# Patient Record
Sex: Female | Born: 1935 | Race: White | Hispanic: No | Smoking: Former smoker
Health system: Southern US, Community
[De-identification: ages and names within clinical notes are randomized; demographics above are authoritative.]

## PROBLEM LIST (undated history)

## (undated) DIAGNOSIS — I10 Essential (primary) hypertension: Secondary | ICD-10-CM

## (undated) DIAGNOSIS — E039 Hypothyroidism, unspecified: Secondary | ICD-10-CM

## (undated) DIAGNOSIS — Z87448 Personal history of other diseases of urinary system: Secondary | ICD-10-CM

## (undated) DIAGNOSIS — E785 Hyperlipidemia, unspecified: Secondary | ICD-10-CM

## (undated) DIAGNOSIS — K219 Gastro-esophageal reflux disease without esophagitis: Secondary | ICD-10-CM

## (undated) DIAGNOSIS — Z9889 Other specified postprocedural states: Secondary | ICD-10-CM

## (undated) DIAGNOSIS — R112 Nausea with vomiting, unspecified: Secondary | ICD-10-CM

## (undated) HISTORY — DX: Essential (primary) hypertension: I10

## (undated) HISTORY — PX: TONSILLECTOMY: SUR1361

## (undated) HISTORY — DX: Gastro-esophageal reflux disease without esophagitis: K21.9

## (undated) HISTORY — DX: Hypothyroidism, unspecified: E03.9

## (undated) HISTORY — DX: Personal history of other diseases of urinary system: Z87.448

## (undated) HISTORY — PX: APPENDECTOMY: SHX54

## (undated) HISTORY — DX: Hyperlipidemia, unspecified: E78.5

## (undated) HISTORY — PX: CHOLECYSTECTOMY: SHX55

## (undated) HISTORY — PX: DILATION AND CURETTAGE OF UTERUS: SHX78

---
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
## 2003-01-09 ENCOUNTER — Emergency Department (HOSPITAL_COMMUNITY): Admission: EM | Admit: 2003-01-09 | Discharge: 2003-01-09 | Payer: Self-pay | Admitting: Emergency Medicine

## 2008-04-21 ENCOUNTER — Ambulatory Visit: Payer: Self-pay | Admitting: Internal Medicine

## 2008-04-21 DIAGNOSIS — E785 Hyperlipidemia, unspecified: Secondary | ICD-10-CM

## 2008-04-21 DIAGNOSIS — E039 Hypothyroidism, unspecified: Secondary | ICD-10-CM

## 2008-04-21 DIAGNOSIS — K219 Gastro-esophageal reflux disease without esophagitis: Secondary | ICD-10-CM | POA: Insufficient documentation

## 2008-08-26 HISTORY — PX: FRACTURE SURGERY: SHX138

## 2008-09-14 ENCOUNTER — Observation Stay (HOSPITAL_COMMUNITY): Admission: EM | Admit: 2008-09-14 | Discharge: 2008-09-15 | Payer: Self-pay | Admitting: Emergency Medicine

## 2009-02-06 ENCOUNTER — Telehealth: Payer: Self-pay | Admitting: Internal Medicine

## 2009-02-17 ENCOUNTER — Ambulatory Visit: Payer: Self-pay | Admitting: Internal Medicine

## 2009-02-17 LAB — CONVERTED CEMR LAB
ALT: 18 units/L (ref 0–35)
Albumin: 3.7 g/dL (ref 3.5–5.2)
BUN: 26 mg/dL — ABNORMAL HIGH (ref 6–23)
Basophils Relative: 0 % (ref 0.0–3.0)
Chloride: 111 meq/L (ref 96–112)
Cholesterol: 165 mg/dL (ref 0–200)
Eosinophils Relative: 1.8 % (ref 0.0–5.0)
HCT: 46.1 % — ABNORMAL HIGH (ref 36.0–46.0)
Hemoglobin: 16.1 g/dL — ABNORMAL HIGH (ref 12.0–15.0)
Lymphs Abs: 1.5 10*3/uL (ref 0.7–4.0)
MCV: 90.1 fL (ref 78.0–100.0)
Monocytes Absolute: 0.5 10*3/uL (ref 0.1–1.0)
Neutro Abs: 4.1 10*3/uL (ref 1.4–7.7)
Platelets: 188 10*3/uL (ref 150.0–400.0)
Potassium: 4.2 meq/L (ref 3.5–5.1)
RBC: 5.11 M/uL (ref 3.87–5.11)
TSH: 1.16 microintl units/mL (ref 0.35–5.50)
Total Protein: 6.6 g/dL (ref 6.0–8.3)
WBC: 6.2 10*3/uL (ref 4.5–10.5)

## 2009-05-13 ENCOUNTER — Telehealth: Payer: Self-pay | Admitting: Internal Medicine

## 2009-10-09 ENCOUNTER — Encounter: Payer: Self-pay | Admitting: Internal Medicine

## 2009-10-12 ENCOUNTER — Ambulatory Visit: Payer: Self-pay | Admitting: Internal Medicine

## 2010-03-11 ENCOUNTER — Ambulatory Visit: Payer: Self-pay | Admitting: Internal Medicine

## 2010-05-19 ENCOUNTER — Ambulatory Visit: Payer: Self-pay | Admitting: Internal Medicine

## 2010-05-19 DIAGNOSIS — N390 Urinary tract infection, site not specified: Secondary | ICD-10-CM | POA: Insufficient documentation

## 2010-05-19 LAB — CONVERTED CEMR LAB
Nitrite: POSITIVE
Specific Gravity, Urine: >=1.03
Urobilinogen, UA: 0.2
WBC Urine, dipstick: NEGATIVE

## 2010-06-14 ENCOUNTER — Telehealth: Payer: Self-pay | Admitting: Internal Medicine

## 2010-06-15 ENCOUNTER — Ambulatory Visit: Payer: Self-pay | Admitting: Internal Medicine

## 2010-06-15 LAB — CONVERTED CEMR LAB
Ketones, urine, test strip: NEGATIVE
Nitrite: NEGATIVE
Protein, U semiquant: 30
Urobilinogen, UA: 0.2

## 2010-06-28 ENCOUNTER — Ambulatory Visit: Payer: Self-pay | Admitting: Internal Medicine

## 2010-06-28 LAB — CONVERTED CEMR LAB
Bilirubin Urine: NEGATIVE
Urobilinogen, UA: 1
pH: 5.5

## 2010-07-27 DIAGNOSIS — Z87448 Personal history of other diseases of urinary system: Secondary | ICD-10-CM

## 2010-07-27 HISTORY — DX: Personal history of other diseases of urinary system: Z87.448

## 2010-10-26 NOTE — Assessment & Plan Note (Signed)
Summary: burning urination//ccm   Vital Signs:  Patient profile:   77 year old female Weight:      132 pounds Temp:     97.5 degrees F oral BP sitting:   160 / 90  (left arm) Cuff size:   regular  Vitals Entered By: Kathrynn Speed CMA (May 19, 2010 10:22 AM) CC: burning urination, for one day, got up three times during the night,src Is Patient Diabetic? No   CC:  burning urination, for one day, got up three times during the night, and src.  History of Present Illness: a 77 year old patient who presents with a two day history of burning, dysuria and frequency.  Symptoms actually are improved today.  She has had some lower mid abdominal discomfort, however, she will be leaving for a trip to [place] soon  and was concerned about a urinary tract infection. She has treated hypertension, dyslipidemia, and hypothyroidism, which have been stable  Current Medications (verified): 1)  Synthroid 112 Mcg  Tabs (Levothyroxine Sodium) .Marland Kitchen.. 1 Once Daily 2)  Simvastatin 10 Mg Tabs (Simvastatin) .... One Daily 3)  Diovan 320 Mg Tabs (Valsartan) .... One Half Tablet Daily 4)  Omeprazole 20 Mg Cpdr (Omeprazole) .... One Daily  Allergies (verified): No Known Drug Allergies  Past History:  Past Medical History: Reviewed history from 04/21/2008 and no changes required. GERD Hyperlipidemia Hypertension Hypothyroidism  Past Surgical History: Appendectomy age 21 Oophorectomy 1954 Tonsillectomy at 10 Cholecystectomy age 32 multiple D&Cs  surgery for fracture, left wrist December 2009 colonoscopy  2006  Social History: Reviewed history from 04/21/2008 and no changes required. widowed August 2011 3 adult children  Review of Systems       The patient complains of depression.  The patient denies anorexia, fever, weight loss, weight gain, vision loss, decreased hearing, hoarseness, chest pain, syncope, dyspnea on exertion, peripheral edema, prolonged cough, headaches, hemoptysis,  abdominal pain, melena, hematochezia, severe indigestion/heartburn, hematuria, incontinence, genital sores, muscle weakness, suspicious skin lesions, transient blindness, difficulty walking, unusual weight change, abnormal bleeding, enlarged lymph nodes, angioedema, and breast masses.    Physical Exam  General:  Well-developed,well-nourished,in no acute distress; alert,appropriate and cooperative throughout examination; 140/80 Head:  Normocephalic and atraumatic without obvious abnormalities. No apparent alopecia or balding. Mouth:  Oral mucosa and oropharynx without lesions or exudates.  Teeth in good repair. Neck:  No deformities, masses, or tenderness noted. Lungs:  Normal respiratory effort, chest expands symmetrically. Lungs are clear to auscultation, no crackles or wheezes. Heart:  Normal rate and regular rhythm. S1 and S2 normal without gallop, murmur, click, rub or other extra sounds. Abdomen:  mild suprapubic tenderness Msk:  No deformity or scoliosis noted of thoracic or lumbar spine.   Pulses:  R and L carotid,radial,femoral,dorsalis pedis and posterior tibial pulses are full and equal bilaterally Extremities:  No clubbing, cyanosis, edema, or deformity noted with normal full range of motion of all joints.     Impression & Recommendations:  Problem # 1:  UTI (ICD-599.0)  Her updated medication list for this problem includes:    Ciprofloxacin Hcl 500 Mg Tabs (Ciprofloxacin hcl) ..... One twice daily  Problem # 2:  HYPERTENSION (ICD-401.9)  Her updated medication list for this problem includes:    Diovan 320 Mg Tabs (Valsartan) ..... One half tablet daily  Complete Medication List: 1)  Synthroid 112 Mcg Tabs (Levothyroxine sodium) .Marland Kitchen.. 1 once daily 2)  Simvastatin 10 Mg Tabs (Simvastatin) .... One daily 3)  Diovan 320 Mg Tabs (Valsartan) .Marland KitchenMarland KitchenMarland Kitchen  One half tablet daily 4)  Omeprazole 20 Mg Cpdr (Omeprazole) .... One daily 5)  Ciprofloxacin Hcl 500 Mg Tabs (Ciprofloxacin hcl) ....  One twice daily  Patient Instructions: 1)  Please schedule a follow-up appointment in 3 months for CPX 2)  Advised not to eat any food or drink any liquids after 10 PM the night before your procedure. 3)  Limit your Sodium (Salt) to less than 2 grams a day(slightly less than 1/2 a teaspoon) to prevent fluid retention, swelling, or worsening of symptoms. 4)  It is important that you exercise regularly at least 20 minutes 5 times a week. If you develop chest pain, have severe difficulty breathing, or feel very tired , stop exercising immediately and seek medical attention. Prescriptions: CIPROFLOXACIN HCL 500 MG TABS (CIPROFLOXACIN HCL) one twice daily  #10 x 0   Entered and Authorized by:   Gordy Savers  MD   Signed by:   Gordy Savers  MD on 05/19/2010   Method used:   Print then Give to Patient   RxID:   [id number]   Laboratory Results   Urine Tests  Date/Time Received: May 19, 2010  Date/Time Reported: May 19, 2010   Routine Urinalysis   Color: yellow Appearance: Clear Glucose: negative   (Normal Range: Negative) Bilirubin: negative   (Normal Range: Negative) Ketone: negative   (Normal Range: Negative) Spec. Gravity: >=1.030   (Normal Range: 1.003-1.035) Blood: small   (Normal Range: Negative) pH: 5.0   (Normal Range: 5.0-8.0) Protein: negative   (Normal Range: Negative) Urobilinogen: 0.2   (Normal Range: 0-1) Nitrite: positive   (Normal Range: Negative) Leukocyte Esterace: negative   (Normal Range: Negative)    Comments: Kathrynn Speed CMA  May 19, 2010 10:58 AM

## 2010-10-26 NOTE — Assessment & Plan Note (Signed)
Summary: fu on meds/njr   Vital Signs:  Woods profile:   77 year old female Weight:      136 pounds BMI:     22.03 Temp:     97.6 degrees F oral BP sitting:   112 / 66  (left arm) Cuff size:   regular  Vitals Entered By: Raechel Ache, RN (October 12, 2009 1:09 PM) CC: ROV   CC:  ROV.  History of Present Illness: Dawn Woods who is seen today for follow-up of her hypertension.  She has dyslipidemia and hypothyroidism.  She is doing quite well.  No concerns or complaints.  She denies any cardiopulmonary complaints  Allergies: No Known Drug Allergies  Past History:  Past Medical History: Reviewed history from 04/21/2008 and no changes required. GERD Hyperlipidemia Hypertension Hypothyroidism  Past Surgical History: Reviewed history from 02/17/2009 and no changes required. Appendectomy age 60 Oophorectomy 1954 Tonsillectomy at 55 Cholecystectomy age 66 multiple D&Cs  surgery for fracture, left wrist December 2009 coloscopy 2006  Family History: Reviewed history from 04/21/2008 and no changes required. father died age 23 M. I. mother died age 46  pneumonia  One sister, age 18.  thyroid disease  Review of Systems  The Woods denies anorexia, fever, weight loss, weight gain, vision loss, decreased hearing, hoarseness, chest pain, syncope, dyspnea on exertion, peripheral edema, prolonged cough, headaches, hemoptysis, abdominal pain, melena, hematochezia, severe indigestion/heartburn, hematuria, incontinence, genital sores, muscle weakness, suspicious skin lesions, transient blindness, difficulty walking, depression, unusual weight change, abnormal bleeding, enlarged lymph nodes, angioedema, and breast masses.    Physical Exam  General:  Well-developed,well-nourished,in no acute distress; alert,appropriate and cooperative throughout examination Head:  Normocephalic and atraumatic without obvious abnormalities. No apparent alopecia or balding. Eyes:  No  corneal or conjunctival inflammation noted. EOMI. Perrla. Funduscopic exam benign, without hemorrhages, exudates or papilledema. Vision grossly normal. Mouth:  Oral mucosa and oropharynx without lesions or exudates.  Teeth in good repair. Neck:  No deformities, masses, or tenderness noted. Lungs:  Normal respiratory effort, chest expands symmetrically. Lungs are clear to auscultation, no crackles or wheezes. Heart:  Normal rate and regular rhythm. S1 and S2 normal without gallop, murmur, click, rub or other extra sounds. Abdomen:  Bowel sounds positive,abdomen soft and non-tender without masses, organomegaly or hernias noted.   Impression & Recommendations:  Problem # 1:  HYPOTHYROIDISM (ICD-244.9)  Her updated medication list for this problem includes:    Synthroid 112 Mcg Tabs (Levothyroxine sodium) .Marland Kitchen... 1 once daily  Her updated medication list for this problem includes:    Synthroid 112 Mcg Tabs (Levothyroxine sodium) .Marland Kitchen... 1 once daily  Problem # 2:  HYPERTENSION (ICD-401.9)  The following medications were removed from the medication list:    Diovan 160 Mg Tabs (Valsartan) ..... One daily. Her updated medication list for this problem includes:    Diovan 320 Mg Tabs (Valsartan) ..... One half tablet daily  Her updated medication list for this problem includes:    Diovan 160 Mg Tabs (Valsartan) ..... One daily.  Orders: Prescription Created Electronically [id number])  Problem # 3:  HYPERLIPIDEMIA (ICD-272.4)  Her updated medication list for this problem includes:    Simvastatin 10 Mg Tabs (Simvastatin) ..... One daily  Her updated medication list for this problem includes:    Simvastatin 10 Mg Tabs (Simvastatin) ..... One daily  Complete Medication List: 1)  Synthroid 112 Mcg Tabs (Levothyroxine sodium) .Marland Kitchen.. 1 once daily 2)  Simvastatin 10 Mg Tabs (Simvastatin) .... One daily 3)  Diovan 320 Mg Tabs (Valsartan) .... One half tablet daily  [facility] Instructions: 1)  Please  schedule a follow-up appointment in 6 months. 2)  Limit your Sodium (Salt). 3)  It is important that you exercise regularly at least 20 minutes 5 times a week. If you develop chest pain, have severe difficulty breathing, or feel very tired , stop exercising immediately and seek medical attention. Prescriptions: SIMVASTATIN 10 MG TABS (SIMVASTATIN) one daily  #90 x 6   Entered and Authorized by:   Gordy Savers  MD   Signed by:   Gordy Savers  MD on 10/12/2009   Method used:   Print then Give to Woods   RxID:   [id number] DIOVAN 320 MG TABS (VALSARTAN) one half tablet daily  #90 x 6   Entered and Authorized by:   Gordy Savers  MD   Signed by:   Gordy Savers  MD on 10/12/2009   Method used:   Print then Give to Woods   RxID:   [id number] SYNTHROID 112 MCG  TABS (LEVOTHYROXINE SODIUM) 1 once daily  #90 x 6   Entered and Authorized by:   Gordy Savers  MD   Signed by:   Gordy Savers  MD on 10/12/2009   Method used:   Print then Give to Woods   RxID:   [id number] DIOVAN 320 MG TABS (VALSARTAN) one half tablet daily  #90 x 6   Entered and Authorized by:   Gordy Savers  MD   Signed by:   Gordy Savers  MD on 10/12/2009   Method used:   Electronically to        [facility]* (mail-order)             ,          Ph: [phone]       Fax: [phone]   RxID:   [id number] SIMVASTATIN 10 MG TABS (SIMVASTATIN) one daily  #90 x 6   Entered and Authorized by:   Gordy Savers  MD   Signed by:   Gordy Savers  MD on 10/12/2009   Method used:   Electronically to        [facility]* (mail-order)             ,          Ph: [phone]       Fax: [phone]   RxID:   [id number] SYNTHROID 112 MCG  TABS (LEVOTHYROXINE SODIUM) 1 once daily  #90 x 6   Entered and Authorized by:   Gordy Savers  MD   Signed by:   Gordy Savers  MD on 10/12/2009   Method used:   Electronically to         [facility]* (mail-order)             ,          Ph: [phone]       Fax: [phone]   RxID:   [id number]

## 2010-10-26 NOTE — Assessment & Plan Note (Signed)
Summary: ?bladder inf/headache/cjr   Vital Signs:  Patient profile:   77 year old female Weight:      130 pounds BP sitting:   118 / 80  (left arm) Cuff size:   regular  Vitals Entered By: Duard Brady LPN (June 15, 2010 9:06 AM) CC: c/o buring with urination Is Patient Diabetic? No   CC:  c/o buring with urination.  History of Present Illness: 77 year old patient who presents with a 4-day history of burning, dysuria, and some frequency.  Yesterday, she noted blood-tinged urine.  She has had urinary tract infections in the past.  At the present time.  She denies any dysuria.  A UA was reviewed today that revealed moderate blood, but no leukocytes.  She was treated last month with Cipro, which caused hives.  She has treated hypertension, which has been stable  Allergies: 1)  ! Cipro  Past History:  Past Medical History: Reviewed history from 04/21/2008 and no changes required. GERD Hyperlipidemia Hypertension Hypothyroidism  Physical Exam  General:  Well-developed,well-nourished,in no acute distress; alert,appropriate and cooperative throughout examination Abdomen:  Bowel sounds positive,abdomen soft and non-tender without masses, organomegaly or hernias noted.   Impression & Recommendations:  Problem # 1:  UTI (ICD-599.0)  The following medications were removed from the medication list:    Ciprofloxacin Hcl 500 Mg Tabs (Ciprofloxacin hcl) ..... One twice daily Her updated medication list for this problem includes:    Nitrofurantoin Monohyd Macro 100 Mg Caps (Nitrofurantoin monohyd macro) ..... One twice daily suspect the hematuria is related to a hemorrhagic cystitis ; will treat with antibiotic therapy and asked the patient to return in 4 weeks to document the resolution of hematuria  Problem # 2:  HYPERTENSION (ICD-401.9)  Her updated medication list for this problem includes:    Diovan 320 Mg Tabs (Valsartan) ..... One half tablet daily  Her updated  medication list for this problem includes:    Diovan 320 Mg Tabs (Valsartan) ..... One half tablet daily  Complete Medication List: 1)  Synthroid 112 Mcg Tabs (Levothyroxine sodium) .Marland Kitchen.. 1 once daily 2)  Simvastatin 10 Mg Tabs (Simvastatin) .... One daily 3)  Diovan 320 Mg Tabs (Valsartan) .... One half tablet daily 4)  Omeprazole 20 Mg Cpdr (Omeprazole) .... One daily 5)  Nitrofurantoin Monohyd Macro 100 Mg Caps (Nitrofurantoin monohyd macro) .... One twice daily  Other Orders: UA Dipstick w/o Micro (manual) ([id number])  Patient Instructions: 1)  Take your antibiotic as prescribed until ALL of it is gone, but stop if you develop a rash or swelling and contact our office as soon as possible. 2)  recheck a urinalysis in 4 weeks to document clearance of the hematuria Prescriptions: NITROFURANTOIN MONOHYD MACRO 100 MG CAPS (NITROFURANTOIN MONOHYD MACRO) one twice daily  #20 x 0   Entered and Authorized by:   Gordy Savers  MD   Signed by:   Gordy Savers  MD on 06/15/2010   Method used:   Print then Give to Patient   RxID:   [id number]   Laboratory Results   Urine Tests  Date/Time Received: June 15, 2010 9:21 AM  Date/Time Reported: June 15, 2010 9:21 AM   Routine Urinalysis   Color: yellow Appearance: Hazy Glucose: negative   (Normal Range: Negative) Bilirubin: negative   (Normal Range: Negative) Ketone: negative   (Normal Range: Negative) Spec. Gravity: >=1.030   (Normal Range: 1.003-1.035) Blood: moderate   (Normal Range: Negative) pH: 5.0   (Normal Range: 5.0-8.0) Protein: 30   (  Normal Range: Negative) Urobilinogen: 0.2   (Normal Range: 0-1) Nitrite: negative   (Normal Range: Negative) Leukocyte Esterace: negative   (Normal Range: Negative)

## 2010-10-26 NOTE — Letter (Signed)
Summary: Request for Medical Alert Certificate/[employer]  Request for Medical Alert Certificate/[employer]   Imported By: Maryln Gottron 10/12/2009 15:45:41  _____________________________________________________________________  External Attachment:    Type:   Image     Comment:   External Document

## 2010-10-26 NOTE — Assessment & Plan Note (Signed)
Summary: body aches//ccm   Vital Signs:  Patient profile:   77 year old female Weight:      132 pounds Temp:     98.1 degrees F oral BP sitting:   110 / 70  (right arm) Cuff size:   regular  Vitals Entered By: Duard Brady LPN (March 11, 2010 12:49 PM) CC: c/o upper body pain Is Patient Diabetic? No   CC:  c/o upper body pain.  History of Present Illness: 77 year old patient who states she has a history of gastroesophageal reflux disease.  Approximately 3 weeks ago, she states that following  digestion.  The A. large quantity of pain that she began having reflux symptoms.  She describes upper chest and back pain.  She states that after ingestion of especially vegetables.  She has dyspepsia associated with considerable belching.  There's been no nausea or vomiting.  She has cardiac risk factors, but no history of exertional chest pain  Allergies (verified): No Known Drug Allergies  Past History:  Past Medical History: Reviewed history from 04/21/2008 and no changes required. GERD Hyperlipidemia Hypertension Hypothyroidism  Review of Systems       The patient complains of severe indigestion/heartburn.  The patient denies anorexia, fever, weight loss, weight gain, vision loss, decreased hearing, hoarseness, chest pain, syncope, dyspnea on exertion, peripheral edema, prolonged cough, headaches, hemoptysis, abdominal pain, melena, hematochezia, hematuria, incontinence, genital sores, muscle weakness, suspicious skin lesions, transient blindness, difficulty walking, depression, unusual weight change, abnormal bleeding, enlarged lymph nodes, angioedema, and breast masses.    Physical Exam  General:  Well-developed,well-nourished,in no acute distress; alert,appropriate and cooperative throughout examination Head:  Normocephalic and atraumatic without obvious abnormalities. No apparent alopecia or balding. Mouth:  Oral mucosa and oropharynx without lesions or exudates.  Teeth in  good repair. Neck:  No deformities, masses, or tenderness noted. Lungs:  Normal respiratory effort, chest expands symmetrically. Lungs are clear to auscultation, no crackles or wheezes. Heart:  Normal rate and regular rhythm. S1 and S2 normal without gallop, murmur, click, rub or other extra sounds. Abdomen:  Bowel sounds positive,abdomen soft and non-tender without masses, organomegaly or hernias noted.   Impression & Recommendations:  Problem # 1:  GERD (ICD-530.81)  Her updated medication list for this problem includes:    Omeprazole 20 Mg Cpdr (Omeprazole) ..... One daily  Problem # 2:  HYPERLIPIDEMIA (ICD-272.4)  Her updated medication list for this problem includes:    Simvastatin 10 Mg Tabs (Simvastatin) ..... One daily  Complete Medication List: 1)  Synthroid 112 Mcg Tabs (Levothyroxine sodium) .Marland Kitchen.. 1 once daily 2)  Simvastatin 10 Mg Tabs (Simvastatin) .... One daily 3)  Diovan 320 Mg Tabs (Valsartan) .... One half tablet daily 4)  Omeprazole 20 Mg Cpdr (Omeprazole) .... One daily  Patient Instructions: 1)  Please schedule a follow-up appointment in 3 months. 2)  Avoid foods high in acid (tomatoes, citrus juices, spicy foods). Avoid eating within two hours of lying down or before exercising. Do not over eat; try smaller more frequent meals. Elevate head of bed twelve inches when sleeping. Prescriptions: OMEPRAZOLE 20 MG CPDR (OMEPRAZOLE) one daily  #90 x 3   Entered and Authorized by:   Gordy Savers  MD   Signed by:   Gordy Savers  MD on 03/11/2010   Method used:   Electronically to        [facility]* (retail)             ,  Ph: [phone]       Fax: [phone]   RxID:   [id number] DIOVAN 320 MG TABS (VALSARTAN) one half tablet daily  #90 x 6   Entered and Authorized by:   Gordy Savers  MD   Signed by:   Gordy Savers  MD on 03/11/2010   Method used:   Electronically to        [facility]* (retail)             ,           Ph: [phone]       Fax: [phone]   RxID:   [id number] SIMVASTATIN 10 MG TABS (SIMVASTATIN) one daily  #90 x 6   Entered and Authorized by:   Gordy Savers  MD   Signed by:   Gordy Savers  MD on 03/11/2010   Method used:   Electronically to        [facility]* (retail)             ,          Ph: [phone]       Fax: [phone]   RxID:   [id number] SYNTHROID 112 MCG  TABS (LEVOTHYROXINE SODIUM) 1 once daily  #90 x 6   Entered and Authorized by:   Gordy Savers  MD   Signed by:   Gordy Savers  MD on 03/11/2010   Method used:   Electronically to        [facility]* (retail)             ,          Ph: [phone]       Fax: [phone]   RxID:   [id number]

## 2010-10-26 NOTE — Assessment & Plan Note (Signed)
Summary: UA/RCD//per kim resched to dr. K/cb   Vital Signs:  Patient profile:   77 year old female Weight:      129 pounds BP sitting:   120 / 74  (right arm) Cuff size:   large  Vitals Entered By: Duard Brady LPN (June 28, 2010 9:24 AM)  History of Present Illness: 77 year old patient who is seen today for follow-up of an episode of gross hematuria  in the setting of a recent urinary tract infection.  She has had no further dysuria or hematuria.  She feels well today.  She was seen today for a follow-up UA reveals perhaps trace hematuria.  She has treated hypertension.  she wishes to have her Diovan prescription changed to 160 mg, so she doesn't have to break the 320 mg and half.  Allergies: 1)  ! Cipro  Past History:  Past Medical History: GERD Hyperlipidemia Hypertension Hypothyroidism history of hematuria September 2011  Review of Systems  The patient denies anorexia, fever, weight loss, weight gain, vision loss, decreased hearing, hoarseness, chest pain, syncope, dyspnea on exertion, peripheral edema, prolonged cough, headaches, hemoptysis, abdominal pain, melena, hematochezia, severe indigestion/heartburn, hematuria, incontinence, genital sores, muscle weakness, suspicious skin lesions, transient blindness, difficulty walking, depression, unusual weight change, abnormal bleeding, enlarged lymph nodes, angioedema, and breast masses.    Physical Exam  General:  Well-developed,well-nourished,in no acute distress; alert,appropriate and cooperative throughout examination; blood   pressure 120/74   Impression & Recommendations:  Problem # 1:  HYPERTENSION (ICD-401.9)  The following medications were removed from the medication list:    Diovan 320 Mg Tabs (Valsartan) ..... One half tablet daily Her updated medication list for this problem includes:    Diovan 160 Mg Tabs (Valsartan) ..... One daily  The following medications were removed from the medication list:    Diovan 320 Mg Tabs (Valsartan) ..... One half tablet daily Her updated medication list for this problem includes:    Diovan 160 Mg Tabs (Valsartan) ..... One daily  Problem # 2:  HEMATURIA, HX OF (ICD-V13.09)  Complete Medication List: 1)  Synthroid 112 Mcg Tabs (Levothyroxine sodium) .Marland Kitchen.. 1 once daily 2)  Simvastatin 10 Mg Tabs (Simvastatin) .... One daily 3)  Omeprazole 20 Mg Cpdr (Omeprazole) .... One daily 4)  Nitrofurantoin Monohyd Macro 100 Mg Caps (Nitrofurantoin monohyd macro) .... One twice daily 5)  Diovan 160 Mg Tabs (Valsartan) .... One daily  Patient Instructions: 1)  Please schedule a follow-up appointment in 3 months. 2)  Limit your Sodium (Salt) to less than 2 grams a day(slightly less than 1/2 a teaspoon) to prevent fluid retention, swelling, or worsening of symptoms. 3)  It is important that you exercise regularly at least 20 minutes 5 times a week. If you develop chest pain, have severe difficulty breathing, or feel very tired , stop exercising immediately and seek medical attention. Prescriptions: DIOVAN 160 MG TABS (VALSARTAN) one daily  #90 x 6   Entered and Authorized by:   Gordy Savers  MD   Signed by:   Gordy Savers  MD on 06/28/2010   Method used:   Electronically to        [facility]* (retail)             ,          Ph: [phone]       Fax: [phone]   RxID:   [id number]   Laboratory Results   Urine Tests    Routine Urinalysis  Color: yellow Appearance: Clear Glucose: negative   (Normal Range: Negative) Bilirubin: negative   (Normal Range: Negative) Ketone: trace (5)   (Normal Range: Negative) Spec. Gravity: 1.025   (Normal Range: 1.003-1.035) Blood: trace-intact   (Normal Range: Negative) pH: 5.5   (Normal Range: 5.0-8.0) Protein: trace   (Normal Range: Negative) Urobilinogen: 1.0   (Normal Range: 0-1) Nitrite: negative   (Normal Range: Negative) Leukocyte Esterace: negative   (Normal Range: Negative)      Comments: Rita Ohara  June 28, 2010 9:19 AM

## 2010-10-26 NOTE — Progress Notes (Signed)
Summary: Call-A-Nurse Report    Call-A-Nurse Triage Call Report Triage Record Num: [id number] Operator: Revonda Humphrey Patient Name: Dawn Woods Call Date & Time: 06/12/2010 9:34:29AM Patient Phone: [phone] PCP: Patient Gender: Female PCP Fax : Patient DOB: 1930/02/11 [facility] Name: [facility] Reason for Call: Patient calling about urine Sx, started pain with urination last night 06/11/10, burning with urination. Afebrile. "Thinks UTI". Declines appt at [facility], referred to [facility] Gave care information. Protocol(s) Used: Urinary Symptoms - Female Recommended Outcome per Protocol: See Anderson Coppock within 24 hours Reason for Outcome: Has one or more urinary tract symptoms Care Advice:  ~ Call Toddrick Sanna if you develop flank or low back pain, fever, generally feel sick. Increase intake of fluids. Try to drink 8 oz. (.2 liter) every hour when awake, including unsweetened cranberry juice, unless on restricted fluids for other medical reasons. Take sips of fluid or eat ice chips if nauseated or vomiting.  ~ Limit carbonated, alcoholic, and caffeinated beverages such as coffee, tea and soda. Avoid nonprescription cold and allergy medications that contain caffeine. Limit intake of tomatoes, fruit juices (except for unsweetened cranberry juice), dairy products, spicy foods, sugar, and artificial sweeteners (aspartame or saccharine). Stop or decrease smoking. Reducing exposure to bladder irritants may help lessen urgency.  ~  ~ Call Artin Mceuen if urine is pink, red, smoky or cola colored. Systemic Inflammatory Response Syndrome (SIRS): Watch for signs of a generalized, whole body infection. Occurs within days of a localized infection, especially of the urinary, GI, respiratory or nervous systems; or after a traumatic injury or invasive procedure. - Call EMS 911 if symptoms have worsened, such as increasing confusion or unusual drowsiness; cold and clammy skin; no urine output;  rapid respiration (>30/min.) or slow respiration (<10/min.); struggling to breathe. - Go to the ED immediately for early symptoms of rapid pulse >90/min. or rapid breathing >20/min. at rest; chills; oral temperature >100.4 F (38 C) or <96.8 F (36 C) when associated with conditions noted.  ~ 06/12/2010 9:45:58AM Page 1 of 1 [id number]

## 2010-11-15 ENCOUNTER — Encounter: Payer: Self-pay | Admitting: Internal Medicine

## 2010-11-16 ENCOUNTER — Encounter: Payer: Self-pay | Admitting: Internal Medicine

## 2010-11-16 ENCOUNTER — Ambulatory Visit (INDEPENDENT_AMBULATORY_CARE_PROVIDER_SITE_OTHER): Payer: 59 | Admitting: Internal Medicine

## 2010-11-16 DIAGNOSIS — R197 Diarrhea, unspecified: Secondary | ICD-10-CM

## 2010-11-16 DIAGNOSIS — I10 Essential (primary) hypertension: Secondary | ICD-10-CM

## 2010-11-16 DIAGNOSIS — E785 Hyperlipidemia, unspecified: Secondary | ICD-10-CM

## 2010-11-16 MED ORDER — DIPHENOXYLATE-ATROPINE 2.5-0.025 MG PO TABS: 1.0000 | ORAL_TABLET | Freq: Four times a day (QID) | ORAL | Status: AC | PRN

## 2010-11-16 NOTE — Patient Instructions (Signed)
Call or return to clinic prn if these symptoms worsen or fail to improve as anticipated.   consider high fiber diet

## 2010-11-16 NOTE — Progress Notes (Signed)
  Subjective:    Patient ID: Dawn Woods, female    DOB: [birth year], 77 y.o.   MRN: [id number]  HPI  77 year old patient, who presents with a two week history of loose diarrheal stool.  These occur 3 to 4 times daily, but are not associated with incontinence.  The patient has tried Imodium A-D without much benefit.  There is no anorexia, nausea or vomiting, or abdominal pain.  There's been no distention.  Denies any fever    Review of Systems  Constitutional: Negative.   HENT: Negative for hearing loss, congestion, sore throat, rhinorrhea, dental problem, sinus pressure and tinnitus.   Eyes: Negative for pain, discharge and visual disturbance.  Respiratory: Negative for cough and shortness of breath.   Cardiovascular: Negative for chest pain, palpitations and leg swelling.  Gastrointestinal: Positive for diarrhea. Negative for nausea, vomiting, abdominal pain, constipation, blood in stool and abdominal distention.  Genitourinary: Negative for dysuria, urgency, frequency, hematuria, flank pain, vaginal bleeding, vaginal discharge, difficulty urinating, vaginal pain and pelvic pain.  Musculoskeletal: Negative for joint swelling, arthralgias and gait problem.  Skin: Negative for rash.  Neurological: Negative for dizziness, syncope, speech difficulty, weakness, numbness and headaches.  Hematological: Negative for adenopathy.  Psychiatric/Behavioral: Negative for behavioral problems, dysphoric mood and agitation. The patient is not nervous/anxious.        Objective:   Physical Exam  Constitutional: She is oriented to person, place, and time. She appears well-developed and well-nourished.  HENT:  Head: Normocephalic.  Right Ear: External ear normal.  Left Ear: External ear normal.  Mouth/Throat: Oropharynx is clear and moist.  Eyes: Conjunctivae and EOM are normal. Pupils are equal, round, and reactive to light.  Neck: Normal range of motion. Neck supple. No thyromegaly present.    Cardiovascular: Normal rate, regular rhythm, normal heart sounds and intact distal pulses.   Pulmonary/Chest: Effort normal and breath sounds normal.  Abdominal: Soft. Bowel sounds are normal. She exhibits no mass. There is no tenderness. There is no rebound and no guarding.  Musculoskeletal: Normal range of motion.  Lymphadenopathy:    She has no cervical adenopathy.  Neurological: She is alert and oriented to person, place, and time.  Skin: Skin is warm and dry. No rash noted.  Psychiatric: She has a normal mood and affect. Her behavior is normal.          Assessment & Plan:  Diarrhea-  Will treat with Align and clinically observed.  She was also given a small dose of an antidiarrheal.  I suggested a high fiber diet

## 2011-02-08 NOTE — Op Note (Signed)
Dawn Woods, Dawn Woods           ACCOUNT NO.:  [id number]   MEDICAL RECORD NO.:  [id number]          PATIENT TYPE:  INP   LOCATION:  0107                         FACILITY:  [facility]   PHYSICIAN:  Madelynn Done, MD  DATE OF BIRTH:  November 30, 1929   DATE OF PROCEDURE:  09/14/2008  DATE OF DISCHARGE:                               OPERATIVE REPORT   PREOPERATIVE DIAGNOSIS:  Left wrist intra-articular distal radius  fracture, 3 or more fragments.   POSTOPERATIVE DIAGNOSES:  1. Tobacco use.  2. Osteoporosis.  3. Fall.  4. Left wrist intra-articular distal radius fracture, 3 or more      fragments.   ATTENDING SURGEON:  Madelynn Done, MD, who was scrubbed and present  the entire procedure.   ASSISTANT SURGEON:  None.   SURGICAL PROCEDURE:  1. Open treatment of left wrist intra-articular distal radius fracture      of 3 or more fragments.  2. Stress radiography, 3 views x-ray of left wrist.   SURGICAL IMPLANTS:  1. [employer] volar distal radius plate, with 7 locking pegs      distally and 3 bicortical screws proximally.  2. Orthoblast 5 mL DBX putty.   INTRAOPERATIVE FINDINGS:  The patient did have a comminuted intra-  articular fracture of the distal radius.  She did have a large  metaphyseal void; therefore the bone graft substitute was used.  The  patient did not have any instability to her distal radial ulnar joint or  any intercarpal widening after fixation of the distal radius.   INDICATIONS:  Ms. Scarfone is a 77 year old female who fell while  taking out the trash; she sustained a closed injury to her left distal  radius.  The patient was seen and evaluated in the emergency department.  She underwent a closed manipulation and continued to have displacement  following closed methods.  After options were discussed with her, we  elected to proceed with the above procedure.  The risks, benefits and  alternatives were discussed in detail with the patient.  A  signed  informed consent was obtained.  The risks include, but not limited to:  bleeding, infection, damage to nearby nerves, tendons and arteries;  nonunion, malunion, reaction to the implant, loss of motion of the wrist  and forearm, and possible need for further surgical intervention.  All  questions were addressed prior to surgery.   DESCRIPTION OF PROCEDURE:  The patient was brought up and identified in  the preoperative holding area; made a mark on the left upper extremity  to indicate the correct operative site.  The patient was then brought  back to the operating room, placed supine on the anesthesia table --  there general anesthesia was administered via endotracheal tube.  The  patient tolerated this well.  The patient had undergone also axillary  block anesthesia performed by Dr. Quentin Cornwall. Denenny prior to the  induction of anesthesia.  The patient tolerated this well.  A well-  padded tourniquet was then placed on the left brachium and sealed with  1000 drape.  The left upper extremity was then prepped with  Betadine and  sterilely draped.  A timeout was called; correct side was then  identified and the procedure was then begun.   The limb was then elevated using Esmarch exsanguination with tourniquet  inflated to 250 mmHg.  The patient received preoperative antibiotics  prior to any skin incision.   A longitudinal incision was then made directly over the FCR tendon  sheath.  Dissection was carried down through the skin and subcutaneous  tissues.  Hemostasis was obtained with bipolar cautery.  The FCR sheath  was then opened proximally and distally.  The FPL was then identified.  The pronator quadratus was then elevated in an L-shaped fashion; this  exposed the fracture site.  There was an intra-articular extension both  volarly and dorsally.  There were 3 or more fragments.   An open reduction was then performed.  The patient did have a large  cortical window of bone which  was able to be elevated; and therefore the  bone graft substitute was then packed from a volar to dorsal direction,  with good fill in the metaphyseal region.  After placement of the  Orthoblast bone graft substitute, an open reduction was then performed.  The volar plate was then applied and then affixed with the oblong screw  hole proximally.  The plate was then adjusted to appropriate height and  width.  Following application of the plate, a mini C-arm was then used  to confirm appropriate alignment of the fracture site as well as the  plate.   Attention was then turned distally.  The distal fixation was then begun,  beginning in an ulnar-to-radial direction using a locking peg awl.  Smooth locking pegs were then used at the appropriate drill guide and  depth gauge measurement.  After the distal fixation, attention was then  turned proximally; where 2 more bicortical screws were then placed with  good purchase.  After the implant was then passed into the radius,  stress radiography was then carried out in AP, lateral and oblique  images; as well as stressing the wrist joint, to make sure there was not  any intercarpal widening or instability to the distal radioulnar joint.   The wounds were then thoroughly irrigated.  The pronator quadratus flap  was then repaired with 2-0 Vicryl suture.  The tourniquet was deflated  and hemostasis then obtained with saline irrigation and direct pressure.  Subcutaneous tissues were closed with 4-0 Vicryl and the skin closed  with a horizontal running 4-0 nylon suture.  Adaptic dressing was then  applied and a sterile compressive dressing was then applied.  The  patient was then placed in a well-padded sugar-tong splint.  She was  extubated, taken to recovery room in good condition.   Intraoperative radiograph reviews of the wrist did show the internal  fixation and plate to have good position of both the distal radius and  distal radial ulnar joint.   Good position of the plate and reduction of  the fracture site.   POSTOPERATIVE PLAN:  The patient will be admitted overnight for IV  antibiotic and pain control.  She will be discharged to home.  She is to  see me back in the office in 10 days for wound check and suture removal;  then x-rays out of the splint and then transitioned to a short-arm cast  for a total of 4 weeks immobilization.  X-rays at the 4-week mark, 6-  week mark, and then begin likely therapy at anywhere at the  6-week mark  -- depending on fracture healing.  We may consider, depending on the  initial radiographs, long-arm immobilization for 3 weeks.      Madelynn Done, MD  Electronically Signed     FWO/MEDQ  D:  09/14/2008  T:  09/15/2008  Job:  [id number]

## 2011-04-06 ENCOUNTER — Ambulatory Visit (INDEPENDENT_AMBULATORY_CARE_PROVIDER_SITE_OTHER): Payer: 59 | Admitting: Internal Medicine

## 2011-04-06 ENCOUNTER — Encounter: Payer: Self-pay | Admitting: Internal Medicine

## 2011-04-06 DIAGNOSIS — I10 Essential (primary) hypertension: Secondary | ICD-10-CM

## 2011-04-06 DIAGNOSIS — E785 Hyperlipidemia, unspecified: Secondary | ICD-10-CM

## 2011-04-06 DIAGNOSIS — E039 Hypothyroidism, unspecified: Secondary | ICD-10-CM

## 2011-04-06 DIAGNOSIS — K219 Gastro-esophageal reflux disease without esophagitis: Secondary | ICD-10-CM

## 2011-04-06 LAB — CBC WITH DIFFERENTIAL/PLATELET
Basophils Relative: 0.5 % (ref 0.0–3.0)
Eosinophils Relative: 0.9 % (ref 0.0–5.0)
HCT: 46.9 % — ABNORMAL HIGH (ref 36.0–46.0)
Hemoglobin: 15.9 g/dL — ABNORMAL HIGH (ref 12.0–15.0)
Lymphs Abs: 1.5 10*3/uL (ref 0.7–4.0)
MCV: 90.7 fl (ref 78.0–100.0)
Monocytes Absolute: 0.5 10*3/uL (ref 0.1–1.0)
Monocytes Relative: 6 % (ref 3.0–12.0)
Neutro Abs: 5.5 10*3/uL (ref 1.4–7.7)
RBC: 5.18 Mil/uL — ABNORMAL HIGH (ref 3.87–5.11)
WBC: 7.6 10*3/uL (ref 4.5–10.5)

## 2011-04-06 LAB — BASIC METABOLIC PANEL
BUN: 31 mg/dL — ABNORMAL HIGH (ref 6–23)
Chloride: 108 mEq/L (ref 96–112)
GFR: 80.43 mL/min (ref >60.00)
Potassium: 4.1 mEq/L (ref 3.5–5.1)
Sodium: 141 mEq/L (ref 135–145)

## 2011-04-06 LAB — TSH: TSH: 0.3 u[IU]/mL — ABNORMAL LOW (ref 0.35–5.50)

## 2011-04-06 LAB — LIPID PANEL
Cholesterol: 169 mg/dL (ref 0–200)
LDL Cholesterol: 79 mg/dL (ref 0–99)
Total CHOL/HDL Ratio: 2
VLDL: 19.2 mg/dL (ref 0.0–40.0)

## 2011-04-06 LAB — HEPATIC FUNCTION PANEL
ALT: 14 U/L (ref 0–35)
AST: 22 U/L (ref 0–37)
Alkaline Phosphatase: 75 U/L (ref 39–117)
Total Bilirubin: 1 mg/dL (ref 0.3–1.2)

## 2011-04-06 MED ORDER — VALSARTAN 160 MG PO TABS: 160.0000 mg | ORAL_TABLET | Freq: Every day | ORAL | Status: DC

## 2011-04-06 NOTE — Patient Instructions (Addendum)
Limit your sodium (Salt) intake  Return in 3 months for follow-up  Call for earlier visit and  chest x-ray if weight loss continues

## 2011-04-06 NOTE — Progress Notes (Signed)
  Subjective:    Patient ID: Dawn Woods, female    DOB: [birth year], 77 y.o.   MRN: [id number]  HPI  Wt Readings from Last 3 Encounters:  04/06/11 123 lb (55.792 kg)  11/16/10 128 lb (58.06 kg)  06/28/10 129 lb (58.67 kg)   77 year old patient who is seen today for followup. She has a history of treated hypertension which has been well on Diovan. She has hypothyroidism. Since her last visit here a number of months ago her weight is down 5 pounds. She does smoke cigarettes about 5 per day. She has gastroesophageal reflux disease which has been stable  In general she feels quite well. She is seen here at the insistence of her daughter said she has not been evaluated in some time. She describes a normal appetite. She has had no recent lab  Review of Systems  Constitutional: Positive for unexpected weight change.  HENT: Negative for hearing loss, congestion, sore throat, rhinorrhea, dental problem, sinus pressure and tinnitus.   Eyes: Negative for pain, discharge and visual disturbance.  Respiratory: Negative for cough and shortness of breath.   Cardiovascular: Negative for chest pain, palpitations and leg swelling.  Gastrointestinal: Negative for nausea, vomiting, abdominal pain, diarrhea, constipation, blood in stool and abdominal distention.  Genitourinary: Negative for dysuria, urgency, frequency, hematuria, flank pain, vaginal bleeding, vaginal discharge, difficulty urinating, vaginal pain and pelvic pain.  Musculoskeletal: Negative for joint swelling, arthralgias and gait problem.  Skin: Negative for rash.  Neurological: Negative for dizziness, syncope, speech difficulty, weakness, numbness and headaches.  Hematological: Negative for adenopathy.  Psychiatric/Behavioral: Negative for behavioral problems, dysphoric mood and agitation. The patient is not nervous/anxious.        Objective:   Physical Exam  Constitutional: She is oriented to person, place, and time. She appears  well-developed and well-nourished.  HENT:  Head: Normocephalic.  Right Ear: External ear normal.  Left Ear: External ear normal.  Mouth/Throat: Oropharynx is clear and moist.  Eyes: Conjunctivae and EOM are normal. Pupils are equal, round, and reactive to light.  Neck: Normal range of motion. Neck supple. No thyromegaly present.  Cardiovascular: Normal rate, regular rhythm, normal heart sounds and intact distal pulses.   Pulmonary/Chest: Effort normal and breath sounds normal.  Abdominal: Soft. Bowel sounds are normal. She exhibits no mass. There is no tenderness.  Musculoskeletal: Normal range of motion.  Lymphadenopathy:    She has no cervical adenopathy.  Neurological: She is alert and oriented to person, place, and time.  Skin: Skin is warm and dry. No rash noted.  Psychiatric: She has a normal mood and affect. Her behavior is normal.          Assessment & Plan:   Hypertension. Well controlled. Will refill Diovan Mod[place] weight loss. We'll check some updated lab and schedule a complete physical. If weight loss continues will obtain a chest x-ray Gastroesophageal reflux disease Hypothyroidism. We'll check a TSH

## 2011-04-08 ENCOUNTER — Other Ambulatory Visit: Payer: Self-pay

## 2011-04-08 MED ORDER — LEVOTHYROXINE SODIUM 75 MCG PO TABS: 75.0000 ug | ORAL_TABLET | Freq: Every day | ORAL | Status: DC

## 2011-04-08 NOTE — Telephone Encounter (Signed)
Pt aware of new rx for synthroid - short term to [facility] and 90 day to [facility]

## 2011-04-08 NOTE — Progress Notes (Signed)
Quick Note:  Pt aware new med to start. Short tern called to [facility] and 90 day to [facility] ______

## 2011-06-13 ENCOUNTER — Other Ambulatory Visit: Payer: Self-pay | Admitting: Internal Medicine

## 2011-06-14 ENCOUNTER — Encounter: Payer: Self-pay | Admitting: Internal Medicine

## 2011-06-14 ENCOUNTER — Ambulatory Visit (INDEPENDENT_AMBULATORY_CARE_PROVIDER_SITE_OTHER): Payer: 59 | Admitting: Internal Medicine

## 2011-06-14 VITALS — BP 132/80

## 2011-06-14 DIAGNOSIS — J069 Acute upper respiratory infection, unspecified: Secondary | ICD-10-CM

## 2011-06-14 DIAGNOSIS — E785 Hyperlipidemia, unspecified: Secondary | ICD-10-CM

## 2011-06-14 DIAGNOSIS — I10 Essential (primary) hypertension: Secondary | ICD-10-CM

## 2011-06-14 NOTE — Progress Notes (Signed)
  Subjective:    Patient ID: Dawn Woods, female    DOB: Sep 26, 1930, 77 y.o.   MRN: [id number]  HPI  77 year old patient who is seen today with a three-day history of head and chest congestion. There's been no purulent sputum production wheezing or shortness of breath. She does have a history of ongoing low volume tobacco use. She is making an effort at smoking cessation. She is concerned about the possible need for antibiotic therapy. She has hypertension and dyslipidemia which has been stable denies any exertional chest pain.    Review of Systems  Constitutional: Negative.   HENT: Positive for congestion and rhinorrhea. Negative for hearing loss, sore throat, dental problem, sinus pressure and tinnitus.   Eyes: Negative for pain, discharge and visual disturbance.  Respiratory: Positive for cough. Negative for shortness of breath.   Cardiovascular: Negative for chest pain, palpitations and leg swelling.  Gastrointestinal: Negative for nausea, vomiting, abdominal pain, diarrhea, constipation, blood in stool and abdominal distention.  Genitourinary: Negative for dysuria, urgency, frequency, hematuria, flank pain, vaginal bleeding, vaginal discharge, difficulty urinating, vaginal pain and pelvic pain.  Musculoskeletal: Negative for joint swelling, arthralgias and gait problem.  Skin: Negative for rash.  Neurological: Negative for dizziness, syncope, speech difficulty, weakness, numbness and headaches.  Hematological: Negative for adenopathy.  Psychiatric/Behavioral: Negative for behavioral problems, dysphoric mood and agitation. The patient is not nervous/anxious.        Objective:   Physical Exam  Constitutional: She is oriented to person, place, and time. She appears well-developed and well-nourished.  HENT:  Head: Normocephalic.  Right Ear: External ear normal.  Left Ear: External ear normal.  Mouth/Throat: Oropharynx is clear and moist.  Eyes: Conjunctivae and EOM are normal.  Pupils are equal, round, and reactive to light.  Neck: Normal range of motion. Neck supple. No thyromegaly present.  Cardiovascular: Normal rate, regular rhythm, normal heart sounds and intact distal pulses.   Pulmonary/Chest: Effort normal and breath sounds normal.  Abdominal: Soft. Bowel sounds are normal. She exhibits no mass. There is no tenderness.  Musculoskeletal: Normal range of motion.  Lymphadenopathy:    She has no cervical adenopathy.  Neurological: She is alert and oriented to person, place, and time.  Skin: Skin is warm and dry. No rash noted.  Psychiatric: She has a normal mood and affect. Her behavior is normal.          Assessment & Plan:    Viral URI. Will treat symptomatically with Mucinex DM. Total cessation of smoking encouraged Hypertension well controlled. We'll continue present regimen Dyslipidemia. Will continue present regimen  Low-salt diet recommended total cessation tobacco products recommended will see in 3 months we'll check a TSH at that time

## 2011-06-14 NOTE — Patient Instructions (Signed)
Get plenty of rest, Drink lots of  clear liquids, and use Tylenol or ibuprofen for fever and discomfort.    Smoking tobacco is very bad for your health. You should stop smoking immediately.  Return in 3 months for follow-up

## 2011-07-01 LAB — CBC
HCT: 46.1 % — ABNORMAL HIGH (ref 36.0–46.0)
MCHC: 33.2 g/dL (ref 30.0–36.0)
MCV: 91.9 fL (ref 78.0–100.0)
Platelets: 214 10*3/uL (ref 150–400)
WBC: 9.9 10*3/uL (ref 4.0–10.5)

## 2011-07-01 LAB — DIFFERENTIAL
Basophils Relative: 0 % (ref 0–1)
Eosinophils Absolute: 0 10*3/uL (ref 0.0–0.7)
Eosinophils Relative: 0 % (ref 0–5)
Lymphs Abs: 0.9 10*3/uL (ref 0.7–4.0)
Monocytes Relative: 4 % (ref 3–12)
Neutrophils Relative %: 87 % — ABNORMAL HIGH (ref 43–77)

## 2011-07-01 LAB — BASIC METABOLIC PANEL
BUN: 25 mg/dL — ABNORMAL HIGH (ref 6–23)
CO2: 28 mEq/L (ref 19–32)
Chloride: 105 mEq/L (ref 96–112)
Creatinine, Ser: 0.67 mg/dL (ref 0.4–1.2)
Potassium: 4.7 mEq/L (ref 3.5–5.1)

## 2011-08-24 ENCOUNTER — Other Ambulatory Visit: Payer: Self-pay | Admitting: Internal Medicine

## 2011-08-24 MED ORDER — LEVOTHYROXINE SODIUM 75 MCG PO TABS: 75.0000 ug | ORAL_TABLET | Freq: Every day | ORAL | Status: DC

## 2011-08-24 NOTE — Telephone Encounter (Signed)
Short term rx sent to [facility] - pt aware

## 2011-08-24 NOTE — Telephone Encounter (Signed)
Refill Synthroid to [facility]. [facility] told her the her next refill in on 09-02-2011, but she only has 7 pills left. Please call [facility]. Please advise of meds. Thanks.

## 2011-09-12 ENCOUNTER — Encounter: Payer: Self-pay | Admitting: Internal Medicine

## 2011-09-12 ENCOUNTER — Ambulatory Visit (INDEPENDENT_AMBULATORY_CARE_PROVIDER_SITE_OTHER): Payer: Medicare Other | Admitting: Internal Medicine

## 2011-09-12 DIAGNOSIS — E039 Hypothyroidism, unspecified: Secondary | ICD-10-CM

## 2011-09-12 DIAGNOSIS — E785 Hyperlipidemia, unspecified: Secondary | ICD-10-CM

## 2011-09-12 DIAGNOSIS — I10 Essential (primary) hypertension: Secondary | ICD-10-CM

## 2011-09-12 NOTE — Patient Instructions (Signed)
Smoking tobacco is very bad for your health. You should stop smoking immediately.  Please check your blood pressure on a regular basis.  If it is consistently greater than 150/90, please make an office appointment.  Limit your sodium (Salt) intake

## 2011-09-12 NOTE — Progress Notes (Signed)
  Subjective:    Patient ID: Dawn Woods, female    DOB: Dec 29, 1929, 77 y.o.   MRN: [id number]  HPI  77 year old patient who is seen today for her six-month followup. Her Synthroid dose was adjusted in July. She was said down titrated slightly. She has dyslipidemia hypertension ongoing tobacco use. She feels well today. Her weight is unchanged    Review of Systems  Constitutional: Negative.   HENT: Negative for hearing loss, congestion, sore throat, rhinorrhea, dental problem, sinus pressure and tinnitus.   Eyes: Negative for pain, discharge and visual disturbance.  Respiratory: Negative for cough and shortness of breath.   Cardiovascular: Negative for chest pain, palpitations and leg swelling.  Gastrointestinal: Negative for nausea, vomiting, abdominal pain, diarrhea, constipation, blood in stool and abdominal distention.  Genitourinary: Negative for dysuria, urgency, frequency, hematuria, flank pain, vaginal bleeding, vaginal discharge, difficulty urinating, vaginal pain and pelvic pain.  Musculoskeletal: Negative for joint swelling, arthralgias and gait problem.  Skin: Negative for rash.  Neurological: Negative for dizziness, syncope, speech difficulty, weakness, numbness and headaches.  Hematological: Negative for adenopathy.  Psychiatric/Behavioral: Negative for behavioral problems, dysphoric mood and agitation. The patient is not nervous/anxious.        Objective:   Physical Exam  Constitutional: She is oriented to person, place, and time. She appears well-developed and well-nourished.  HENT:  Head: Normocephalic.  Right Ear: External ear normal.  Left Ear: External ear normal.  Mouth/Throat: Oropharynx is clear and moist.  Eyes: Conjunctivae and EOM are normal. Pupils are equal, round, and reactive to light.  Neck: Normal range of motion. Neck supple. No thyromegaly present.  Cardiovascular: Normal rate, regular rhythm, normal heart sounds and intact distal pulses.     Pulmonary/Chest: Effort normal and breath sounds normal.  Abdominal: Soft. Bowel sounds are normal. She exhibits no mass. There is no tenderness.  Musculoskeletal: Normal range of motion.  Lymphadenopathy:    She has no cervical adenopathy.  Neurological: She is alert and oriented to person, place, and time.  Skin: Skin is warm and dry. No rash noted.  Psychiatric: She has a normal mood and affect. Her behavior is normal.          Assessment & Plan:   Hypothyroidism. Hypertension stable Gastroesophageal reflux disease Dyslipidemia  We'll recheck in July for her annual exam

## 2011-12-12 ENCOUNTER — Ambulatory Visit (INDEPENDENT_AMBULATORY_CARE_PROVIDER_SITE_OTHER): Payer: Medicare Other | Admitting: Internal Medicine

## 2011-12-12 ENCOUNTER — Encounter: Payer: Self-pay | Admitting: Internal Medicine

## 2011-12-12 VITALS — BP 140/70 | Temp 97.4°F | Wt 130.0 lb

## 2011-12-12 DIAGNOSIS — I10 Essential (primary) hypertension: Secondary | ICD-10-CM | POA: Diagnosis not present

## 2011-12-12 DIAGNOSIS — R300 Dysuria: Secondary | ICD-10-CM

## 2011-12-12 DIAGNOSIS — N390 Urinary tract infection, site not specified: Secondary | ICD-10-CM | POA: Diagnosis not present

## 2011-12-12 DIAGNOSIS — C44211 Basal cell carcinoma of skin of unspecified ear and external auricular canal: Secondary | ICD-10-CM | POA: Diagnosis not present

## 2011-12-12 LAB — POCT URINALYSIS DIPSTICK
Ketones, UA: NEGATIVE
Protein, UA: 100
Spec Grav, UA: 1.025
Urobilinogen, UA: 0.2
pH, UA: 6.5

## 2011-12-12 MED ORDER — TRIMETHOPRIM 100 MG PO TABS: 100.0000 mg | ORAL_TABLET | Freq: Two times a day (BID) | ORAL | Status: AC

## 2011-12-12 NOTE — Progress Notes (Signed)
  Subjective:    Patient ID: Dawn Woods, female    DOB: 02/07/1930, 78 y.o.   MRN: [id number]  HPI  78 year old patient who presents with a chief complaint of burning dysuria and frequency for 2 days duration. She has a intolerance to ciprofloxacin which has caused pruritus in the past She has a history of hypertension and ongoing tobacco use.    Review of Systems  Constitutional: Negative.   HENT: Negative for hearing loss, congestion, sore throat, rhinorrhea, dental problem, sinus pressure and tinnitus.   Eyes: Negative for pain, discharge and visual disturbance.  Respiratory: Negative for cough and shortness of breath.   Cardiovascular: Negative for chest pain, palpitations and leg swelling.  Gastrointestinal: Negative for nausea, vomiting, abdominal pain, diarrhea, constipation, blood in stool and abdominal distention.  Genitourinary: Positive for dysuria and frequency. Negative for urgency, hematuria, flank pain, vaginal bleeding, vaginal discharge, difficulty urinating, vaginal pain and pelvic pain.  Musculoskeletal: Negative for joint swelling, arthralgias and gait problem.  Skin: Negative for rash.  Neurological: Negative for dizziness, syncope, speech difficulty, weakness, numbness and headaches.  Hematological: Negative for adenopathy.  Psychiatric/Behavioral: Negative for behavioral problems, dysphoric mood and agitation. The patient is not nervous/anxious.        Objective:   Physical Exam  Constitutional:       Blood pressure 130/70  Pulmonary/Chest:       Few coarse rhonchi  Skin:       A 3- 4 mm helix left ear worrisome for basal cell cancer          Assessment & Plan:   UTI. Will treat with  Trimpex Hypertension stable Rule out BCE  left helix

## 2011-12-12 NOTE — Patient Instructions (Signed)
Take your antibiotic as prescribed until ALL of it is gone, but stop if you develop a rash, swelling, or any side effects of the medication.  Contact our office as soon as possible if  there are side effects of the medication.  Dermatology followup as discussed  Annual exam as scheduled

## 2011-12-22 DIAGNOSIS — B351 Tinea unguium: Secondary | ICD-10-CM | POA: Diagnosis not present

## 2011-12-22 DIAGNOSIS — M79609 Pain in unspecified limb: Secondary | ICD-10-CM | POA: Diagnosis not present

## 2011-12-22 DIAGNOSIS — I70209 Unspecified atherosclerosis of native arteries of extremities, unspecified extremity: Secondary | ICD-10-CM | POA: Diagnosis not present

## 2011-12-22 DIAGNOSIS — L988 Other specified disorders of the skin and subcutaneous tissue: Secondary | ICD-10-CM | POA: Diagnosis not present

## 2011-12-22 DIAGNOSIS — L851 Acquired keratosis [keratoderma] palmaris et plantaris: Secondary | ICD-10-CM | POA: Diagnosis not present

## 2011-12-26 ENCOUNTER — Telehealth: Payer: Self-pay | Admitting: Internal Medicine

## 2011-12-26 NOTE — Telephone Encounter (Signed)
Please schedule office visit this week and suggest daughter accompany patient on the visit

## 2011-12-26 NOTE — Telephone Encounter (Signed)
Please advise 

## 2011-12-26 NOTE — Telephone Encounter (Signed)
Spoke with debra - mom out of town - will be back next week - appt made for wed. 4/10

## 2011-12-26 NOTE — Telephone Encounter (Signed)
Pts daughter called and said that pt was just seen for uti. Pt now has swollen feet and circulatory problems. Req to get a referral to Vascular Surgeon.   Also need to know when pt had last HGA1C done.

## 2012-01-04 ENCOUNTER — Ambulatory Visit (INDEPENDENT_AMBULATORY_CARE_PROVIDER_SITE_OTHER): Payer: Medicare Other | Admitting: Internal Medicine

## 2012-01-04 ENCOUNTER — Encounter: Payer: Self-pay | Admitting: Internal Medicine

## 2012-01-04 VITALS — BP 126/84 | Temp 97.7°F | Wt 130.0 lb

## 2012-01-04 DIAGNOSIS — E785 Hyperlipidemia, unspecified: Secondary | ICD-10-CM

## 2012-01-04 DIAGNOSIS — Z720 Tobacco use: Secondary | ICD-10-CM | POA: Insufficient documentation

## 2012-01-04 DIAGNOSIS — I771 Stricture of artery: Secondary | ICD-10-CM

## 2012-01-04 DIAGNOSIS — I10 Essential (primary) hypertension: Secondary | ICD-10-CM

## 2012-01-04 DIAGNOSIS — H61009 Unspecified perichondritis of external ear, unspecified ear: Secondary | ICD-10-CM | POA: Diagnosis not present

## 2012-01-04 DIAGNOSIS — E039 Hypothyroidism, unspecified: Secondary | ICD-10-CM

## 2012-01-04 DIAGNOSIS — F17200 Nicotine dependence, unspecified, uncomplicated: Secondary | ICD-10-CM | POA: Diagnosis not present

## 2012-01-04 DIAGNOSIS — D485 Neoplasm of uncertain behavior of skin: Secondary | ICD-10-CM | POA: Diagnosis not present

## 2012-01-04 DIAGNOSIS — L919 Hypertrophic disorder of the skin, unspecified: Secondary | ICD-10-CM | POA: Diagnosis not present

## 2012-01-04 HISTORY — DX: Tobacco use: Z72.0

## 2012-01-04 NOTE — Progress Notes (Signed)
  Subjective:    Patient ID: Dawn Woods, female    DOB: Feb 17, 1930, 78 y.o.   MRN: [id number]  HPI  Wt Readings from Last 3 Encounters:  01/04/12 130 lb (58.968 kg)  12/12/11 130 lb (58.968 kg)  09/12/11 127 lb (57.607 kg)    Review of Systems     Objective:   Physical Exam        Assessment & Plan:

## 2012-01-04 NOTE — Patient Instructions (Signed)
Avoid trauma to the feet  Smoking tobacco is very bad for your health. You should stop smoking immediately.  Arterial Doppler evaluation as discussed  Moisturizing cream to both feet twice daily  Call if any clinical worsening  Return in 2 weeks for followup

## 2012-01-04 NOTE — Progress Notes (Signed)
Subjective:    Patient ID: Dawn Woods, female    DOB: March 16, 1930, 78 y.o.   MRN: [id number]  HPI  78 year old patient who is seen today because of family's concern about poor circulation to the feet.  Cardiovascular risk factors include hypertension dyslipidemia and ongoing tobacco use. She smokes approximately 5 cigarettes daily. The patient is unclear how long she has had difficulty with her feet. She is accompanied by a daughter who states that she had no problems with her feet last summer.  Family has noted cyanotic toes with a dry flaky rash involving all digits    Review of Systems  Constitutional: Negative.   HENT: Negative for hearing loss, congestion, sore throat, rhinorrhea, dental problem, sinus pressure and tinnitus.   Eyes: Negative for pain, discharge and visual disturbance.  Respiratory: Negative for cough and shortness of breath.   Cardiovascular: Negative for chest pain, palpitations and leg swelling.  Gastrointestinal: Negative for nausea, vomiting, abdominal pain, diarrhea, constipation, blood in stool and abdominal distention.  Genitourinary: Negative for dysuria, urgency, frequency, hematuria, flank pain, vaginal bleeding, vaginal discharge, difficulty urinating, vaginal pain and pelvic pain.  Musculoskeletal: Negative for joint swelling, arthralgias and gait problem (denies claudication).  Skin: Positive for rash.  Neurological: Negative for dizziness, syncope, speech difficulty, weakness, numbness and headaches.  Hematological: Negative for adenopathy.  Psychiatric/Behavioral: Negative for behavioral problems, dysphoric mood and agitation. The patient is not nervous/anxious.        Objective:   Physical Exam  Constitutional: She is oriented to person, place, and time. She appears well-developed and well-nourished.       Blood pressure normal  HENT:  Head: Normocephalic.  Right Ear: External ear normal.  Left Ear: External ear normal.  Mouth/Throat:  Oropharynx is clear and moist.  Eyes: Conjunctivae and EOM are normal. Pupils are equal, round, and reactive to light.  Neck: Normal range of motion. Neck supple. No thyromegaly present.  Cardiovascular: Normal rate, regular rhythm and normal heart sounds.        Femoral pulses appear to be normal without bruits Right pedal pulses were intact Left pedal pulses were diminished and  the left dorsalis pedis pulse not definitely palpable  Pulmonary/Chest: Effort normal and breath sounds normal.       O2 saturation 97%  Abdominal: Soft. Bowel sounds are normal. She exhibits no mass. There is no tenderness.  Musculoskeletal: Normal range of motion.  Lymphadenopathy:    She has no cervical adenopathy.  Neurological: She is alert and oriented to person, place, and time.  Skin: Skin is warm and dry. No rash noted.       All 10 toes were slightly cyanotic but did blanch with pressure; toes are quite dry and flaky. Multiple areas appeared to be consistent with tiny ischemic ulcerations  Psychiatric: She has a normal mood and affect. Her behavior is normal.          Assessment & Plan:   Patient presents with a dermatitis of both feet of unclear duration; she denies any claudication. Her only complaint is some pain involving the right heel related to some recent trauma. Pedal pulses were clearly palpable on the right and the foot dermatitis was perhaps slightly worse on the right. Still concerned about arterial insufficiency involving more distal digital arteries. Local wound care discussed;  she'll avoid trauma and use a moisturizing cream. She'll be placed on aspirin 81 mg daily and continue aggressive risk factor modification with lipid and blood pressure control. Total cessation of  smoking encouraged;  we'll set up for arterial Doppler evaluation Hypertension stable Dyslipidemia Ongoing tobacco use

## 2012-01-05 ENCOUNTER — Encounter (INDEPENDENT_AMBULATORY_CARE_PROVIDER_SITE_OTHER): Payer: Medicare Other

## 2012-01-05 DIAGNOSIS — L97909 Non-pressure chronic ulcer of unspecified part of unspecified lower leg with unspecified severity: Secondary | ICD-10-CM | POA: Diagnosis not present

## 2012-01-05 DIAGNOSIS — I771 Stricture of artery: Secondary | ICD-10-CM

## 2012-01-12 NOTE — Progress Notes (Signed)
Quick Note:  Spoke with pt - informed normal ______ 

## 2012-01-18 ENCOUNTER — Encounter: Payer: Self-pay | Admitting: Internal Medicine

## 2012-01-18 ENCOUNTER — Ambulatory Visit (INDEPENDENT_AMBULATORY_CARE_PROVIDER_SITE_OTHER): Payer: Medicare Other | Admitting: Internal Medicine

## 2012-01-18 VITALS — BP 140/80 | Wt 132.0 lb

## 2012-01-18 DIAGNOSIS — F17200 Nicotine dependence, unspecified, uncomplicated: Secondary | ICD-10-CM | POA: Diagnosis not present

## 2012-01-18 DIAGNOSIS — I10 Essential (primary) hypertension: Secondary | ICD-10-CM

## 2012-01-18 DIAGNOSIS — Z720 Tobacco use: Secondary | ICD-10-CM

## 2012-01-18 DIAGNOSIS — E785 Hyperlipidemia, unspecified: Secondary | ICD-10-CM

## 2012-01-18 NOTE — Progress Notes (Signed)
  Subjective:    Patient ID: Dawn Woods, female    DOB: [birth year], 78 y.o.   MRN: [id number]  HPI  78 year old patient seen today in followup. She presented 2 weeks ago with a history of cyanosis involving the toes. She states that her toes are normal in the morning but later in the day become blue and cyanotic. There's been no pain clinical exam was unremarkable but the patient did have lower extremities arterial Dopplers performed with normal ABIs. Only complaint today is right heel pain that seems to be improving. She has dyslipidemia hypertension and ongoing tobacco use   Review of Systems  Constitutional: Negative.   HENT: Negative for hearing loss, congestion, sore throat, rhinorrhea, dental problem, sinus pressure and tinnitus.   Eyes: Negative for pain, discharge and visual disturbance.  Respiratory: Negative for cough and shortness of breath.   Cardiovascular: Negative for chest pain, palpitations and leg swelling.  Gastrointestinal: Negative for nausea, vomiting, abdominal pain, diarrhea, constipation, blood in stool and abdominal distention.  Genitourinary: Negative for dysuria, urgency, frequency, hematuria, flank pain, vaginal bleeding, vaginal discharge, difficulty urinating, vaginal pain and pelvic pain.  Musculoskeletal: Negative for joint swelling, arthralgias (right heel pain) and gait problem.  Skin: Negative for rash.  Neurological: Negative for dizziness, syncope, speech difficulty, weakness, numbness and headaches.  Hematological: Negative for adenopathy.  Psychiatric/Behavioral: Negative for behavioral problems, dysphoric mood and agitation. The patient is not nervous/anxious.        Objective:   Physical Exam  Constitutional: She is oriented to person, place, and time. She appears well-developed and well-nourished. No distress.  HENT:  Head: Normocephalic.  Right Ear: External ear normal.  Left Ear: External ear normal.  Eyes: Conjunctivae and EOM are  normal. Pupils are equal, round, and reactive to light.  Neck: Normal range of motion. Neck supple. No thyromegaly present.  Cardiovascular: Normal rate, regular rhythm, normal heart sounds and intact distal pulses.        The left dorsalis pedis pulse diminished Toes were cyanotic but blanched  Pulmonary/Chest: Effort normal and breath sounds normal.       O2 saturation 96  Abdominal: She exhibits no mass. There is no tenderness.  Lymphadenopathy:    She has no cervical adenopathy.  Neurological: She is alert and oriented to person, place, and time.  Skin: Skin is warm and dry. No rash noted.       Cemented right heel revealed some dry thickened skin with some fissuring. No signs of active inflammation  Psychiatric: She has a normal mood and affect. Her behavior is normal.          Assessment & Plan:   Hypertension stable Dyslipidemia Ongoing tobacco use  No evidence of significant large vessel peripheral vascular disease. Total cessation of smoking encouraged Recheck 6 months

## 2012-01-18 NOTE — Patient Instructions (Signed)
Smoking tobacco is very bad for your health. You should stop smoking immediately.  Limit your sodium (Salt) intake  Return in 6 months for follow-up   

## 2012-05-07 ENCOUNTER — Other Ambulatory Visit: Payer: Self-pay | Admitting: Internal Medicine

## 2012-05-22 ENCOUNTER — Other Ambulatory Visit: Payer: Self-pay | Admitting: Internal Medicine

## 2012-07-24 ENCOUNTER — Ambulatory Visit (INDEPENDENT_AMBULATORY_CARE_PROVIDER_SITE_OTHER): Payer: Medicare Other | Admitting: Internal Medicine

## 2012-07-24 ENCOUNTER — Encounter: Payer: Self-pay | Admitting: Internal Medicine

## 2012-07-24 VITALS — BP 146/90 | Temp 98.0°F | Wt 127.0 lb

## 2012-07-24 DIAGNOSIS — E785 Hyperlipidemia, unspecified: Secondary | ICD-10-CM | POA: Diagnosis not present

## 2012-07-24 DIAGNOSIS — F17200 Nicotine dependence, unspecified, uncomplicated: Secondary | ICD-10-CM

## 2012-07-24 DIAGNOSIS — Z720 Tobacco use: Secondary | ICD-10-CM

## 2012-07-24 DIAGNOSIS — K219 Gastro-esophageal reflux disease without esophagitis: Secondary | ICD-10-CM | POA: Diagnosis not present

## 2012-07-24 DIAGNOSIS — E039 Hypothyroidism, unspecified: Secondary | ICD-10-CM | POA: Diagnosis not present

## 2012-07-24 DIAGNOSIS — I10 Essential (primary) hypertension: Secondary | ICD-10-CM | POA: Diagnosis not present

## 2012-07-24 MED ORDER — SIMVASTATIN 10 MG PO TABS: 10.0000 mg | ORAL_TABLET | Freq: Every day | ORAL | Status: DC

## 2012-07-24 MED ORDER — LEVOTHYROXINE SODIUM 75 MCG PO TABS: 75.0000 ug | ORAL_TABLET | Freq: Every day | ORAL | Status: DC

## 2012-07-24 NOTE — Progress Notes (Signed)
Subjective:    Patient ID: Dawn Woods, female    DOB: August 24, 1930, 78 y.o.   MRN: [id number]  HPI  78 year old patient who is seen today for followup. She has a history of treated hypothyroidism as well as dyslipidemia that has been treated with simvastatin. She is doing quite well today and denies any cardiopulmonary complaints. She has treated hypertension and a history of ongoing tobacco use. She is using omeprazole for gastroesophageal reflux disease  Past Medical History  Diagnosis Date  . GERD (gastroesophageal reflux disease)   . Hyperlipidemia   . Hypertension   . Hypothyroidism   . H/O: hematuria 07/2010    History   Social History  . Marital Status: Widowed    Spouse Name: N/A    Number of Children: 3  . Years of Education: N/A   Occupational History  . Not on file.   Social History Main Topics  . Smoking status: Current Every Day Smoker -- 0.3 packs/day    Types: Cigarettes  . Smokeless tobacco: Never Used  . Alcohol Use: No  . Drug Use: No  . Sexually Active: Not on file   Other Topics Concern  . Not on file   Social History Narrative  . No narrative on file    Past Surgical History  Procedure Date  . Appendectomy     age 78  . Oophorectomy 1954  . Tonsillectomy     age 78  . Cholecystectomy     age 78  . Dilation and curettage of uterus     multiple's  . Fracture surgery Dec 2009    left wrist    Family History  Problem Relation Age of Onset  . Thyroid disease Sister 11    Allergies  Allergen Reactions  . Ciprofloxacin     REACTION: hives    Current Outpatient Prescriptions on File Prior to Visit  Medication Sig Dispense Refill  . DIOVAN 160 MG tablet TAKE 1 TABLET DAILY  90 tablet  3  . omeprazole (PRILOSEC) 20 MG capsule TAKE 1 CAPSULE DAILY  90 capsule  3  . DISCONTD: levothyroxine (SYNTHROID) 75 MCG tablet Take 1 tablet (75 mcg total) by mouth daily.  30 tablet  0  . DISCONTD: levothyroxine (SYNTHROID, LEVOTHROID) 75 MCG  tablet TAKE 1 TABLET DAILY  90 tablet  2  . DISCONTD: simvastatin (ZOCOR) 10 MG tablet TAKE 1 TABLET DAILY  90 tablet  3    BP 146/90  Temp 98 F (36.7 C) (Oral)  Wt 127 lb (57.607 kg)       Review of Systems  Constitutional: Negative.   HENT: Negative for hearing loss, congestion, sore throat, rhinorrhea, dental problem, sinus pressure and tinnitus.   Eyes: Negative for pain, discharge and visual disturbance.  Respiratory: Negative for cough and shortness of breath.   Cardiovascular: Negative for chest pain, palpitations and leg swelling.  Gastrointestinal: Negative for nausea, vomiting, abdominal pain, diarrhea, constipation, blood in stool and abdominal distention.  Genitourinary: Negative for dysuria, urgency, frequency, hematuria, flank pain, vaginal bleeding, vaginal discharge, difficulty urinating, vaginal pain and pelvic pain.  Musculoskeletal: Negative for joint swelling, arthralgias and gait problem.  Skin: Negative for rash.  Neurological: Negative for dizziness, syncope, speech difficulty, weakness, numbness and headaches.  Hematological: Negative for adenopathy.  Psychiatric/Behavioral: Negative for behavioral problems, dysphoric mood and agitation. The patient is not nervous/anxious.        Objective:   Physical Exam  Constitutional: She is oriented to person, place, and time.  She appears well-developed and well-nourished.  HENT:  Head: Normocephalic.  Right Ear: External ear normal.  Left Ear: External ear normal.  Mouth/Throat: Oropharynx is clear and moist.  Eyes: Conjunctivae normal and EOM are normal. Pupils are equal, round, and reactive to light.  Neck: Normal range of motion. Neck supple. No thyromegaly present.  Cardiovascular: Normal rate, regular rhythm, normal heart sounds and intact distal pulses.   Pulmonary/Chest: Effort normal and breath sounds normal.  Abdominal: Soft. Bowel sounds are normal. She exhibits no mass. There is no tenderness.    Musculoskeletal: Normal range of motion.  Lymphadenopathy:    She has no cervical adenopathy.  Neurological: She is alert and oriented to person, place, and time.  Skin: Skin is warm and dry. No rash noted.  Psychiatric: She has a normal mood and affect. Her behavior is normal.          Assessment & Plan:   Hypertension well controlled. Repeat blood pressure 130/78 Ongoing tobacco use. Smoking cessation encouraged Hypothyroidism medicine refilled Dyslipidemia. Continue simvastatin 10  Medications refilled CPX in 6 months

## 2012-07-24 NOTE — Patient Instructions (Addendum)
Limit your sodium (Salt) intake    It is important that you exercise regularly, at least 20 minutes 3 to 4 times per week.  If you develop chest pain or shortness of breath seek  medical attention.  Avoids foods high in acid such as tomatoes citrus juices, and spicy foods.  Avoid eating within two hours of lying down or before exercising.  Do not overheat.  Try smaller more frequent meals.  If symptoms persist, elevate the head of her bed 12 inches while sleeping.  Return in 6 months for follow-up  

## 2012-07-27 ENCOUNTER — Encounter: Payer: Self-pay | Admitting: Internal Medicine

## 2012-07-27 ENCOUNTER — Telehealth: Payer: Self-pay

## 2012-07-27 ENCOUNTER — Ambulatory Visit (INDEPENDENT_AMBULATORY_CARE_PROVIDER_SITE_OTHER): Payer: Medicare Other | Admitting: Internal Medicine

## 2012-07-27 VITALS — BP 110/80 | Temp 97.9°F

## 2012-07-27 DIAGNOSIS — N390 Urinary tract infection, site not specified: Secondary | ICD-10-CM

## 2012-07-27 DIAGNOSIS — R300 Dysuria: Secondary | ICD-10-CM | POA: Diagnosis not present

## 2012-07-27 DIAGNOSIS — I10 Essential (primary) hypertension: Secondary | ICD-10-CM | POA: Diagnosis not present

## 2012-07-27 LAB — POCT URINALYSIS DIPSTICK
Bilirubin, UA: NEGATIVE
Ketones, UA: NEGATIVE
Spec Grav, UA: 1.025
pH, UA: 6.5

## 2012-07-27 MED ORDER — AMOXICILLIN 500 MG PO CAPS: 500.0000 mg | ORAL_CAPSULE | Freq: Three times a day (TID) | ORAL | Status: DC

## 2012-07-27 NOTE — Telephone Encounter (Signed)
Called in to [facility] - was sent to [facility] by mistake

## 2012-07-27 NOTE — Progress Notes (Signed)
Subjective:    Patient ID: Dawn Woods, female    DOB: 28-Dec-1929, 78 y.o.   MRN: [id number]  HPI45 year old patient who has treated hypertension. She also has a history of GERD and dyslipidemia. Complaints today include urinary frequency and dysuria. She has had prior urinary tract infections. She also sustained a bee sting to her left index finger 2 days ago and this has been a bit bothersome.  Past Medical History  Diagnosis Date  . GERD (gastroesophageal reflux disease)   . Hyperlipidemia   . Hypertension   . Hypothyroidism   . H/O: hematuria 07/2010    History   Social History  . Marital Status: Widowed    Spouse Name: N/A    Number of Children: 3  . Years of Education: N/A   Occupational History  . Not on file.   Social History Main Topics  . Smoking status: Current Every Day Smoker -- 0.3 packs/day    Types: Cigarettes  . Smokeless tobacco: Never Used  . Alcohol Use: No  . Drug Use: No  . Sexually Active: Not on file   Other Topics Concern  . Not on file   Social History Narrative  . No narrative on file    Past Surgical History  Procedure Date  . Appendectomy     age 53  . Oophorectomy 1954  . Tonsillectomy     age 59  . Cholecystectomy     age 13  . Dilation and curettage of uterus     multiple's  . Fracture surgery Dec 2009    left wrist    Family History  Problem Relation Age of Onset  . Thyroid disease Sister 17    Allergies  Allergen Reactions  . Ciprofloxacin     REACTION: hives    Current Outpatient Prescriptions on File Prior to Visit  Medication Sig Dispense Refill  . DIOVAN 160 MG tablet TAKE 1 TABLET DAILY  90 tablet  3  . levothyroxine (SYNTHROID, LEVOTHROID) 75 MCG tablet Take 1 tablet (75 mcg total) by mouth daily.  90 tablet  2  . omeprazole (PRILOSEC) 20 MG capsule TAKE 1 CAPSULE DAILY  90 capsule  3  . simvastatin (ZOCOR) 10 MG tablet Take 1 tablet (10 mg total) by mouth at bedtime.  90 tablet  3    BP 110/80   Temp 97.9 F (36.6 C) (Oral)       Review of Systems  Constitutional: Negative.   HENT: Negative for hearing loss, congestion, sore throat, rhinorrhea, dental problem, sinus pressure and tinnitus.   Eyes: Negative for pain, discharge and visual disturbance.  Respiratory: Negative for cough and shortness of breath.   Cardiovascular: Negative for chest pain, palpitations and leg swelling.  Gastrointestinal: Negative for nausea, vomiting, abdominal pain, diarrhea, constipation, blood in stool and abdominal distention.  Genitourinary: Positive for dysuria and urgency. Negative for frequency, hematuria, flank pain, vaginal bleeding, vaginal discharge, difficulty urinating, vaginal pain and pelvic pain.  Musculoskeletal: Negative for joint swelling, arthralgias and gait problem.  Skin: Negative for rash.  Neurological: Negative for dizziness, syncope, speech difficulty, weakness, numbness and headaches.  Hematological: Negative for adenopathy.  Psychiatric/Behavioral: Negative for behavioral problems, dysphoric mood and agitation. The patient is not nervous/anxious.        Objective:   Physical Exam  Constitutional: She is oriented to person, place, and time. She appears well-developed and well-nourished.  HENT:  Head: Normocephalic.  Right Ear: External ear normal.  Left Ear: External ear normal.  Mouth/Throat: Oropharynx is clear and moist.  Eyes: Conjunctivae normal and EOM are normal. Pupils are equal, round, and reactive to light.  Neck: Normal range of motion. Neck supple. No thyromegaly present.  Cardiovascular: Normal rate, regular rhythm, normal heart sounds and intact distal pulses.   Pulmonary/Chest: Effort normal and breath sounds normal.  Abdominal: Soft. Bowel sounds are normal. She exhibits no mass. There is no tenderness.  Musculoskeletal: Normal range of motion.  Lymphadenopathy:    She has no cervical adenopathy.  Neurological: She is alert and oriented to person,  place, and time.  Skin: Skin is warm and dry. No rash noted.       Soft tissue swelling involving the distal left index finger with mild erythema  Psychiatric: She has a normal mood and affect. Her behavior is normal.          Assessment & Plan:   Acute UTI. Will treat with Amoxil Hypertension stable  Recheck 6 months  and will use Benadryl as needed for theBee sting involving the left index finger. We'll apply ice and attempt to keep elevated

## 2012-07-27 NOTE — Patient Instructions (Signed)
Take your antibiotic as prescribed until ALL of it is gone, but stop if you develop a rash, swelling, or any side effects of the medication.  Contact our office as soon as possible if  there are side effects of the medication.  Benadryl every 6 hours as needed for itching or swelling

## 2013-01-17 ENCOUNTER — Ambulatory Visit (INDEPENDENT_AMBULATORY_CARE_PROVIDER_SITE_OTHER): Payer: Medicare Other | Admitting: Internal Medicine

## 2013-01-17 ENCOUNTER — Encounter: Payer: Self-pay | Admitting: Internal Medicine

## 2013-01-17 VITALS — BP 140/80 | HR 115 | Temp 97.4°F | Resp 20 | Wt 132.0 lb

## 2013-01-17 DIAGNOSIS — N390 Urinary tract infection, site not specified: Secondary | ICD-10-CM

## 2013-01-17 DIAGNOSIS — I10 Essential (primary) hypertension: Secondary | ICD-10-CM | POA: Diagnosis not present

## 2013-01-17 DIAGNOSIS — F17200 Nicotine dependence, unspecified, uncomplicated: Secondary | ICD-10-CM

## 2013-01-17 DIAGNOSIS — E039 Hypothyroidism, unspecified: Secondary | ICD-10-CM

## 2013-01-17 DIAGNOSIS — R300 Dysuria: Secondary | ICD-10-CM | POA: Diagnosis not present

## 2013-01-17 DIAGNOSIS — Z720 Tobacco use: Secondary | ICD-10-CM

## 2013-01-17 LAB — POCT URINALYSIS DIPSTICK
Bilirubin, UA: NEGATIVE
Glucose, UA: NEGATIVE
Ketones, UA: NEGATIVE
Nitrite, UA: NEGATIVE
Spec Grav, UA: 1.02

## 2013-01-17 MED ORDER — SIMVASTATIN 10 MG PO TABS: 10.0000 mg | ORAL_TABLET | Freq: Every day | ORAL | Status: DC

## 2013-01-17 MED ORDER — LEVOTHYROXINE SODIUM 75 MCG PO TABS: 75.0000 ug | ORAL_TABLET | Freq: Every day | ORAL | Status: DC

## 2013-01-17 MED ORDER — TRIMETHOPRIM 100 MG PO TABS: 100.0000 mg | ORAL_TABLET | Freq: Two times a day (BID) | ORAL | Status: DC

## 2013-01-17 MED ORDER — VALSARTAN 160 MG PO TABS: ORAL_TABLET | ORAL | Status: DC

## 2013-01-17 NOTE — Progress Notes (Signed)
Subjective:    Patient ID: Dawn Woods, female    DOB: 1930-09-05, 79 y.o.   MRN: [id number]  HPI  79 year old patient who presents with a one-day history of burning dysuria and frequency. She has had UTIs in the past. No fever chills or flank pain. She has treated hypertension and these medications refilled. Blood pressure well controlled today She has a history of dyslipidemia and ongoing tobacco use. Past Medical History  Diagnosis Date  . GERD (gastroesophageal reflux disease)   . Hyperlipidemia   . Hypertension   . Hypothyroidism   . H/O: hematuria 07/2010    History   Social History  . Marital Status: Widowed    Spouse Name: N/A    Number of Children: 3  . Years of Education: N/A   Occupational History  . Not on file.   Social History Main Topics  . Smoking status: Current Every Day Smoker -- 0.30 packs/day    Types: Cigarettes  . Smokeless tobacco: Never Used  . Alcohol Use: No  . Drug Use: No  . Sexually Active: Not on file   Other Topics Concern  . Not on file   Social History Narrative  . No narrative on file    Past Surgical History  Procedure Laterality Date  . Appendectomy      age 67  . Oophorectomy  1954  . Tonsillectomy      age 15  . Cholecystectomy      age 28  . Dilation and curettage of uterus      multiple's  . Fracture surgery  Dec 2009    left wrist    Family History  Problem Relation Age of Onset  . Thyroid disease Sister 60    Allergies  Allergen Reactions  . Ciprofloxacin     REACTION: hives    Current Outpatient Prescriptions on File Prior to Visit  Medication Sig Dispense Refill  . DIOVAN 160 MG tablet TAKE 1 TABLET DAILY  90 tablet  3  . levothyroxine (SYNTHROID, LEVOTHROID) 75 MCG tablet Take 1 tablet (75 mcg total) by mouth daily.  90 tablet  2  . simvastatin (ZOCOR) 10 MG tablet Take 1 tablet (10 mg total) by mouth at bedtime.  90 tablet  3   No current facility-administered medications on file prior to  visit.    BP 140/80  Pulse 115  Temp(Src) 97.4 F (36.3 C) (Oral)  Resp 20  Wt 132 lb (59.875 kg)  BMI 21.97 kg/m2  SpO2 97%       Review of Systems  Constitutional: Negative.   HENT: Negative for hearing loss, congestion, sore throat, rhinorrhea, dental problem, sinus pressure and tinnitus.   Eyes: Negative for pain, discharge and visual disturbance.  Respiratory: Negative for cough and shortness of breath.   Cardiovascular: Negative for chest pain, palpitations and leg swelling.  Gastrointestinal: Negative for nausea, vomiting, abdominal pain, diarrhea, constipation, blood in stool and abdominal distention.  Genitourinary: Positive for dysuria and frequency. Negative for urgency, hematuria, flank pain, vaginal bleeding, vaginal discharge, difficulty urinating, vaginal pain and pelvic pain.  Musculoskeletal: Negative for joint swelling, arthralgias and gait problem.  Skin: Negative for rash.  Neurological: Negative for dizziness, syncope, speech difficulty, weakness, numbness and headaches.  Hematological: Negative for adenopathy.  Psychiatric/Behavioral: Negative for behavioral problems, dysphoric mood and agitation. The patient is not nervous/anxious.        Objective:   Physical Exam  Constitutional: She appears well-developed and well-nourished. No distress.  Repeat blood  pressure 130/70. Pulse rate and normal  Psychiatric: She has a normal mood and affect. Her behavior is normal.          Assessment & Plan:   Acute UTI. Will treat with  trimethoprim Hypertension stable. Medications refilled Hypothyroid.  CPX 6 months

## 2013-01-17 NOTE — Patient Instructions (Signed)
Take your antibiotic as prescribed until ALL of it is gone, but stop if you develop a rash, swelling, or any side effects of the medication.  Contact our office as soon as possible if  there are side effects of the medication.  Return in 6 months for follow-up 

## 2013-01-31 ENCOUNTER — Ambulatory Visit (INDEPENDENT_AMBULATORY_CARE_PROVIDER_SITE_OTHER): Payer: Medicare Other | Admitting: Internal Medicine

## 2013-01-31 ENCOUNTER — Encounter: Payer: Self-pay | Admitting: Internal Medicine

## 2013-01-31 VITALS — BP 140/80 | HR 64 | Temp 97.5°F | Resp 20 | Wt 128.0 lb

## 2013-01-31 DIAGNOSIS — I10 Essential (primary) hypertension: Secondary | ICD-10-CM

## 2013-01-31 DIAGNOSIS — N390 Urinary tract infection, site not specified: Secondary | ICD-10-CM

## 2013-01-31 DIAGNOSIS — R300 Dysuria: Secondary | ICD-10-CM | POA: Diagnosis not present

## 2013-01-31 LAB — POCT URINALYSIS DIPSTICK
Nitrite, UA: NEGATIVE
Urobilinogen, UA: 1
pH, UA: 5.5

## 2013-01-31 MED ORDER — AMPICILLIN 500 MG PO CAPS: 500.0000 mg | ORAL_CAPSULE | Freq: Four times a day (QID) | ORAL | Status: DC

## 2013-01-31 NOTE — Patient Instructions (Signed)
Take your antibiotic as prescribed until ALL of it is gone, but stop if you develop a rash, swelling, or any side effects of the medication.  Contact our office as soon as possible if  there are side effects of the medication. 

## 2013-01-31 NOTE — Progress Notes (Signed)
  Subjective:    Patient ID: Dawn Woods, female    DOB: 1930/03/13, 79 y.o.   MRN: [id number]  HPI  79 year old patient who was seen last month with a UTI. Yesterday she had the onset of recurrent dysuria frequency and urgency. No fever or chills.  She has a Cipro allergy and last month was treated with Trimpex.  Past Medical History  Diagnosis Date  . GERD (gastroesophageal reflux disease)   . Hyperlipidemia   . Hypertension   . Hypothyroidism   . H/O: hematuria 07/2010    History   Social History  . Marital Status: Widowed    Spouse Name: N/A    Number of Children: 3  . Years of Education: N/A   Occupational History  . Not on file.   Social History Main Topics  . Smoking status: Current Every Day Smoker -- 0.30 packs/day    Types: Cigarettes  . Smokeless tobacco: Never Used  . Alcohol Use: No  . Drug Use: No  . Sexually Active: Not on file   Other Topics Concern  . Not on file   Social History Narrative  . No narrative on file    Past Surgical History  Procedure Laterality Date  . Appendectomy      age 77  . Oophorectomy  1954  . Tonsillectomy      age 35  . Cholecystectomy      age 34  . Dilation and curettage of uterus      multiple's  . Fracture surgery  Dec 2009    left wrist    Family History  Problem Relation Age of Onset  . Thyroid disease Sister 42    Allergies  Allergen Reactions  . Ciprofloxacin     REACTION: hives    Current Outpatient Prescriptions on File Prior to Visit  Medication Sig Dispense Refill  . levothyroxine (SYNTHROID, LEVOTHROID) 75 MCG tablet Take 1 tablet (75 mcg total) by mouth daily.  90 tablet  2  . simvastatin (ZOCOR) 10 MG tablet Take 1 tablet (10 mg total) by mouth at bedtime.  90 tablet  3  . valsartan (DIOVAN) 160 MG tablet TAKE 1 TABLET DAILY  90 tablet  3   No current facility-administered medications on file prior to visit.    BP 140/80  Pulse 64  Temp(Src) 97.5 F (36.4 C) (Oral)  Resp 20  Wt  128 lb (58.06 kg)  BMI 21.3 kg/m2  SpO2 97%       Review of Systems  Genitourinary: Positive for dysuria, urgency, frequency and difficulty urinating.       Objective:   Physical Exam  Constitutional: She appears well-developed and well-nourished. No distress.  Abdominal: Soft. Bowel sounds are normal. She exhibits no distension. There is no tenderness. There is no rebound.          Assessment & Plan:   Recurrent UTI. Will check a urine culture. Will place on ampicillin. Hypertension stable

## 2013-04-01 ENCOUNTER — Other Ambulatory Visit: Payer: Self-pay | Admitting: Internal Medicine

## 2013-04-22 DIAGNOSIS — H18419 Arcus senilis, unspecified eye: Secondary | ICD-10-CM | POA: Diagnosis not present

## 2013-04-22 DIAGNOSIS — H2510 Age-related nuclear cataract, unspecified eye: Secondary | ICD-10-CM | POA: Diagnosis not present

## 2013-04-22 DIAGNOSIS — H25019 Cortical age-related cataract, unspecified eye: Secondary | ICD-10-CM | POA: Diagnosis not present

## 2013-04-22 DIAGNOSIS — H02839 Dermatochalasis of unspecified eye, unspecified eyelid: Secondary | ICD-10-CM | POA: Diagnosis not present

## 2013-04-30 DIAGNOSIS — H269 Unspecified cataract: Secondary | ICD-10-CM | POA: Diagnosis not present

## 2013-04-30 DIAGNOSIS — H2510 Age-related nuclear cataract, unspecified eye: Secondary | ICD-10-CM | POA: Diagnosis not present

## 2013-05-08 DIAGNOSIS — H2510 Age-related nuclear cataract, unspecified eye: Secondary | ICD-10-CM | POA: Diagnosis not present

## 2013-05-08 DIAGNOSIS — H269 Unspecified cataract: Secondary | ICD-10-CM | POA: Diagnosis not present

## 2013-07-18 ENCOUNTER — Ambulatory Visit (INDEPENDENT_AMBULATORY_CARE_PROVIDER_SITE_OTHER): Payer: Medicare Other | Admitting: Internal Medicine

## 2013-07-18 ENCOUNTER — Encounter: Payer: Self-pay | Admitting: Internal Medicine

## 2013-07-18 VITALS — BP 120/80

## 2013-07-18 DIAGNOSIS — I10 Essential (primary) hypertension: Secondary | ICD-10-CM

## 2013-07-18 DIAGNOSIS — R351 Nocturia: Secondary | ICD-10-CM

## 2013-07-18 DIAGNOSIS — Z720 Tobacco use: Secondary | ICD-10-CM

## 2013-07-18 DIAGNOSIS — F17200 Nicotine dependence, unspecified, uncomplicated: Secondary | ICD-10-CM | POA: Diagnosis not present

## 2013-07-18 DIAGNOSIS — N390 Urinary tract infection, site not specified: Secondary | ICD-10-CM | POA: Diagnosis not present

## 2013-07-18 LAB — POCT URINALYSIS DIPSTICK
Ketones, UA: NEGATIVE
Protein, UA: 30
Spec Grav, UA: 1.025

## 2013-07-18 MED ORDER — TRIMETHOPRIM 100 MG PO TABS: 100.0000 mg | ORAL_TABLET | Freq: Two times a day (BID) | ORAL | Status: DC

## 2013-07-18 NOTE — Progress Notes (Signed)
Subjective:    Patient ID: Dawn Woods, female    DOB: [birth year], 79 y.o.   MRN: [id number]  HPI  79 year old patient who is seen today for followup. She has a history of hypertension and ongoing tobacco use. The past few days she's had some urinary frequency and dysuria. She does have a history of intermittent UTIs as well as a Cipro allergy. Urinalysis was reviewed and did reveal pyuria Denies any fever or chills or flank pain.  Past Medical History  Diagnosis Date  . GERD (gastroesophageal reflux disease)   . Hyperlipidemia   . Hypertension   . Hypothyroidism   . H/O: hematuria 07/2010    History   Social History  . Marital Status: Widowed    Spouse Name: N/A    Number of Children: 3  . Years of Education: N/A   Occupational History  . Not on file.   Social History Main Topics  . Smoking status: Current Every Day Smoker -- 0.30 packs/day    Types: Cigarettes  . Smokeless tobacco: Never Used  . Alcohol Use: No  . Drug Use: No  . Sexual Activity: Not on file   Other Topics Concern  . Not on file   Social History Narrative  . No narrative on file    Past Surgical History  Procedure Laterality Date  . Appendectomy      age 75  . Oophorectomy  1954  . Tonsillectomy      age 36  . Cholecystectomy      age 49  . Dilation and curettage of uterus      multiple's  . Fracture surgery  Dec 2009    left wrist    Family History  Problem Relation Age of Onset  . Thyroid disease Sister 30    Allergies  Allergen Reactions  . Ciprofloxacin     REACTION: hives    Current Outpatient Prescriptions on File Prior to Visit  Medication Sig Dispense Refill  . ampicillin (PRINCIPEN) 500 MG capsule Take 1 capsule (500 mg total) by mouth 4 (four) times daily.  30 capsule  0  . DIOVAN 160 MG tablet TAKE 1 TABLET DAILY  90 tablet  3  . simvastatin (ZOCOR) 10 MG tablet Take 1 tablet (10 mg total) by mouth at bedtime.  90 tablet  3  . SYNTHROID 75 MCG tablet TAKE 1  TABLET DAILY  90 tablet  3   No current facility-administered medications on file prior to visit.    BP 120/80       Review of Systems  Constitutional: Negative.   HENT: Negative for congestion, dental problem, hearing loss, rhinorrhea, sinus pressure, sore throat and tinnitus.        Status post bilateral cataract extraction surgery since her last visit  Eyes: Negative for pain, discharge and visual disturbance.  Respiratory: Negative for cough and shortness of breath.   Cardiovascular: Negative for chest pain, palpitations and leg swelling.  Gastrointestinal: Negative for nausea, vomiting, abdominal pain, diarrhea, constipation, blood in stool and abdominal distention.  Genitourinary: Positive for dysuria and frequency. Negative for urgency, hematuria, flank pain, vaginal bleeding, vaginal discharge, difficulty urinating, vaginal pain and pelvic pain.  Musculoskeletal: Negative for arthralgias, gait problem and joint swelling.  Skin: Negative for rash.  Neurological: Negative for dizziness, syncope, speech difficulty, weakness, numbness and headaches.  Hematological: Negative for adenopathy.  Psychiatric/Behavioral: Negative for behavioral problems, dysphoric mood and agitation. The patient is not nervous/anxious.  Objective:   Physical Exam  Constitutional: She is oriented to person, place, and time. She appears well-developed and well-nourished.  HENT:  Head: Normocephalic.  Right Ear: External ear normal.  Left Ear: External ear normal.  Mouth/Throat: Oropharynx is clear and moist.  Eyes: Conjunctivae and EOM are normal. Pupils are equal, round, and reactive to light.  Neck: Normal range of motion. Neck supple. No thyromegaly present.  Cardiovascular: Normal rate, regular rhythm, normal heart sounds and intact distal pulses.   Pulmonary/Chest: Effort normal and breath sounds normal.  Abdominal: Soft. Bowel sounds are normal. She exhibits no mass. There is no  tenderness.  Musculoskeletal: Normal range of motion.  Lymphadenopathy:    She has no cervical adenopathy.  Neurological: She is alert and oriented to person, place, and time.  Skin: Skin is warm and dry. No rash noted.  Psychiatric: She has a normal mood and affect. Her behavior is normal.          Assessment & Plan:   Hypertension well controlled UTI. Will treat with trimpex  for 7 days  Declines flu vaccine CPX 6 months Cessation of smoking encouraged

## 2013-07-18 NOTE — Patient Instructions (Signed)
Drink as much fluid as you  can tolerate over the next few days  Take your antibiotic as prescribed until ALL of it is gone, but stop if you develop a rash, swelling, or any side effects of the medication.  Contact our office as soon as possible if  there are side effects of the medication.  Smoking tobacco is very bad for your health. You should stop smoking immediately.  Return in 6 months for follow-up

## 2013-08-05 ENCOUNTER — Encounter: Payer: Self-pay | Admitting: Internal Medicine

## 2013-08-05 ENCOUNTER — Inpatient Hospital Stay (HOSPITAL_COMMUNITY): Payer: Medicare Other

## 2013-08-05 ENCOUNTER — Encounter (HOSPITAL_COMMUNITY): Payer: Self-pay | Admitting: Emergency Medicine

## 2013-08-05 ENCOUNTER — Ambulatory Visit (INDEPENDENT_AMBULATORY_CARE_PROVIDER_SITE_OTHER): Payer: Medicare Other | Admitting: Internal Medicine

## 2013-08-05 ENCOUNTER — Inpatient Hospital Stay (HOSPITAL_COMMUNITY)
Admission: EM | Admit: 2013-08-05 | Discharge: 2013-08-08 | DRG: 379 | Disposition: A | Discharge status: Home / Self Care | Payer: Medicare Other | Attending: Internal Medicine | Admitting: Internal Medicine

## 2013-08-05 VITALS — BP 120/80 | HR 88 | Temp 97.4°F | Resp 20 | Wt 131.0 lb

## 2013-08-05 DIAGNOSIS — K219 Gastro-esophageal reflux disease without esophagitis: Secondary | ICD-10-CM | POA: Diagnosis not present

## 2013-08-05 DIAGNOSIS — Z791 Long term (current) use of non-steroidal anti-inflammatories (NSAID): Secondary | ICD-10-CM

## 2013-08-05 DIAGNOSIS — K31811 Angiodysplasia of stomach and duodenum with bleeding: Secondary | ICD-10-CM | POA: Diagnosis not present

## 2013-08-05 DIAGNOSIS — Z7982 Long term (current) use of aspirin: Secondary | ICD-10-CM

## 2013-08-05 DIAGNOSIS — Z79899 Other long term (current) drug therapy: Secondary | ICD-10-CM

## 2013-08-05 DIAGNOSIS — I10 Essential (primary) hypertension: Secondary | ICD-10-CM | POA: Diagnosis present

## 2013-08-05 DIAGNOSIS — F17200 Nicotine dependence, unspecified, uncomplicated: Secondary | ICD-10-CM | POA: Diagnosis not present

## 2013-08-05 DIAGNOSIS — J449 Chronic obstructive pulmonary disease, unspecified: Secondary | ICD-10-CM | POA: Diagnosis not present

## 2013-08-05 DIAGNOSIS — Z860100 Personal history of colon polyps, unspecified: Secondary | ICD-10-CM

## 2013-08-05 DIAGNOSIS — R5381 Other malaise: Secondary | ICD-10-CM | POA: Diagnosis not present

## 2013-08-05 DIAGNOSIS — I951 Orthostatic hypotension: Secondary | ICD-10-CM | POA: Diagnosis present

## 2013-08-05 DIAGNOSIS — E785 Hyperlipidemia, unspecified: Secondary | ICD-10-CM | POA: Diagnosis not present

## 2013-08-05 DIAGNOSIS — D649 Anemia, unspecified: Secondary | ICD-10-CM | POA: Diagnosis present

## 2013-08-05 DIAGNOSIS — E039 Hypothyroidism, unspecified: Secondary | ICD-10-CM | POA: Diagnosis present

## 2013-08-05 DIAGNOSIS — J4489 Other specified chronic obstructive pulmonary disease: Secondary | ICD-10-CM | POA: Diagnosis present

## 2013-08-05 DIAGNOSIS — K921 Melena: Secondary | ICD-10-CM | POA: Diagnosis present

## 2013-08-05 DIAGNOSIS — R42 Dizziness and giddiness: Secondary | ICD-10-CM | POA: Diagnosis not present

## 2013-08-05 DIAGNOSIS — N390 Urinary tract infection, site not specified: Secondary | ICD-10-CM

## 2013-08-05 DIAGNOSIS — Z720 Tobacco use: Secondary | ICD-10-CM

## 2013-08-05 DIAGNOSIS — K264 Chronic or unspecified duodenal ulcer with hemorrhage: Secondary | ICD-10-CM | POA: Diagnosis present

## 2013-08-05 DIAGNOSIS — I369 Nonrheumatic tricuspid valve disorder, unspecified: Secondary | ICD-10-CM | POA: Diagnosis not present

## 2013-08-05 DIAGNOSIS — K922 Gastrointestinal hemorrhage, unspecified: Secondary | ICD-10-CM | POA: Diagnosis not present

## 2013-08-05 HISTORY — DX: Other specified postprocedural states: R11.2

## 2013-08-05 HISTORY — DX: Other specified postprocedural states: Z98.890

## 2013-08-05 LAB — COMPREHENSIVE METABOLIC PANEL WITH GFR
ALT: 11 U/L (ref 0–35)
AST: 19 U/L (ref 0–37)
Albumin: 3.3 g/dL — ABNORMAL LOW (ref 3.5–5.2)
Alkaline Phosphatase: 68 U/L (ref 39–117)
BUN: 25 mg/dL — ABNORMAL HIGH (ref 6–23)
CO2: 23 meq/L (ref 19–32)
Calcium: 9.4 mg/dL (ref 8.4–10.5)
Chloride: 105 meq/L (ref 96–112)
Creatinine, Ser: 0.71 mg/dL (ref 0.50–1.10)
GFR calc Af Amer: >90 mL/min (ref >90)
GFR calc non Af Amer: 79 mL/min — ABNORMAL LOW (ref >90)
Glucose, Bld: 102 mg/dL — ABNORMAL HIGH (ref 70–99)
Potassium: 3.9 meq/L (ref 3.5–5.1)
Sodium: 139 meq/L (ref 135–145)
Total Bilirubin: 0.3 mg/dL (ref 0.3–1.2)
Total Protein: 6.4 g/dL (ref 6.0–8.3)

## 2013-08-05 LAB — CBC WITH DIFFERENTIAL/PLATELET
Basophils Absolute: 0 10*3/uL (ref 0.0–0.1)
Eosinophils Relative: 2 % (ref 0–5)
Lymphocytes Relative: 24 % (ref 12–46)
Lymphs Abs: 1.3 10*3/uL (ref 0.7–4.0)
MCH: 29 pg (ref 26.0–34.0)
MCHC: 31.8 g/dL (ref 30.0–36.0)
MCV: 91.4 fL (ref 78.0–100.0)
Monocytes Absolute: 0.4 10*3/uL (ref 0.1–1.0)
Neutrophils Relative %: 68 % (ref 43–77)
Platelets: 236 10*3/uL (ref 150–400)
RBC: 3.48 MIL/uL — ABNORMAL LOW (ref 3.87–5.11)
RDW: 16.5 % — ABNORMAL HIGH (ref 11.5–15.5)
WBC: 5.7 10*3/uL (ref 4.0–10.5)

## 2013-08-05 LAB — TYPE AND SCREEN: ABO/RH(D): O POS

## 2013-08-05 LAB — TROPONIN I: Troponin I: <0.3 ng/mL (ref <0.30)

## 2013-08-05 LAB — ABO/RH: ABO/RH(D): O POS

## 2013-08-05 MED ORDER — ACETAMINOPHEN 650 MG RE SUPP: 650.0000 mg | Freq: Four times a day (QID) | RECTAL | Status: DC | PRN

## 2013-08-05 MED ORDER — LEVOTHYROXINE SODIUM 75 MCG PO TABS
75.0000 ug | ORAL_TABLET | Freq: Every day | ORAL | Status: DC
  Administered 2013-08-06 – 2013-08-08 (×3): 75 ug via ORAL
  Filled 2013-08-05 (×4): qty 1

## 2013-08-05 MED ORDER — OXYCODONE HCL 5 MG PO TABS: 5.0000 mg | ORAL_TABLET | ORAL | Status: DC | PRN

## 2013-08-05 MED ORDER — PANTOPRAZOLE SODIUM 40 MG IV SOLR
40.0000 mg | Freq: Once | INTRAVENOUS | Status: AC
  Administered 2013-08-05: 40 mg via INTRAVENOUS
  Filled 2013-08-05: qty 40

## 2013-08-05 MED ORDER — SODIUM CHLORIDE 0.9 % IV SOLN
INTRAVENOUS | Status: DC
  Administered 2013-08-06 – 2013-08-07 (×4): via INTRAVENOUS

## 2013-08-05 MED ORDER — SODIUM CHLORIDE 0.9 % IV BOLUS (SEPSIS)
1000.0000 mL | Freq: Once | INTRAVENOUS | Status: AC
  Administered 2013-08-05: 1000 mL via INTRAVENOUS

## 2013-08-05 MED ORDER — ACETAMINOPHEN 325 MG PO TABS
650.0000 mg | ORAL_TABLET | Freq: Four times a day (QID) | ORAL | Status: DC | PRN
  Administered 2013-08-07 – 2013-08-08 (×2): 650 mg via ORAL
  Filled 2013-08-05 (×2): qty 2

## 2013-08-05 MED ORDER — SIMVASTATIN 10 MG PO TABS
10.0000 mg | ORAL_TABLET | Freq: Every day | ORAL | Status: DC
  Administered 2013-08-05 – 2013-08-07 (×3): 10 mg via ORAL
  Filled 2013-08-05 (×4): qty 1

## 2013-08-05 MED ORDER — ASPIRIN EC 81 MG PO TBEC
81.0000 mg | DELAYED_RELEASE_TABLET | Freq: Every day | ORAL | Status: DC
  Administered 2013-08-05 – 2013-08-07 (×3): 81 mg via ORAL
  Filled 2013-08-05 (×4): qty 1

## 2013-08-05 NOTE — H&P (Signed)
[facility] History and Physical  Amey Hossain UJW:[id number] DOB: [birth year] DOA: 08/05/2013  Referring physician:  PCP: Rogelia Boga, MD  Specialists:    Chief Complaint: Orthostatic Dizziness  HPI: Dawn Woods  79yo PMHx HTN, hypothyroidism, HLD, GERD, ongoing tobacco use, Hx colonic Polyps 2006.  Presents  with a four-day history of orthostatic dizziness and lightheadedness and has noted darker stools over this period of time. No history of prior GI bleeding. She has been treated for hypertension with Diovan 160 mg once daily. She states this she had a colonoscopy "a few years ago" but apparently this was performed in [place] in 2006. Denies any abdominal pain. States was never instructed to return for followup colonoscopy. States was guaiac positive at [facility] office today.    Review of Systems: The patient denies anorexia, fever, weight loss,, vision loss, decreased hearing, hoarseness, chest pain, syncope, dyspnea on exertion, peripheral edema, balance deficits, hemoptysis, abdominal pain, severe indigestion/heartburn, hematuria, incontinence, genital sores, muscle weakness, suspicious skin lesions, transient blindness, difficulty walking, depression, unusual weight change,  enlarged lymph nodes, angioedema, and breast masses.    TRAVEL HISTORY:   None  Past Medical History  Diagnosis Date  . GERD (gastroesophageal reflux disease)   . Hyperlipidemia   . Hypertension   . Hypothyroidism   . H/O: hematuria 07/2010  . PONV (postoperative nausea and vomiting)    Past Surgical History  Procedure Laterality Date  . Appendectomy      age 54  . Oophorectomy  1954  . Tonsillectomy      age 72  . Cholecystectomy      age 40  . Dilation and curettage of uterus      multiple's  . Fracture surgery  Dec 2009    left wrist   Social History: 1/4 PPD x63 years, never used smokeless tobacco. She reports that she does not drink alcohol or use illicit  drugs.    Allergies  Allergen Reactions  . Ciprofloxacin     REACTION: hives    Family History  Problem Relation Age of Onset  . Thyroid disease Sister 54    Prior to Admission medications   Medication Sig Start Date End Date Taking? Authorizing Provider  aspirin 81 MG tablet Take 81 mg by mouth daily.   Yes Historical Provider, MD  cetirizine (ZYRTEC) 10 MG tablet Take 10 mg by mouth daily.   Yes Historical Provider, MD  ibuprofen (ADVIL,MOTRIN) 200 MG tablet Take 400 mg by mouth every 6 (six) hours as needed.   Yes Historical Provider, MD  levothyroxine (SYNTHROID, LEVOTHROID) 75 MCG tablet Take 75 mcg by mouth daily before breakfast.   Yes Historical Provider, MD  simvastatin (ZOCOR) 10 MG tablet Take 1 tablet (10 mg total) by mouth at bedtime. 01/17/13  Yes Gordy Savers, MD  valsartan (DIOVAN) 160 MG tablet Take 160 mg by mouth daily.   Yes Historical Provider, MD   Physical Exam: Filed Vitals:   08/05/13 1458 08/05/13 1542 08/05/13 1545 08/05/13 1547  BP:  124/55 130/73 77/46  Pulse: 61 65    Temp:      TempSrc:      Resp: 18 11 17 19   SpO2: 100% 97%  100%     General: A./O. x4, NAD  Cardiovascular: Regular rhythm and rate, negative murmurs rubs gallops, DP/PT pulse one plus bilateral  Respiratory: Clear to auscultation bilateral, NOTE patient's purses lips for breathing  Abdomen: Nontender, nondistended, plus bowel sounds  Skin: Negative for rash  Musculoskeletal:  Negative pedal edema, good capillary return on all metatarsals    Labs on Admission:  Basic Metabolic Panel:  Recent Labs Lab 08/05/13 1525  NA 139  K 3.9  CL 105  CO2 23  GLUCOSE 102*  BUN 25*  CREATININE 0.71  CALCIUM 9.4   Liver Function Tests:  Recent Labs Lab 08/05/13 1525  AST 19  ALT 11  ALKPHOS 68  BILITOT 0.3  PROT 6.4  ALBUMIN 3.3*   No results found for this basename: LIPASE, AMYLASE,  in the last 168 hours No results found for this basename: AMMONIA,  in  the last 168 hours CBC:  Recent Labs Lab 08/05/13 1525  WBC 5.7  NEUTROABS 3.9  HGB 10.1*  HCT 31.8*  MCV 91.4  PLT 236   Cardiac Enzymes: No results found for this basename: CKTOTAL, CKMB, CKMBINDEX, TROPONINI,  in the last 168 hours  BNP (last 3 results) No results found for this basename: PROBNP,  in the last 8760 hours CBG: No results found for this basename: GLUCAP,  in the last 168 hours  Radiological Exams on Admission: No results found.  EKG:  No previous EKG for comparison; NSR, LAD, RSR in V1 and V2 with flipped T waves, cannot rule out ischemia vs MI  Assessment/Plan Active Problems:   * No active hospital problems. *  Orthostatic hypotension;  -Bolus 1 L normal saline and then continue normal saline at 132ml/hr -Obtain troponin x3 -Obtain occult blood card x3 -Obtain TSH/free T4 -Obtain CXR  HTN -Patient orthostatic, therefore will hold all BP medication  Hypothyroidism -Continue patient's home dose of Synthroid -Obtain TSH/free T4 to rule out cause of orthostatic hypotension  Melena -Obtain occult blood card x3, if positive will consult GI  History colonic polyps 2006 -Patient has never returned for followup colonoscopy per [facility] of gastroenterology guidelines patient should have returned in 2011 for repeat colonoscopy -Contact GI in the a.m. after patient's BP more stable  Tobacco abuse -Patient states does not require nicotine patches  COPD? -When stable obtain ambulatory SpO2     Code Status: Full Family Communication: Daughter present for discussion of plan of care Disposition Plan:   Time spent: 60 minutes  Dawn Woods, Roselind Messier [facility] Pager [phone]  If 7PM-7AM, please contact night-coverage [email or web address] Password [facility] 08/05/2013, 8:17 PM

## 2013-08-05 NOTE — Progress Notes (Signed)
Subjective:    Patient ID: Dawn Woods, female    DOB: [birth year], 79 y.o.   MRN: [id number] Pre-visit discussion using our clinic review tool. No additional management support is needed unless otherwise documented below in the visit note.  HPI  79 year old patient who has a history of treated hypertension and ongoing tobacco use. She presents with a four-day history of orthostatic dizziness and lightheadedness and has noted darker stools over this period of time. No history of prior GI bleeding. She has been treated for hypertension with Diovan  160 mg once daily.  She states this she had a colonoscopy "a few years ago" but apparently this was performed in [place] in 2006.  Denies any abdominal pain  Current Allergies:  No known allergies   Past Medical History:    GERD  Hyperlipidemia  Hypertension  Hypothyroidism   Past Surgical History:   Appendectomy age 65  Oophorectomy 1954  Tonsillectomy at 78  Cholecystectomy age 27  multiple D&Cs  coloscopy 2006   Family History:   father died age 15 M. I.  mother died age 45 pneumonia  One sister, age 48. thyroid disease  Social History:  Married  3 adult children    Wt Readings from Last 3 Encounters:  08/05/13 131 lb (59.421 kg)  01/31/13 128 lb (58.06 kg)  01/17/13 132 lb (59.875 kg)    Past Medical History  Diagnosis Date  . GERD (gastroesophageal reflux disease)   . Hyperlipidemia   . Hypertension   . Hypothyroidism   . H/O: hematuria 07/2010    History   Social History  . Marital Status: Widowed    Spouse Name: N/A    Number of Children: 3  . Years of Education: N/A   Occupational History  . Not on file.   Social History Main Topics  . Smoking status: Current Every Day Smoker -- 0.30 packs/day    Types: Cigarettes  . Smokeless tobacco: Never Used  . Alcohol Use: No  . Drug Use: No  . Sexual Activity: Not on file   Other Topics Concern  . Not on file   Social History Narrative  . No  narrative on file    Past Surgical History  Procedure Laterality Date  . Appendectomy      age 46  . Oophorectomy  1954  . Tonsillectomy      age 17  . Cholecystectomy      age 8  . Dilation and curettage of uterus      multiple's  . Fracture surgery  Dec 2009    left wrist    Family History  Problem Relation Age of Onset  . Thyroid disease Sister 2    Allergies  Allergen Reactions  . Ciprofloxacin     REACTION: hives    Current Outpatient Prescriptions on File Prior to Visit  Medication Sig Dispense Refill  . DIOVAN 160 MG tablet TAKE 1 TABLET DAILY  90 tablet  3  . simvastatin (ZOCOR) 10 MG tablet Take 1 tablet (10 mg total) by mouth at bedtime.  90 tablet  3  . SYNTHROID 75 MCG tablet TAKE 1 TABLET DAILY  90 tablet  3   No current facility-administered medications on file prior to visit.    BP 120/80  Pulse 88  Temp(Src) 97.4 F (36.3 C) (Oral)  Resp 20  Wt 131 lb (59.421 kg)  SpO2 94%      Review of Systems  HENT: Negative for congestion, dental problem, hearing loss,  rhinorrhea, sinus pressure, sore throat and tinnitus.   Eyes: Negative for pain, discharge and visual disturbance.  Respiratory: Negative for cough and shortness of breath.   Cardiovascular: Negative for chest pain, palpitations and leg swelling.  Gastrointestinal: Negative for nausea, vomiting, abdominal pain, diarrhea, constipation, blood in stool and abdominal distention.  Genitourinary: Negative for dysuria, urgency, frequency, hematuria, flank pain, vaginal bleeding, vaginal discharge, difficulty urinating, vaginal pain and pelvic pain.  Musculoskeletal: Negative for arthralgias, gait problem and joint swelling.  Skin: Negative for rash.  Neurological: Positive for weakness and light-headedness. Negative for dizziness, syncope, speech difficulty, numbness and headaches.  Hematological: Negative for adenopathy.  Psychiatric/Behavioral: Negative for behavioral problems, dysphoric  mood and agitation. The patient is not nervous/anxious.        Objective:   Physical Exam  Constitutional: She is oriented to person, place, and time. She appears well-developed and well-nourished.  Blood pressure 150/70 supine 100/60 standing with mild dizziness  HENT:  Head: Normocephalic.  Right Ear: External ear normal.  Left Ear: External ear normal.  Mouth/Throat: Oropharynx is clear and moist.  Eyes: Conjunctivae and EOM are normal. Pupils are equal, round, and reactive to light.  Neck: Normal range of motion. Neck supple. No thyromegaly present.  Cardiovascular: Normal rate, regular rhythm, normal heart sounds and intact distal pulses.   Pulmonary/Chest: Effort normal and breath sounds normal.  Abdominal: Soft. Bowel sounds are normal. She exhibits no mass. There is no tenderness.  Genitourinary: Guaiac positive stool.  Musculoskeletal: Normal range of motion.  Lymphadenopathy:    She has no cervical adenopathy.  Neurological: She is alert and oriented to person, place, and time.  Skin: Skin is warm and dry. No rash noted.  Psychiatric: She has a normal mood and affect. Her behavior is normal.          Assessment & Plan:  GI bleeding with hematest-positive stool Orthostatic hypotension.  We'll contact the patient's daughter for prompt transportation and evaluation at the emergency department. Patient will require admission if significant anemia. Antihypertensive medication will be discontinued at this time History dyslipidemia Hypothyroidism GERD

## 2013-08-05 NOTE — ED Notes (Signed)
Gave report for admit. Family informed.

## 2013-08-05 NOTE — ED Notes (Signed)
Pt comes from PCP office with positive orthostatic VS. Pt c/o dizziness and dark stools since Wednesday.  Pt has PMH diverticuloitis.

## 2013-08-05 NOTE — ED Provider Notes (Signed)
CSN: [id number]     Arrival date & time 08/05/13  1427 History   First MD Initiated Contact with Patient 08/05/13 1508     Chief Complaint  Patient presents with  . Dizziness  . dark stool    (Consider location/radiation/quality/duration/timing/severity/associated sxs/prior Treatment) Patient is a 79 y.o. female presenting with weakness. The history is provided by the patient (the pt complains of weakness and dark stools).  Weakness This is a new problem. The current episode started 2 days ago. The problem occurs constantly. The problem has not changed since onset.Pertinent negatives include no chest pain, no abdominal pain and no headaches. Nothing aggravates the symptoms. Nothing relieves the symptoms.    Past Medical History  Diagnosis Date  . GERD (gastroesophageal reflux disease)   . Hyperlipidemia   . Hypertension   . Hypothyroidism   . H/O: hematuria 07/2010  . PONV (postoperative nausea and vomiting)    Past Surgical History  Procedure Laterality Date  . Appendectomy      age 39  . Oophorectomy  1954  . Tonsillectomy      age 28  . Cholecystectomy      age 44  . Dilation and curettage of uterus      multiple's  . Fracture surgery  Dec 2009    left wrist   Family History  Problem Relation Age of Onset  . Thyroid disease Sister 57   History  Substance Use Topics  . Smoking status: Current Every Day Smoker -- 0.50 packs/day for 63 years    Types: Cigarettes  . Smokeless tobacco: Never Used  . Alcohol Use: No   OB History   Grav Para Term Preterm Abortions TAB SAB Ect Mult Living                 Review of Systems  Constitutional: Negative for appetite change and fatigue.  HENT: Negative for congestion, ear discharge and sinus pressure.   Eyes: Negative for discharge.  Respiratory: Negative for cough.   Cardiovascular: Negative for chest pain.  Gastrointestinal: Negative for abdominal pain and diarrhea.  Genitourinary: Negative for frequency and  hematuria.  Musculoskeletal: Negative for back pain.  Skin: Negative for rash.  Neurological: Positive for weakness. Negative for seizures and headaches.  Psychiatric/Behavioral: Negative for hallucinations.    Allergies  Ciprofloxacin  Home Medications   Current Outpatient Rx  Name  Route  Sig  Dispense  Refill  . aspirin 81 MG tablet   Oral   Take 81 mg by mouth daily.         . cetirizine (ZYRTEC) 10 MG tablet   Oral   Take 10 mg by mouth daily.         Marland Kitchen ibuprofen (ADVIL,MOTRIN) 200 MG tablet   Oral   Take 400 mg by mouth every 6 (six) hours as needed.         Marland Kitchen levothyroxine (SYNTHROID, LEVOTHROID) 75 MCG tablet   Oral   Take 75 mcg by mouth daily before breakfast.         . simvastatin (ZOCOR) 10 MG tablet   Oral   Take 1 tablet (10 mg total) by mouth at bedtime.   90 tablet   3   . valsartan (DIOVAN) 160 MG tablet   Oral   Take 160 mg by mouth daily.          BP 77/46  Pulse 65  Temp(Src) 97.3 F (36.3 C) (Oral)  Resp 19  SpO2 100% Physical Exam  Constitutional: She is oriented to person, place, and time. She appears well-developed.  HENT:  Head: Normocephalic.  Eyes: Conjunctivae and EOM are normal. No scleral icterus.  Neck: Neck supple. No thyromegaly present.  Cardiovascular: Normal rate and regular rhythm.  Exam reveals no gallop and no friction rub.   No murmur heard. Pulmonary/Chest: No stridor. She has no wheezes. She has no rales. She exhibits no tenderness.  Abdominal: She exhibits no distension. There is no tenderness. There is no rebound.  Genitourinary:  Rectal dark stools and hem  Musculoskeletal: Normal range of motion. She exhibits no edema.  Lymphadenopathy:    She has no cervical adenopathy.  Neurological: She is oriented to person, place, and time. She exhibits normal muscle tone. Coordination normal.  Skin: No rash noted. No erythema.  Psychiatric: She has a normal mood and affect. Her behavior is normal.    ED  Course  Procedures (including critical care time) Labs Review Labs Reviewed  CBC WITH DIFFERENTIAL - Abnormal; Notable for the following:    RBC 3.48 (*)    Hemoglobin 10.1 (*)    HCT 31.8 (*)    RDW 16.5 (*)    All other components within normal limits  COMPREHENSIVE METABOLIC PANEL - Abnormal; Notable for the following:    Glucose, Bld 102 (*)    BUN 25 (*)    Albumin 3.3 (*)    GFR calc non Af Amer 79 (*)    All other components within normal limits  TYPE AND SCREEN  ABO/RH   Imaging Review No results found.  EKG Interpretation     Ventricular Rate:  61 PR Interval:  133 QRS Duration: 91 QT Interval:  431 QTC Calculation: 434 R Axis:   -31 Text Interpretation:  Sinus or ectopic atrial rhythm Left axis deviation RSR' in V1 or V2, probably normal variant            MDM   1. GI bleed        Benny Lennert, MD 08/05/13 1814

## 2013-08-05 NOTE — Patient Instructions (Signed)
Report to the emergency department immediately for further evaluation and probable hospital admission

## 2013-08-06 ENCOUNTER — Encounter (HOSPITAL_COMMUNITY): Payer: Self-pay | Admitting: *Deleted

## 2013-08-06 ENCOUNTER — Encounter (HOSPITAL_COMMUNITY)
Admission: EM | Disposition: A | Discharge status: Home / Self Care | Payer: Self-pay | Source: Home / Self Care | Attending: Internal Medicine

## 2013-08-06 DIAGNOSIS — I369 Nonrheumatic tricuspid valve disorder, unspecified: Secondary | ICD-10-CM

## 2013-08-06 HISTORY — PX: ESOPHAGOGASTRODUODENOSCOPY: SHX5428

## 2013-08-06 LAB — COMPREHENSIVE METABOLIC PANEL
AST: 15 U/L (ref 0–37)
BUN: 19 mg/dL (ref 6–23)
CO2: 24 mEq/L (ref 19–32)
Chloride: 113 mEq/L — ABNORMAL HIGH (ref 96–112)
Creatinine, Ser: 0.68 mg/dL (ref 0.50–1.10)
GFR calc Af Amer: >90 mL/min (ref >90)
GFR calc non Af Amer: 80 mL/min — ABNORMAL LOW (ref >90)
Glucose, Bld: 89 mg/dL (ref 70–99)
Total Bilirubin: 0.3 mg/dL (ref 0.3–1.2)

## 2013-08-06 LAB — CBC WITH DIFFERENTIAL/PLATELET
Basophils Absolute: 0 10*3/uL (ref 0.0–0.1)
Eosinophils Absolute: 0.2 10*3/uL (ref 0.0–0.7)
Eosinophils Relative: 4 % (ref 0–5)
Lymphocytes Relative: 33 % (ref 12–46)
MCH: 29 pg (ref 26.0–34.0)
MCV: 91.2 fL (ref 78.0–100.0)
Neutro Abs: 2.5 10*3/uL (ref 1.7–7.7)
Platelets: 189 10*3/uL (ref 150–400)
RDW: 16.2 % — ABNORMAL HIGH (ref 11.5–15.5)
WBC: 4.5 10*3/uL (ref 4.0–10.5)

## 2013-08-06 LAB — LIPID PANEL
Total CHOL/HDL Ratio: 2.4 RATIO
VLDL: 10 mg/dL (ref 0–40)

## 2013-08-06 LAB — MAGNESIUM: Magnesium: 1.8 mg/dL (ref 1.5–2.5)

## 2013-08-06 LAB — HEMOGLOBIN A1C: Hgb A1c MFr Bld: 5.9 % — ABNORMAL HIGH (ref <5.7)

## 2013-08-06 LAB — TSH: TSH: 10.573 u[IU]/mL — ABNORMAL HIGH (ref 0.350–4.500)

## 2013-08-06 LAB — T4, FREE: Free T4: 0.92 ng/dL (ref 0.80–1.80)

## 2013-08-06 SURGERY — EGD (ESOPHAGOGASTRODUODENOSCOPY)
Anesthesia: Moderate Sedation

## 2013-08-06 MED ORDER — PANTOPRAZOLE SODIUM 40 MG IV SOLR: 40.0000 mg | Freq: Two times a day (BID) | INTRAVENOUS | Status: DC

## 2013-08-06 MED ORDER — PANTOPRAZOLE SODIUM 40 MG IV SOLR
40.0000 mg | Freq: Two times a day (BID) | INTRAVENOUS | Status: DC
  Filled 2013-08-06 (×2): qty 40

## 2013-08-06 MED ORDER — DIPHENHYDRAMINE HCL 50 MG/ML IJ SOLN
INTRAMUSCULAR | Status: AC
  Filled 2013-08-06: qty 1

## 2013-08-06 MED ORDER — FENTANYL CITRATE 0.05 MG/ML IJ SOLN
INTRAMUSCULAR | Status: AC
  Filled 2013-08-06: qty 2

## 2013-08-06 MED ORDER — IRBESARTAN 75 MG PO TABS
75.0000 mg | ORAL_TABLET | Freq: Every day | ORAL | Status: DC
  Administered 2013-08-06 – 2013-08-07 (×2): 75 mg via ORAL
  Filled 2013-08-06 (×3): qty 1

## 2013-08-06 MED ORDER — MIDAZOLAM HCL 10 MG/2ML IJ SOLN
INTRAMUSCULAR | Status: AC
  Filled 2013-08-06: qty 2

## 2013-08-06 MED ORDER — ONDANSETRON HCL 4 MG/2ML IJ SOLN: 4.0000 mg | Freq: Three times a day (TID) | INTRAMUSCULAR | Status: DC | PRN

## 2013-08-06 MED ORDER — SODIUM CHLORIDE 0.9 % IV SOLN
INTRAVENOUS | Status: DC
  Administered 2013-08-06: 500 mL via INTRAVENOUS

## 2013-08-06 MED ORDER — EPINEPHRINE HCL 0.1 MG/ML IJ SOSY
PREFILLED_SYRINGE | INTRAMUSCULAR | Status: AC
  Filled 2013-08-06: qty 10

## 2013-08-06 MED ORDER — FENTANYL CITRATE 0.05 MG/ML IJ SOLN
INTRAMUSCULAR | Status: DC | PRN
  Administered 2013-08-06: 25 ug via INTRAVENOUS

## 2013-08-06 MED ORDER — MIDAZOLAM HCL 10 MG/2ML IJ SOLN
INTRAMUSCULAR | Status: DC | PRN
  Administered 2013-08-06: 1 mg via INTRAVENOUS
  Administered 2013-08-06: 2 mg via INTRAVENOUS
  Administered 2013-08-06 (×3): 1 mg via INTRAVENOUS

## 2013-08-06 MED ORDER — ONDANSETRON HCL 4 MG/2ML IJ SOLN
INTRAMUSCULAR | Status: AC
  Filled 2013-08-06: qty 2

## 2013-08-06 MED ORDER — ONDANSETRON HCL 4 MG/2ML IJ SOLN
4.0000 mg | Freq: Once | INTRAMUSCULAR | Status: AC
  Administered 2013-08-06: 4 mg via INTRAVENOUS

## 2013-08-06 MED ORDER — BUTAMBEN-TETRACAINE-BENZOCAINE 2-2-14 % EX AERO
INHALATION_SPRAY | CUTANEOUS | Status: DC | PRN
  Administered 2013-08-06: 2 via TOPICAL

## 2013-08-06 MED ORDER — SODIUM CHLORIDE 0.9 % IV SOLN
8.0000 mg/h | INTRAVENOUS | Status: DC
  Administered 2013-08-06 – 2013-08-07 (×2): 8 mg/h via INTRAVENOUS
  Filled 2013-08-06 (×4): qty 80

## 2013-08-06 NOTE — H&P (View-Only) (Signed)
Referring Provider: Dr. Joseph Art Primary Care Physician:  Rogelia Boga, MD Primary Gastroenterologist:  Gentry Fitz  Reason for Consultation:  Melena  HPI: Dawn Woods is a 79 y.o. female admitted for hypotension who has been having several days of dark brown to black stool since last Wednesday. Has had occasional achy feeling in her LLQ. +belching frequently. Denies heartburn. Takes 2 Tums each night. Takes Advil prn for headaches and has been taking it QOD lately. Takes ASA 81 mg/day. Reports polyps removed in 2006 during colonoscopy in [place]. Denies N/V. Hgb 8.2 (10.1). Daughter at bedside.  Past Medical History  Diagnosis Date  . GERD (gastroesophageal reflux disease)   . Hyperlipidemia   . Hypertension   . Hypothyroidism   . H/O: hematuria 07/2010  . PONV (postoperative nausea and vomiting)     Past Surgical History  Procedure Laterality Date  . Appendectomy      age 87  . Oophorectomy  1954  . Tonsillectomy      age 57  . Cholecystectomy      age 71  . Dilation and curettage of uterus      multiple's  . Fracture surgery  Dec 2009    left wrist    Prior to Admission medications   Medication Sig Start Date End Date Taking? Authorizing Provider  aspirin 81 MG tablet Take 81 mg by mouth daily.   Yes Historical Provider, MD  cetirizine (ZYRTEC) 10 MG tablet Take 10 mg by mouth daily.   Yes Historical Provider, MD  ibuprofen (ADVIL,MOTRIN) 200 MG tablet Take 400 mg by mouth every 6 (six) hours as needed.   Yes Historical Provider, MD  levothyroxine (SYNTHROID, LEVOTHROID) 75 MCG tablet Take 75 mcg by mouth daily before breakfast.   Yes Historical Provider, MD  simvastatin (ZOCOR) 10 MG tablet Take 1 tablet (10 mg total) by mouth at bedtime. 01/17/13  Yes Gordy Savers, MD  valsartan (DIOVAN) 160 MG tablet Take 160 mg by mouth daily.   Yes Historical Provider, MD    Scheduled Meds: . aspirin EC  81 mg Oral Daily  . levothyroxine  75 mcg Oral QAC  breakfast  . simvastatin  10 mg Oral QHS   Continuous Infusions: . sodium chloride 125 mL/hr at 08/06/13 0721   PRN Meds:.acetaminophen, acetaminophen, oxyCODONE  Allergies as of 08/05/2013 - Review Complete 08/05/2013  Allergen Reaction Noted  . Ciprofloxacin  06/15/2010    Family History  Problem Relation Age of Onset  . Thyroid disease Sister 26    History   Social History  . Marital Status: Widowed    Spouse Name: N/A    Number of Children: 3  . Years of Education: N/A   Occupational History  . Not on file.   Social History Main Topics  . Smoking status: Current Every Day Smoker -- 0.50 packs/day for 63 years    Types: Cigarettes  . Smokeless tobacco: Never Used  . Alcohol Use: No  . Drug Use: No  . Sexual Activity: Not on file   Other Topics Concern  . Not on file   Social History Narrative  . No narrative on file    Review of Systems: All negative except as stated above in HPI.  Physical Exam: Vital signs: Filed Vitals:   08/06/13 0526  BP: 134/67  Pulse: 60  Temp: 97.6 F (36.4 C)  Resp: 16   Last BM Date: 08/05/13 General:   Elderly, Alert,  Well-developed, well-nourished, pleasant, cooperative, no acute distress Lungs:  Clear throughout to auscultation.   No wheezes, crackles, or rhonchi. No acute distress. Heart:  Regular rate and rhythm; no murmurs, clicks, rubs,  or gallops. Abdomen: soft, NT, ND, +BS  Rectal:  Deferred Ext: no edema Skin: no rash  GI:  Lab Results:  Recent Labs  08/05/13 1525 08/06/13 0510  WBC 5.7 4.5  HGB 10.1* 8.2*  HCT 31.8* 25.8*  PLT 236 189   BMET  Recent Labs  08/05/13 1525 08/06/13 0510  NA 139 141  K 3.9 4.1  CL 105 113*  CO2 23 24  GLUCOSE 102* 89  BUN 25* 19  CREATININE 0.71 0.68  CALCIUM 9.4 8.0*   LFT  Recent Labs  08/06/13 0510  PROT 4.8*  ALBUMIN 2.4*  AST 15  ALT 8  ALKPHOS 51  BILITOT 0.3   PT/INR No results found for this basename: LABPROT, INR,  in the last 72  hours   Studies/Results: Dg Chest 2 View  08/05/2013   CLINICAL DATA:  Hypotension.  EXAM: CHEST  2 VIEW  COMPARISON:  09/14/2008.  FINDINGS: The heart remains normal in size. The lungs remain clear and hyperexpanded. The interstitial markings remain mildly prominent. Thoracic spine degenerative changes.  IMPRESSION: No acute abnormality. Stable changes of COPD.   Electronically Signed   By: Gordan Payment M.D.   On: 08/05/2013 20:59    Impression/Plan: 79 yo with orthostatic hypotension likely due to symptomatic anemia who has been having melena for the past 6 days. Needs EGD to look for peptic ulcer disease. I do not think a colonoscopy is needed at this time. NPO. EGD today. Change to IV PPI Q 12 hours.    LOS: 1 day   Dawn Woods C.  08/06/2013, 12:59 PM

## 2013-08-06 NOTE — Progress Notes (Addendum)
[facility] PROGRESS NOTE  Dawn Woods ZOX:[id number] DOB: [birth year] DOA: 08/05/2013 PCP: Rogelia Boga, MD  Assessment/Plan:  Orthostatic hypotension;  -Bolus 1 L normal saline and then continue normal saline at 174ml/hr  -Obtain troponin x3  -Obtain occult blood card x3 (stool overnight) -TSH= 10.57 (High), obtain FT4 -CXR results will  HTN  -BP currently running high will restart Diovan 80 mg( note substitute irbesartan 75 mg Daily)  Hypothyroidism  -Continue patient's home dose of Synthroid  -TSH= 10.57 (High), obtain FT4   Melena  -Obtain occult blood card x3, if positive will consult GI  - Consulted [facility] (Dr.Vincent Schooler)   Anemia -Patient's current H./H 8.2/25.8, admission H./H. 10.1/31.8 most likely delusional secondary to some hydration overnight for orthostatic hypotension  History colonic polyps 2006  -Patient has never returned for followup colonoscopy per [facility] of gastroenterology guidelines patient should have returned in 2011 for repeat colonoscopy  -Consulted [facility] will evaluate for need for colonoscopy/EGD   Tobacco abuse  -Patient states does not require nicotine patches  -Counseled patient on CXR findings which show COPD; counseled on need to DC smoking  COPD?  -Prior to discharge obtain ambulatory SpO2   Code Status: Full Family Communication: Present for discussion of plan of care Disposition Plan: Per GI   Consultants:  Gastroenterology( Dr Charlott Rakes)  Procedures:  CXR 08/05/2013 No acute abnormality. Stable changes of COPD  Echocardiogram; Pending    Antibiotics:    HPI/Subjective: Dawn Woods 79yo WF PMHx HTN, hypothyroidism, HLD, GERD, ongoing tobacco use, Hx colonic Polyps 2006. Presents with a four-day history of orthostatic dizziness and lightheadedness and has noted darker stools over this period of time. No history of prior GI bleeding. She has been treated for  hypertension with Diovan 160 mg once daily. She states this she had a colonoscopy "a few years ago" but apparently this was performed in [place] in 2006. Denies any abdominal pain. States was never instructed to return for followup colonoscopy. States was guaiac positive at [facility] office today. TODAY NO stool overnight, negative N./V., negative CP, negative SOB.     Objective: Filed Vitals:   08/05/13 1547 08/05/13 2018 08/05/13 2116 08/06/13 0526  BP: 77/46 126/66 140/82 134/67  Pulse:  66 61 60  Temp:  97.7 F (36.5 C) 98.3 F (36.8 C) 97.6 F (36.4 C)  TempSrc:  Oral Oral Oral  Resp: 19 20 20 16   Weight:  59.46 kg (131 lb 1.4 oz)  60.555 kg (133 lb 8 oz)  SpO2: 100% 94% 96% 97%    Intake/Output Summary (Last 24 hours) at 08/06/13 1145 Last data filed at 08/06/13 0730  Gross per 24 hour  Intake   1040 ml  Output      0 ml  Net   1040 ml   Filed Weights   08/05/13 2018 08/06/13 0526  Weight: 59.46 kg (131 lb 1.4 oz) 60.555 kg (133 lb 8 oz)    Exam: General: A./O. x4, NAD  Cardiovascular: Regular rhythm and rate, negative murmurs rubs gallops, DP/PT pulse one plus bilateral  Respiratory: Clear to auscultation bilateral, NOTE patient's purses lips for breathing  Abdomen: Nontender, nondistended, plus bowel sounds  Skin: Negative for rash    Data Reviewed: Basic Metabolic Panel:  Recent Labs Lab 08/05/13 1525 08/06/13 0510  NA 139 141  K 3.9 4.1  CL 105 113*  CO2 23 24  GLUCOSE 102* 89  BUN 25* 19  CREATININE 0.71 0.68  CALCIUM 9.4 8.0*  MG  --  1.8   Liver Function Tests:  Recent Labs Lab 08/05/13 1525 08/06/13 0510  AST 19 15  ALT 11 8  ALKPHOS 68 51  BILITOT 0.3 0.3  PROT 6.4 4.8*  ALBUMIN 3.3* 2.4*   No results found for this basename: LIPASE, AMYLASE,  in the last 168 hours No results found for this basename: AMMONIA,  in the last 168 hours CBC:  Recent Labs Lab 08/05/13 1525 08/06/13 0510  WBC 5.7 4.5  NEUTROABS 3.9 2.5  HGB  10.1* 8.2*  HCT 31.8* 25.8*  MCV 91.4 91.2  PLT 236 189   Cardiac Enzymes:  Recent Labs Lab 08/05/13 2044  TROPONINI <0.30   BNP (last 3 results) No results found for this basename: PROBNP,  in the last 8760 hours CBG: No results found for this basename: GLUCAP,  in the last 168 hours  No results found for this or any previous visit (from the past 240 hour(s)).   Studies: Dg Chest 2 View  08/05/2013   CLINICAL DATA:  Hypotension.  EXAM: CHEST  2 VIEW  COMPARISON:  09/14/2008.  FINDINGS: The heart remains normal in size. The lungs remain clear and hyperexpanded. The interstitial markings remain mildly prominent. Thoracic spine degenerative changes.  IMPRESSION: No acute abnormality. Stable changes of COPD.   Electronically Signed   By: Gordan Payment M.D.   On: 08/05/2013 20:59    Scheduled Meds: . aspirin EC  81 mg Oral Daily  . levothyroxine  75 mcg Oral QAC breakfast  . simvastatin  10 mg Oral QHS   Continuous Infusions: . sodium chloride 125 mL/hr at 08/06/13 1610    Active Problems:   HYPOTHYROIDISM   HYPERLIPIDEMIA   HYPERTENSION   GERD   Melena   Personal history of colonic polyps    Time spent: 40 min    Dawn Woods, Dawn Woods  [facility] Pager [phone]. If 7PM-7AM, please contact night-coverage at [email or web address], password [facility] 08/06/2013, 11:45 AM  LOS: 1 day

## 2013-08-06 NOTE — Progress Notes (Signed)
Echocardiogram 2D Echocardiogram has been performed.  Dawn Woods 08/06/2013, 4:00 PM

## 2013-08-06 NOTE — Op Note (Signed)
[facility] [address] [place], [address]   ENDOSCOPY PROCEDURE REPORT  PATIENT: Dawn Woods, Dawn Woods  MR#: [id number] BIRTHDATE: [birth year] , 83  yrs. old GENDER: Female  ENDOSCOPIST: Charlott Rakes, MD REFERRED [id number] team  PROCEDURE DATE:  08/06/2013 PROCEDURE:   EGD w/ control of bleeding ASA CLASS:   Class III INDICATIONS:Melena. MEDICATIONS: Fentanyl 25 mcg IV, Versed 6 mg IV, and Cetacaine spray x 2  TOPICAL ANESTHETIC:  DESCRIPTION OF PROCEDURE:   After the risks benefits and alternatives of the procedure were thoroughly explained, informed consent was obtained.  The Pentax Gastroscope [id number]  endoscope was introduced through the mouth and advanced to the second portion of the duodenum , limited by Without limitations.   The instrument was slowly withdrawn as the mucosa was fully examined.     FINDINGS: The endoscope was inserted into the oropharynx and esophagus was intubated.  The gastroesophageal junction and esophagus were normal in appearance. Endoscope was advanced into the stomach, which revealed normal appearing gastric mucosa. During evaluation of the stomach, bright red blood refluxed from the duodenal bulb into the antrum.  The endoscope was advanced into the duodenal bulb and a 5 mm linear clean-based ulcer was noted with surrounding edema. At the genu a 5 mm superficial ulcer with with blood was seen. No visible vessel was noted. The endoscope was advanced into the second portion of duodenum where several small AVMs were noted with a small amount of oozing occurring from one of the AVMs. One cc of OZH:[id number] mixture was injected near the bleeding AVM with hemostasis achieved. This AVM and another AVM nearby were fulgurated with argon plasma coagulation (APC).  The area at the genu was reevaluated and 2 cc of ION:GEXBMW was injected with complete hemostasis. The endoscope was withdrawn back into the stomach and retroflexion  revealed a normal proximal stomach. Due to technical difficulties photodocumentation of retroflexion of the stomach and the duodenal bulb ulcer were not obtained.  COMPLICATIONS: None  ENDOSCOPIC IMPRESSION:     1. Bleeding duodenal ulcer - s/p epi 2. Bleeding duodenal AVMs - s/p APC   RECOMMENDATIONS: Protonix drip; Check H. pylori serology; Ice chips ok; Follow H/Hs   REPEAT EXAM: N/A  _______________________________ Charlott Rakes, MD eSigned:  Charlott Rakes, MD 08/06/2013 5:24 PM    CC:  PATIENT NAME:  Jaelee, Laughter MR#: [id number]

## 2013-08-06 NOTE — Interval H&P Note (Signed)
History and Physical Interval Note:  08/06/2013 4:29 PM  Dawn Woods  has presented today for surgery, with the diagnosis of gib  The various methods of treatment have been discussed with the patient and family. After consideration of risks, benefits and other options for treatment, the patient has consented to  Procedure(s): ESOPHAGOGASTRODUODENOSCOPY (EGD) (N/A) as a surgical intervention .  The patient's history has been reviewed, patient examined, no change in status, stable for surgery.  I have reviewed the patient's chart and labs.  Questions were answered to the patient's satisfaction.     [name]

## 2013-08-06 NOTE — Consult Note (Signed)
Referring Provider: Dr. Woods Primary Care Physician:  KWIATKOWSKI,PETER FRANK, MD Primary Gastroenterologist:  Unassigned  Reason for Consultation:  Melena  HPI: Dawn Woods is a 79 y.o. female admitted for hypotension who has been having several days of dark brown to black stool since last Wednesday. Has had occasional achy feeling in her LLQ. +belching frequently. Denies heartburn. Takes 2 Tums each night. Takes Advil prn for headaches and has been taking it QOD lately. Takes ASA 81 mg/day. Reports polyps removed in 2006 during colonoscopy in [place]. Denies N/V. Hgb 8.2 (10.1). Daughter at bedside.  Past Medical History  Diagnosis Date  . GERD (gastroesophageal reflux disease)   . Hyperlipidemia   . Hypertension   . Hypothyroidism   . H/O: hematuria 07/2010  . PONV (postoperative nausea and vomiting)     Past Surgical History  Procedure Laterality Date  . Appendectomy      age 22  . Oophorectomy  1954  . Tonsillectomy      age 19  . Cholecystectomy      age 40  . Dilation and curettage of uterus      multiple's  . Fracture surgery  Dec 2009    left wrist    Prior to Admission medications   Medication Sig Start Date End Date Taking? Authorizing Provider  aspirin 81 MG tablet Take 81 mg by mouth daily.   Yes Historical Provider, MD  cetirizine (ZYRTEC) 10 MG tablet Take 10 mg by mouth daily.   Yes Historical Provider, MD  ibuprofen (ADVIL,MOTRIN) 200 MG tablet Take 400 mg by mouth every 6 (six) hours as needed.   Yes Historical Provider, MD  levothyroxine (SYNTHROID, LEVOTHROID) 75 MCG tablet Take 75 mcg by mouth daily before breakfast.   Yes Historical Provider, MD  simvastatin (ZOCOR) 10 MG tablet Take 1 tablet (10 mg total) by mouth at bedtime. 01/17/13  Yes Peter F Kwiatkowski, MD  valsartan (DIOVAN) 160 MG tablet Take 160 mg by mouth daily.   Yes Historical Provider, MD    Scheduled Meds: . aspirin EC  81 mg Oral Daily  . levothyroxine  75 mcg Oral QAC  breakfast  . simvastatin  10 mg Oral QHS   Continuous Infusions: . sodium chloride 125 mL/hr at 08/06/13 0721   PRN Meds:.acetaminophen, acetaminophen, oxyCODONE  Allergies as of 08/05/2013 - Review Complete 08/05/2013  Allergen Reaction Noted  . Ciprofloxacin  06/15/2010    Family History  Problem Relation Age of Onset  . Thyroid disease Sister 79    History   Social History  . Marital Status: Widowed    Spouse Name: N/A    Number of Children: 3  . Years of Education: N/A   Occupational History  . Not on file.   Social History Main Topics  . Smoking status: Current Every Day Smoker -- 0.50 packs/day for 63 years    Types: Cigarettes  . Smokeless tobacco: Never Used  . Alcohol Use: No  . Drug Use: No  . Sexual Activity: Not on file   Other Topics Concern  . Not on file   Social History Narrative  . No narrative on file    Review of Systems: All negative except as stated above in HPI.  Physical Exam: Vital signs: Filed Vitals:   08/06/13 0526  BP: 134/67  Pulse: 60  Temp: 97.6 F (36.4 C)  Resp: 16   Last BM Date: 08/05/13 General:   Elderly, Alert,  Well-developed, well-nourished, pleasant, cooperative, no acute distress Lungs:    Clear throughout to auscultation.   No wheezes, crackles, or rhonchi. No acute distress. Heart:  Regular rate and rhythm; no murmurs, clicks, rubs,  or gallops. Abdomen: soft, NT, ND, +BS  Rectal:  Deferred Ext: no edema Skin: no rash  GI:  Lab Results:  Recent Labs  08/05/13 1525 08/06/13 0510  WBC 5.7 4.5  HGB 10.1* 8.2*  HCT 31.8* 25.8*  PLT 236 189   BMET  Recent Labs  08/05/13 1525 08/06/13 0510  NA 139 141  K 3.9 4.1  CL 105 113*  CO2 23 24  GLUCOSE 102* 89  BUN 25* 19  CREATININE 0.71 0.68  CALCIUM 9.4 8.0*   LFT  Recent Labs  08/06/13 0510  PROT 4.8*  ALBUMIN 2.4*  AST 15  ALT 8  ALKPHOS 51  BILITOT 0.3   PT/INR No results found for this basename: LABPROT, INR,  in the last 72  hours   Studies/Results: Dg Chest 2 View  08/05/2013   CLINICAL DATA:  Hypotension.  EXAM: CHEST  2 VIEW  COMPARISON:  09/14/2008.  FINDINGS: The heart remains normal in size. The lungs remain clear and hyperexpanded. The interstitial markings remain mildly prominent. Thoracic spine degenerative changes.  IMPRESSION: No acute abnormality. Stable changes of COPD.   Electronically Signed   By: Steve  Reid M.D.   On: 08/05/2013 20:59    Impression/Plan: 79 yo with orthostatic hypotension likely due to symptomatic anemia who has been having melena for the past 6 days. Needs EGD to look for peptic ulcer disease. I do not think a colonoscopy is needed at this time. NPO. EGD today. Change to IV PPI Q 12 hours.    LOS: 1 day   Thanya Cegielski C.  08/06/2013, 12:59 PM   

## 2013-08-07 ENCOUNTER — Encounter (HOSPITAL_COMMUNITY): Payer: Self-pay | Admitting: Gastroenterology

## 2013-08-07 LAB — CBC WITH DIFFERENTIAL/PLATELET
Eosinophils Absolute: 0.1 10*3/uL (ref 0.0–0.7)
Eosinophils Relative: 2 % (ref 0–5)
HCT: 24.2 % — ABNORMAL LOW (ref 36.0–46.0)
Hemoglobin: 7.9 g/dL — ABNORMAL LOW (ref 12.0–15.0)
Lymphocytes Relative: 18 % (ref 12–46)
Lymphs Abs: 1.1 10*3/uL (ref 0.7–4.0)
MCH: 29.7 pg (ref 26.0–34.0)
MCV: 91 fL (ref 78.0–100.0)
Monocytes Absolute: 0.4 10*3/uL (ref 0.1–1.0)
Monocytes Relative: 7 % (ref 3–12)
Neutro Abs: 4.5 10*3/uL (ref 1.7–7.7)
Neutrophils Relative %: 73 % (ref 43–77)
Platelets: 171 10*3/uL (ref 150–400)
RBC: 2.66 MIL/uL — ABNORMAL LOW (ref 3.87–5.11)

## 2013-08-07 LAB — COMPREHENSIVE METABOLIC PANEL
ALT: 8 U/L (ref 0–35)
AST: 16 U/L (ref 0–37)
Albumin: 2.4 g/dL — ABNORMAL LOW (ref 3.5–5.2)
CO2: 21 mEq/L (ref 19–32)
Calcium: 8 mg/dL — ABNORMAL LOW (ref 8.4–10.5)
GFR calc non Af Amer: 83 mL/min — ABNORMAL LOW (ref >90)
Glucose, Bld: 82 mg/dL (ref 70–99)
Potassium: 3.6 mEq/L (ref 3.5–5.1)
Sodium: 139 mEq/L (ref 135–145)
Total Bilirubin: 0.6 mg/dL (ref 0.3–1.2)

## 2013-08-07 LAB — MAGNESIUM: Magnesium: 1.8 mg/dL (ref 1.5–2.5)

## 2013-08-07 MED ORDER — PANTOPRAZOLE SODIUM 40 MG IV SOLR
40.0000 mg | Freq: Two times a day (BID) | INTRAVENOUS | Status: DC
  Administered 2013-08-07 – 2013-08-08 (×3): 40 mg via INTRAVENOUS
  Filled 2013-08-07 (×4): qty 40

## 2013-08-07 NOTE — Evaluation (Signed)
Physical Therapy Evaluation Patient Details Name: Dawn Woods MRN: [id number] DOB: 1930-02-14 Today's Date: 08/07/2013 Time: 4098-1191 PT Time Calculation (min): 18 min  PT Assessment / Plan / Recommendation History of Present Illness  79 yo female admitted with melena, orthostatic hypotension. Pt was independent prior to admission.  Clinical Impression  On eval, pt required Min guard assist for mobility-able to ambulate ~150 feet without assistive device. Will plan to follow during stay. Recommend daily mobility with nursing.     PT Assessment  Patient needs continued PT services    Follow Up Recommendations  No PT follow up    Does the patient have the potential to tolerate intense rehabilitation      Barriers to Discharge        Equipment Recommendations  None recommended by PT    Recommendations for Other Services     Frequency Min 3X/week    Precautions / Restrictions Precautions Precautions: Fall Restrictions Weight Bearing Restrictions: No   Pertinent Vitals/Pain No c/o pain      Mobility  Bed Mobility Bed Mobility: Supine to Sit;Sit to Supine Supine to Sit: 6: Modified independent (Device/Increase time) Sit to Supine: 6: Modified independent (Device/Increase time) Transfers Transfers: Sit to Stand;Stand to Sit Sit to Stand: 4: Min guard Stand to Sit: 4: Min guard Details for Transfer Assistance: close guard Ambulation/Gait Ambulation/Gait Assistance: 4: Min guard Ambulation Distance (Feet): 150 Feet Ambulation/Gait Assistance Details: intermittently unsteady but no LOB. fatigues fairly easily Gait Pattern: Step-through pattern    Exercises     PT Diagnosis: Difficulty walking;Generalized weakness  PT Problem List: Decreased mobility;Decreased activity tolerance PT Treatment Interventions: DME instruction;Gait training;Functional mobility training;Therapeutic activities;Therapeutic exercise;Patient/family education     PT Goals(Current goals  can be found in the care plan section) Acute Rehab PT Goals Patient Stated Goal: home.  PT Goal Formulation: With patient/family Time For Goal Achievement: 08/21/13 Potential to Achieve Goals: Good  Visit Information  Last PT Received On: 08/07/13 Assistance Needed: +1 History of Present Illness: 79 yo female admitted with melena, orthostatic hypotension. Pt was independent prior to admission.       Prior Functioning  Home Living Family/patient expects to be discharged to:: Private residence Living Arrangements: Alone Type of Home: House (townhome) Home Access: Stairs to enter Secretary/administrator of Steps: 1 Home Layout: One level Home Equipment: None Communication Communication: HOH    Cognition  Cognition Arousal/Alertness: Awake/alert Behavior During Therapy: WFL for tasks assessed/performed Overall Cognitive Status: Within Functional Limits for tasks assessed    Extremity/Trunk Assessment Upper Extremity Assessment Upper Extremity Assessment: Overall WFL for tasks assessed Lower Extremity Assessment Lower Extremity Assessment: Generalized weakness Cervical / Trunk Assessment Cervical / Trunk Assessment: Normal   Balance    End of Session PT - End of Session Equipment Utilized During Treatment: Gait belt Activity Tolerance: Patient tolerated treatment well Patient left: in bed;with call bell/phone within reach;with family/visitor present  GP     Rebeca Alert, MPT Pager: [phone]

## 2013-08-07 NOTE — Progress Notes (Addendum)
Patient ID: Dawn Woods, female   DOB: 03-07-1930, 79 y.o.   MRN: [id number] [facility] Gastroenterology Progress Note  Travis Purk 79 y.o. May 25, 1930   Subjective: Resting. No complaints. Denies any bleeding overnight.  Objective: Vital signs: Filed Vitals:   08/07/13 0813  BP: 118/78  Pulse: 70  Temp: 98  Resp: 16    Physical Exam: Gen: alert, no acute distress  Abd: soft, nontender, nondistended  Lab Results:  Recent Labs  08/06/13 0510 08/07/13 0505  NA 141 139  K 4.1 3.6  CL 113* 111  CO2 24 21  GLUCOSE 89 82  BUN 19 13  CREATININE 0.68 0.61  CALCIUM 8.0* 8.0*  MG 1.8 1.8    Recent Labs  08/06/13 0510 08/07/13 0505  AST 15 16  ALT 8 8  ALKPHOS 51 51  BILITOT 0.3 0.6  PROT 4.8* 4.8*  ALBUMIN 2.4* 2.4*    Recent Labs  08/06/13 0510 08/07/13 0505  WBC 4.5 6.2  NEUTROABS 2.5 4.5  HGB 8.2* 7.9*  HCT 25.8* 24.2*  MCV 91.2 91.0  PLT 189 171      Assessment/Plan: 79 yo s/p GI bleed from duodenal ulcer and AVMs. Stable. No further bleeding. Advance diet. Change to IV PPI Q 12 hours.   Alicea Wente C. 08/07/2013, 9:04 AM

## 2013-08-07 NOTE — Progress Notes (Signed)
Patient ID: Dawn Woods, female   DOB: 10/09/1929, 79 y.o.   MRN: [id number] [facility] PROGRESS NOTE  Kortnie Stovall WUJ:[id number] DOB: 23-Apr-1930 DOA: 08/05/2013 PCP: Rogelia Boga, MD  Brief narrative: 79 yo female with PMHx HTN, hypothyroidism, HLD, GERD, ongoing tobacco use, Hx colonic Polyps 2006 presented to [facility] ED with main concern of progressively worsening dizziness and generalized weakness, dark stools. Pt denied chest pain or shortness of breath, no specific other abdominal concerns, no urinary concerns. She has had guaiac positive stool at [facility] office on the day of the admission.   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 Stable changes of COPD.     Antibiotics:  None  Code Status: Full Family Communication: Pt at bedside Disposition Plan: Home when medically stable  HPI/Subjective: No events overnight.   Objective: Filed Vitals:   08/07/13 0700 08/07/13 0811 08/07/13 0812 08/07/13 0813  BP:  135/80 126/77 118/78  Pulse:  55 61 70  Temp:      TempSrc:      Resp:      Height: 5\' 6"  (1.676 m)     Weight:      SpO2:        Intake/Output Summary (Last 24 hours) at 08/07/13 0954 Last data filed at 08/07/13 0700  Gross per 24 hour  Intake 3266.25 ml  Output      0 ml  Net 3266.25 ml    Exam:   General:  Pt is alert, follows commands appropriately, not in acute distress  Cardiovascular: Regular rate and rhythm, S1/S2, no murmurs, no rubs, no gallops  Respiratory: Clear to auscultation bilaterally,  no wheezing, no crackles, no rhonchi  Abdomen: Soft, non tender, non distended, bowel sounds present, no guarding  Extremities: No edema, pulses DP and PT palpable bilaterally  Data Reviewed: Basic Metabolic Panel:  Recent Labs Lab 08/05/13 1525 08/06/13 0510 08/07/13 0505  NA 139 141 139  K 3.9 4.1 3.6  CL 105 113* 111  CO2 23 24 21   GLUCOSE 102* 89 82  BUN 25* 19 13  CREATININE 0.71 0.68 0.61  CALCIUM 9.4 8.0* 8.0*  MG  --  1.8 1.8   Liver Function Tests:  Recent Labs Lab 08/05/13 1525 08/06/13 0510 08/07/13 0505  AST 19 15 16   ALT 11 8 8   ALKPHOS 68 51 51  BILITOT 0.3 0.3 0.6  PROT 6.4 4.8* 4.8*  ALBUMIN 3.3* 2.4* 2.4*  CBC:  Recent Labs Lab 08/05/13 1525 08/06/13 0510 08/07/13 0505  WBC 5.7 4.5 6.2  NEUTROABS 3.9 2.5 4.5  HGB 10.1* 8.2* 7.9*  HCT 31.8* 25.8* 24.2*  MCV 91.4 91.2 91.0  PLT 236 189 171   Cardiac Enzymes:  Recent Labs Lab 08/05/13 2044  TROPONINI <0.30    Scheduled Meds: . aspirin EC  81 mg Oral Daily  . irbesartan  75 mg Oral Daily  . levothyroxine  75 mcg Oral QAC breakfast  . pantoprazole (PROTONIX) IV  40 mg Intravenous Q12H  . simvastatin  10 mg Oral  QHS   Continuous Infusions: . sodium chloride 125 mL/hr at 08/07/13 4010     Debbora Presto, MD  [facility] Pager [phone]  If 7PM-7AM, please contact night-coverage [email or web address] Password [email or web address] 08/07/2013, 9:54 AM   LOS: 2 days

## 2013-08-08 DIAGNOSIS — K219 Gastro-esophageal reflux disease without esophagitis: Secondary | ICD-10-CM

## 2013-08-08 LAB — CBC WITH DIFFERENTIAL/PLATELET
Basophils Absolute: 0 10*3/uL (ref 0.0–0.1)
Basophils Relative: 1 % (ref 0–1)
Eosinophils Absolute: 0.2 10*3/uL (ref 0.0–0.7)
Eosinophils Relative: 4 % (ref 0–5)
Lymphocytes Relative: 17 % (ref 12–46)
Lymphs Abs: 1 10*3/uL (ref 0.7–4.0)
MCH: 28.8 pg (ref 26.0–34.0)
MCV: 90 fL (ref 78.0–100.0)
Neutro Abs: 4.3 10*3/uL (ref 1.7–7.7)
Neutrophils Relative %: 71 % (ref 43–77)
Platelets: 222 10*3/uL (ref 150–400)
RBC: 3.09 MIL/uL — ABNORMAL LOW (ref 3.87–5.11)
RDW: 16 % — ABNORMAL HIGH (ref 11.5–15.5)
WBC: 6 10*3/uL (ref 4.0–10.5)

## 2013-08-08 LAB — COMPREHENSIVE METABOLIC PANEL
BUN: 10 mg/dL (ref 6–23)
CO2: 23 mEq/L (ref 19–32)
Calcium: 8.7 mg/dL (ref 8.4–10.5)
Chloride: 108 mEq/L (ref 96–112)
Creatinine, Ser: 0.68 mg/dL (ref 0.50–1.10)
GFR calc Af Amer: >90 mL/min (ref >90)
GFR calc non Af Amer: 80 mL/min — ABNORMAL LOW (ref >90)
Glucose, Bld: 94 mg/dL (ref 70–99)
Sodium: 139 mEq/L (ref 135–145)
Total Bilirubin: 0.6 mg/dL (ref 0.3–1.2)
Total Protein: 5.5 g/dL — ABNORMAL LOW (ref 6.0–8.3)

## 2013-08-08 LAB — MAGNESIUM: Magnesium: 1.8 mg/dL (ref 1.5–2.5)

## 2013-08-08 MED ORDER — OXYCODONE HCL 5 MG PO TABS: 5.0000 mg | ORAL_TABLET | ORAL | Status: DC | PRN

## 2013-08-08 MED ORDER — PANTOPRAZOLE SODIUM 40 MG PO TBEC: 40.0000 mg | DELAYED_RELEASE_TABLET | Freq: Two times a day (BID) | ORAL | Status: DC

## 2013-08-08 MED ORDER — VALSARTAN 80 MG PO TABS: 80.0000 mg | ORAL_TABLET | Freq: Every day | ORAL | Status: DC

## 2013-08-08 NOTE — Discharge Summary (Signed)
Physician Discharge Summary  Dawn Woods WUJ:[id number] DOB: 10/31/1929 DOA: 08/05/2013  PCP: Rogelia Boga, MD  Admit date: 08/05/2013 Discharge date: 08/08/2013  Recommendations for Outpatient Follow-up:  1. Pt will need to follow up with PCP in 2-3 weeks post discharge 2. Please obtain BMP to evaluate electrolytes and kidney function 3. Please also check CBC to evaluate Hg and Hct levels 4. Pt advised to follow up with Dr. Bosie Clos   Discharge Diagnoses: Melena secondary to AVM  Principal Problem:   Melena Active Problems:   HYPERTENSION   HYPOTHYROIDISM   HYPERLIPIDEMIA   GERD   Personal history of colonic polyps  Discharge Condition: Stable  Diet recommendation: Heart healthy diet discussed in details   Brief narrative:  79 yo female with PMHx HTN, hypothyroidism, HLD, GERD, ongoing tobacco use, Hx colonic Polyps 2006 presented to [facility] ED with main concern of progressively worsening dizziness and generalized weakness, dark stools. Pt denied chest pain or shortness of breath, no specific other abdominal concerns, no urinary concerns. She has had guaiac positive stool at [facility] office on the day of the admission.   Principal Problem:  Melena  - secondary to AVM and duodenal ulcer  - appreciate GI input  - no additional melena noted while inpatient  - Hg remains stable  - changed protonix to Q12 PO  Active Problems:  HYPERTENSION  - reasonable inpatient control  HYPOTHYROIDISM  - continue synthroid  HYPERLIPIDEMIA  - continue statin  GERD  - continue PPI as noted above  Personal history of colonic polyps   Consultants:  GI Procedures/Studies:  Dg Chest 2 View 08/05/2013 No acute abnormality. Stable changes of COPD.  Antibiotics:  None  Code Status: Full  Family Communication: Pt at bedside  Discharge Exam: Filed Vitals:   08/08/13 1045  BP: 177/80  Pulse: 61  Temp: 97.4 F (36.3 C)  Resp:    Filed Vitals:   08/08/13 0500  08/08/13 0610 08/08/13 0847 08/08/13 1045  BP:  154/65 176/77 177/80  Pulse:  56 52 61  Temp:  97.9 F (36.6 C) 97.3 F (36.3 C) 97.4 F (36.3 C)  TempSrc:  Oral Oral Oral  Resp:  16 16   Height:      Weight: 66.996 kg (147 lb 11.2 oz)     SpO2:  95% 97% 96%    General: Pt is alert, follows commands appropriately, not in acute distress Cardiovascular: Regular rate and rhythm, S1/S2 +, no murmurs, no rubs, no gallops Respiratory: Clear to auscultation bilaterally, no wheezing, no crackles, no rhonchi Abdominal: Soft, non tender, non distended, bowel sounds +, no guarding Extremities: no edema, no cyanosis, pulses palpable bilaterally DP and PT  Discharge Instructions  Discharge Orders   Future Orders Complete By Expires   Diet - low sodium heart healthy  As directed    Increase activity slowly  As directed     Stop taking aspirin    Medication List         cetirizine 10 MG tablet  Commonly known as:  ZYRTEC  Take 10 mg by mouth daily.     ibuprofen 200 MG tablet  Commonly known as:  ADVIL,MOTRIN  Take 400 mg by mouth every 6 (six) hours as needed.     levothyroxine 75 MCG tablet  Commonly known as:  SYNTHROID, LEVOTHROID  Take 75 mcg by mouth daily before breakfast.     oxyCODONE 5 MG immediate release tablet  Commonly known as:  Oxy IR/ROXICODONE  Take 1 tablet (  5 mg total) by mouth every 4 (four) hours as needed for moderate pain.     pantoprazole 40 MG tablet  Commonly known as:  PROTONIX  Take 1 tablet (40 mg total) by mouth 2 (two) times daily.     simvastatin 10 MG tablet  Commonly known as:  ZOCOR  Take 1 tablet (10 mg total) by mouth at bedtime.     valsartan 80 MG tablet  Commonly known as:  DIOVAN  Take 1 tablet (80 mg total) by mouth daily.           Follow-up Information   Follow up with Rogelia Boga, MD In 2 weeks.   Specialty:  Internal Medicine   Contact information:   [address] [place]  [address] [phone]       Follow up with Shirley Friar., MD In 2 weeks.   Specialty:  Gastroenterology   Contact information:   [address], [address] [place] [address] [phone]       Follow up with Debbora Presto, MD. (call my cell phone with any questions [phone])    Specialty:  Internal Medicine   Contact information:   [address] [place] [address] [phone]        The results of significant diagnostics from this hospitalization (including imaging, microbiology, ancillary and laboratory) are listed below for reference.     Microbiology: No results found for this or any previous visit (from the past 240 hour(s)).   Labs: Basic Metabolic Panel:  Recent Labs Lab 08/05/13 1525 08/06/13 0510 08/07/13 0505 08/08/13 0529  NA 139 141 139 139  K 3.9 4.1 3.6 3.6  CL 105 113* 111 108  CO2 23 24 21 23   GLUCOSE 102* 89 82 94  BUN 25* 19 13 10   CREATININE 0.71 0.68 0.61 0.68  CALCIUM 9.4 8.0* 8.0* 8.7  MG  --  1.8 1.8 1.8   Liver Function Tests:  Recent Labs Lab 08/05/13 1525 08/06/13 0510 08/07/13 0505 08/08/13 0529  AST 19 15 16 21   ALT 11 8 8 14   ALKPHOS 68 51 51 61  BILITOT 0.3 0.3 0.6 0.6  PROT 6.4 4.8* 4.8* 5.5*  ALBUMIN 3.3* 2.4* 2.4* 2.8*   CBC:  Recent Labs Lab 08/05/13 1525 08/06/13 0510 08/07/13 0505 08/08/13 0529  WBC 5.7 4.5 6.2 6.0  NEUTROABS 3.9 2.5 4.5 4.3  HGB 10.1* 8.2* 7.9* 8.9*  HCT 31.8* 25.8* 24.2* 27.8*  MCV 91.4 91.2 91.0 90.0  PLT 236 189 171 222   Cardiac Enzymes:  Recent Labs Lab 08/05/13 2044  TROPONINI <0.30   SIGNED: Time coordinating discharge: Over 30 minutes  Debbora Presto, MD  [facility] 08/08/2013, 11:45 AM Pager [phone]  If 7PM-7AM, please contact night-coverage [email or web address] Password [email or web address]

## 2013-08-08 NOTE — Progress Notes (Signed)
Patient given discharge instructions she verbalized understanding, will be transported home by daughter and she left in stable medical condition also agreeable to discharge plan. Georga Hacking RN

## 2013-08-21 ENCOUNTER — Ambulatory Visit (INDEPENDENT_AMBULATORY_CARE_PROVIDER_SITE_OTHER): Payer: Medicare Other | Admitting: Internal Medicine

## 2013-08-21 ENCOUNTER — Encounter: Payer: Self-pay | Admitting: Internal Medicine

## 2013-08-21 VITALS — BP 100/64 | HR 67 | Temp 97.7°F | Resp 20 | Wt 131.0 lb

## 2013-08-21 DIAGNOSIS — E039 Hypothyroidism, unspecified: Secondary | ICD-10-CM

## 2013-08-21 DIAGNOSIS — K921 Melena: Secondary | ICD-10-CM

## 2013-08-21 DIAGNOSIS — E785 Hyperlipidemia, unspecified: Secondary | ICD-10-CM

## 2013-08-21 DIAGNOSIS — I10 Essential (primary) hypertension: Secondary | ICD-10-CM

## 2013-08-21 DIAGNOSIS — D500 Iron deficiency anemia secondary to blood loss (chronic): Secondary | ICD-10-CM

## 2013-08-21 LAB — CBC WITH DIFFERENTIAL/PLATELET
Basophils Absolute: 0 10*3/uL (ref 0.0–0.1)
HCT: 30.4 % — ABNORMAL LOW (ref 36.0–46.0)
Hemoglobin: 9.7 g/dL — ABNORMAL LOW (ref 12.0–15.0)
Lymphocytes Relative: 8.9 % — ABNORMAL LOW (ref 12.0–46.0)
Monocytes Relative: 8.9 % (ref 3.0–12.0)
Neutro Abs: 9.1 10*3/uL — ABNORMAL HIGH (ref 1.4–7.7)
Platelets: 320 10*3/uL (ref 150.0–400.0)
RBC: 3.58 Mil/uL — ABNORMAL LOW (ref 3.87–5.11)
RDW: 16.8 % — ABNORMAL HIGH (ref 11.5–14.6)

## 2013-08-21 LAB — BASIC METABOLIC PANEL
CO2: 27 mEq/L (ref 19–32)
Calcium: 9 mg/dL (ref 8.4–10.5)
Chloride: 105 mEq/L (ref 96–112)
Creatinine, Ser: 0.8 mg/dL (ref 0.4–1.2)
Glucose, Bld: 122 mg/dL — ABNORMAL HIGH (ref 70–99)
Sodium: 138 mEq/L (ref 135–145)

## 2013-08-21 NOTE — Patient Instructions (Signed)
Take an iron supplement twice daily  Continue to hold valsartan  Return in one month for follow-up

## 2013-08-21 NOTE — Progress Notes (Signed)
Subjective:    Patient ID: Dawn Woods, female    DOB: 1930-08-17, 79 y.o.   MRN: [id number]  HPI Pre-visit discussion using our clinic review tool. No additional management support is needed unless otherwise documented below in the visit note.  79 year old patient who is seen today following hospital discharge for GI bleeding hypotension and anemia. Since her discharge she has done quite well. Blood pressure medications are on hold. Hemoglobin dropped as low as 7.9 but did not require any blood transfusions. Denies any orthostatic symptoms. No further melena. The patient is not on any iron supplementations  Hospital records reviewed  The patient has successfully discontinued tobacco products since her discharge The Admit date: 08/05/2013  Discharge date: 08/08/2013  Recommendations for Outpatient Follow-up:  1. Pt will need to follow up with PCP in 2-3 weeks post discharge 2. Please obtain BMP to evaluate electrolytes and kidney function 3. Please also check CBC to evaluate Hg and Hct levels 4. Pt advised to follow up with Dr. Bosie Clos  Discharge Diagnoses: Melena secondary to AVM  Principal Problem:  Melena  Active Problems:  HYPERTENSION  HYPOTHYROIDISM  HYPERLIPIDEMIA  GERD  Personal history of colonic polyps  Discharge Condition: Stable  Diet recommendation: Heart healthy diet discussed in details  Brief narrative:  79 yo female with PMHx HTN, hypothyroidism, HLD, GERD, ongoing tobacco use, Hx colonic Polyps 2006 presented to [facility] ED with main concern of progressively worsening dizziness and generalized weakness, dark stools. Pt denied chest pain or shortness of breath, no specific other abdominal concerns, no urinary concerns. She has had guaiac positive stool at [facility] office on the day of the admission.  Principal Problem:  Melena  - secondary to AVM and duodenal ulcer  - appreciate GI input  - no additional melena noted while inpatient  - Hg remains stable   - changed protonix to Q12 PO  Active Problems:  HYPERTENSION  - reasonable inpatient control  HYPOTHYROIDISM  - continue synthroid  HYPERLIPIDEMIA  - continue statin  GERD  - continue PPI as noted above  Personal history of colonic polyps  Consultants:  GI Procedures/Studies:  Dg Chest 2 View 08/05/2013 No acute abnormality. Stable changes of COPD.  Antibiotics:  None     Review of Systems  Constitutional: Negative.   HENT: Negative for congestion, dental problem, hearing loss, rhinorrhea, sinus pressure, sore throat and tinnitus.   Eyes: Negative for pain, discharge and visual disturbance.  Respiratory: Negative for cough and shortness of breath.   Cardiovascular: Negative for chest pain, palpitations and leg swelling.  Gastrointestinal: Negative for nausea, vomiting, abdominal pain, diarrhea, constipation, blood in stool and abdominal distention.  Genitourinary: Negative for dysuria, urgency, frequency, hematuria, flank pain, vaginal bleeding, vaginal discharge, difficulty urinating, vaginal pain and pelvic pain.  Musculoskeletal: Negative for arthralgias, gait problem and joint swelling.  Skin: Negative for rash.  Neurological: Negative for dizziness, syncope, speech difficulty, weakness, numbness and headaches.  Hematological: Negative for adenopathy.  Psychiatric/Behavioral: Negative for behavioral problems, dysphoric mood and agitation. The patient is not nervous/anxious.        Objective:   Physical Exam  Constitutional: She is oriented to person, place, and time. She appears well-developed and well-nourished.  HENT:  Head: Normocephalic.  Right Ear: External ear normal.  Left Ear: External ear normal.  Mouth/Throat: Oropharynx is clear and moist.  Eyes: Conjunctivae and EOM are normal. Pupils are equal, round, and reactive to light.  Neck: Normal range of motion. Neck supple. No thyromegaly present.  Cardiovascular: Normal rate, regular rhythm, normal heart  sounds and intact distal pulses.   Pulmonary/Chest: Effort normal and breath sounds normal.  Abdominal: Soft. Bowel sounds are normal. She exhibits no mass. There is no tenderness.  Musculoskeletal: Normal range of motion.  Lymphadenopathy:    She has no cervical adenopathy.  Neurological: She is alert and oriented to person, place, and time.  Skin: Skin is warm and dry. No rash noted.  Psychiatric: She has a normal mood and affect. Her behavior is normal.          Assessment & Plan:   Status post GI bleed secondary to duodenal ulcer and AVMs. We'll check a CBC. Continue PPI therapy History of hypertension. Blood pressure currently low normal without orthostatic changes. We'll continue to hold Secondary anemia. We'll check CBC iron supplements will be started; recheck one month

## 2013-08-21 NOTE — Progress Notes (Signed)
Pre-visit discussion using our clinic review tool. No additional management support is needed unless otherwise documented below in the visit note.  

## 2013-09-09 ENCOUNTER — Other Ambulatory Visit: Payer: Self-pay | Admitting: Internal Medicine

## 2013-09-18 ENCOUNTER — Ambulatory Visit (INDEPENDENT_AMBULATORY_CARE_PROVIDER_SITE_OTHER): Payer: Medicare Other | Admitting: Internal Medicine

## 2013-09-18 ENCOUNTER — Encounter: Payer: Self-pay | Admitting: Internal Medicine

## 2013-09-18 VITALS — BP 110/80 | HR 68 | Wt 130.0 lb

## 2013-09-18 DIAGNOSIS — E039 Hypothyroidism, unspecified: Secondary | ICD-10-CM | POA: Diagnosis not present

## 2013-09-18 DIAGNOSIS — E785 Hyperlipidemia, unspecified: Secondary | ICD-10-CM | POA: Diagnosis not present

## 2013-09-18 DIAGNOSIS — I10 Essential (primary) hypertension: Secondary | ICD-10-CM

## 2013-09-18 DIAGNOSIS — K921 Melena: Secondary | ICD-10-CM | POA: Diagnosis not present

## 2013-09-18 LAB — CBC WITH DIFFERENTIAL/PLATELET
Basophils Relative: 0 % (ref 0.0–3.0)
Eosinophils Relative: 1.9 % (ref 0.0–5.0)
HCT: 39.8 % (ref 36.0–46.0)
Lymphs Abs: 1.1 10*3/uL (ref 0.7–4.0)
MCHC: 31.6 g/dL (ref 30.0–36.0)
MCV: 83.3 fl (ref 78.0–100.0)
Monocytes Absolute: 0.5 10*3/uL (ref 0.1–1.0)
Monocytes Relative: 5.4 % (ref 3.0–12.0)
Neutrophils Relative %: 79.8 % — ABNORMAL HIGH (ref 43.0–77.0)
RBC: 4.78 Mil/uL (ref 3.87–5.11)
WBC: 8.8 10*3/uL (ref 4.5–10.5)

## 2013-09-18 LAB — TSH: TSH: 2.38 u[IU]/mL (ref 0.35–5.50)

## 2013-09-18 NOTE — Progress Notes (Signed)
Subjective:    Patient ID: Dawn Woods, female    DOB: 1930-05-25, 79 y.o.   MRN: [id number]  HPI  79 year old patient who is seen today for followup. She was hospitalized briefly for evaluation of melena. She presently is on iron supplement and hematocrit continues to improve. She feels well and feels that she is back to baseline. She is on daily iron and stools remained dark. She has been maintained off antihypertensives due to the recent anemia and hypotension.  Past Medical History  Diagnosis Date  . GERD (gastroesophageal reflux disease)   . Hyperlipidemia   . Hypertension   . Hypothyroidism   . H/O: hematuria 07/2010  . PONV (postoperative nausea and vomiting)     History   Social History  . Marital Status: Widowed    Spouse Name: N/A    Number of Children: 3  . Years of Education: N/A   Occupational History  . Not on file.   Social History Main Topics  . Smoking status: Current Every Day Smoker -- 0.50 packs/day for 63 years    Types: Cigarettes  . Smokeless tobacco: Never Used  . Alcohol Use: No  . Drug Use: No  . Sexual Activity: Not on file   Other Topics Concern  . Not on file   Social History Narrative  . No narrative on file    Past Surgical History  Procedure Laterality Date  . Appendectomy      age 11  . Oophorectomy  1954  . Tonsillectomy      age 43  . Cholecystectomy      age 13  . Dilation and curettage of uterus      multiple's  . Fracture surgery  Dec 2009    left wrist  . Esophagogastroduodenoscopy N/A 08/06/2013    Procedure: ESOPHAGOGASTRODUODENOSCOPY (EGD);  Surgeon: Shirley Friar, MD;  Location: [facility];  Service: Endoscopy;  Laterality: N/A;    Family History  Problem Relation Age of Onset  . Thyroid disease Sister 48    Allergies  Allergen Reactions  . Aspirin Other (See Comments)    GI Bleed  . Ibuprofen Other (See Comments)    GI Bleed  . Ciprofloxacin     REACTION: hives    Current Outpatient  Prescriptions on File Prior to Visit  Medication Sig Dispense Refill  . cetirizine (ZYRTEC) 10 MG tablet Take 10 mg by mouth daily.      . pantoprazole (PROTONIX) 40 MG tablet Take 1 tablet (40 mg total) by mouth 2 (two) times daily.  60 tablet  1  . simvastatin (ZOCOR) 10 MG tablet TAKE 1 TABLET AT BEDTIME  90 tablet  1  . SYNTHROID 75 MCG tablet TAKE 1 TABLET DAILY  90 tablet  1  . valsartan (DIOVAN) 80 MG tablet Take 1 tablet (80 mg total) by mouth daily.  30 tablet  1   No current facility-administered medications on file prior to visit.    BP 110/80  Pulse 68  Wt 130 lb (58.968 kg)       Review of Systems  Constitutional: Negative.   HENT: Negative for congestion, dental problem, hearing loss, rhinorrhea, sinus pressure, sore throat and tinnitus.   Eyes: Negative for pain, discharge and visual disturbance.  Respiratory: Negative for cough and shortness of breath.   Cardiovascular: Negative for chest pain, palpitations and leg swelling.  Gastrointestinal: Negative for nausea, vomiting, abdominal pain, diarrhea, constipation, blood in stool and abdominal distention.  Genitourinary: Negative for  dysuria, urgency, frequency, hematuria, flank pain, vaginal bleeding, vaginal discharge, difficulty urinating, vaginal pain and pelvic pain.  Musculoskeletal: Negative for arthralgias, gait problem and joint swelling.  Skin: Negative for rash.  Neurological: Negative for dizziness, syncope, speech difficulty, weakness, numbness and headaches.  Hematological: Negative for adenopathy.  Psychiatric/Behavioral: Negative for behavioral problems, dysphoric mood and agitation. The patient is not nervous/anxious.        Objective:   Physical Exam  Constitutional: She is oriented to person, place, and time. She appears well-developed and well-nourished.  HENT:  Head: Normocephalic.  Right Ear: External ear normal.  Left Ear: External ear normal.  Mouth/Throat: Oropharynx is clear and  moist.  Eyes: Conjunctivae and EOM are normal. Pupils are equal, round, and reactive to light.  Neck: Normal range of motion. Neck supple. No thyromegaly present.  Cardiovascular: Normal rate, regular rhythm, normal heart sounds and intact distal pulses.   Pulmonary/Chest: Effort normal and breath sounds normal.  Abdominal: Soft. Bowel sounds are normal. She exhibits no mass. There is no tenderness.  Musculoskeletal: Normal range of motion.  Lymphadenopathy:    She has no cervical adenopathy.  Neurological: She is alert and oriented to person, place, and time.  Skin: Skin is warm and dry. No rash noted.  Psychiatric: She has a normal mood and affect. Her behavior is normal.          Assessment & Plan:   History melena.  We'll continue iron supplementation and continue to hold Diovan Hypertension. Currently normotensive off medication History of hypothyroidism and elevated TSH. We'll repeat. If still elevated will consider up titration of thyroid supplement

## 2013-09-18 NOTE — Patient Instructions (Signed)
Limit your sodium (Salt) intake  Continue iron supplementation  Return in 3 months for follow-up   Hold Diovan  Please check your blood pressure on a regular basis.  If it is consistently greater than 150/90, resume Diovan

## 2013-09-18 NOTE — Progress Notes (Signed)
Pre visit review using our clinic review tool, if applicable. No additional management support is needed unless otherwise documented below in the visit note. 

## 2013-12-18 ENCOUNTER — Encounter: Payer: Self-pay | Admitting: Internal Medicine

## 2013-12-18 ENCOUNTER — Telehealth: Payer: Self-pay | Admitting: Internal Medicine

## 2013-12-18 ENCOUNTER — Ambulatory Visit (INDEPENDENT_AMBULATORY_CARE_PROVIDER_SITE_OTHER): Payer: Medicare Other | Admitting: Internal Medicine

## 2013-12-18 VITALS — BP 160/90 | HR 70 | Temp 97.6°F | Resp 20 | Ht 66.0 in | Wt 141.0 lb

## 2013-12-18 DIAGNOSIS — E785 Hyperlipidemia, unspecified: Secondary | ICD-10-CM

## 2013-12-18 DIAGNOSIS — E039 Hypothyroidism, unspecified: Secondary | ICD-10-CM | POA: Diagnosis not present

## 2013-12-18 DIAGNOSIS — R350 Frequency of micturition: Secondary | ICD-10-CM

## 2013-12-18 DIAGNOSIS — N390 Urinary tract infection, site not specified: Secondary | ICD-10-CM

## 2013-12-18 DIAGNOSIS — I10 Essential (primary) hypertension: Secondary | ICD-10-CM

## 2013-12-18 DIAGNOSIS — H1010 Acute atopic conjunctivitis, unspecified eye: Secondary | ICD-10-CM | POA: Diagnosis not present

## 2013-12-18 LAB — POCT URINALYSIS DIPSTICK
GLUCOSE UA: NEGATIVE
NITRITE UA: NEGATIVE
Spec Grav, UA: 1.02
UROBILINOGEN UA: 0.2
pH, UA: 5

## 2013-12-18 MED ORDER — CIPROFLOXACIN HCL 500 MG PO TABS: 500.0000 mg | ORAL_TABLET | Freq: Two times a day (BID) | ORAL | Status: DC

## 2013-12-18 NOTE — Progress Notes (Signed)
Subjective:    Patient ID: Dawn Woods, female    DOB: 25-Jun-1930, 80 y.o.   MRN: [id number]  HPI  80 year old patient who is seen today for her quarterly followup.  She has a history of hypertension, but grossly has been observed off medication.  Resting blood pressure readings were in a low-normal range.  She remains off tobacco products.  She was hospitalized late last year for melena and follow CBC last visit revealed normal hematocrit.  She feels well today except for urinary frequency and dysuria  BP Readings from Last 3 Encounters:  12/18/13 160/90  09/18/13 110/80  08/21/13 100/64   Past Medical History  Diagnosis Date  . GERD (gastroesophageal reflux disease)   . Hyperlipidemia   . Hypertension   . Hypothyroidism   . H/O: hematuria 07/2010  . PONV (postoperative nausea and vomiting)     History   Social History  . Marital Status: Widowed    Spouse Name: N/A    Number of Children: 3  . Years of Education: N/A   Occupational History  . Not on file.   Social History Main Topics  . Smoking status: Former Smoker -- 0.50 packs/day for 63 years    Types: Cigarettes    Quit date: 08/05/2013  . Smokeless tobacco: Never Used  . Alcohol Use: No  . Drug Use: No  . Sexual Activity: Not on file   Other Topics Concern  . Not on file   Social History Narrative  . No narrative on file    Past Surgical History  Procedure Laterality Date  . Appendectomy      age 45  . Oophorectomy  1954  . Tonsillectomy      age 58  . Cholecystectomy      age 65  . Dilation and curettage of uterus      multiple's  . Fracture surgery  Dec 2009    left wrist  . Esophagogastroduodenoscopy N/A 08/06/2013    Procedure: ESOPHAGOGASTRODUODENOSCOPY (EGD);  Surgeon: Lear Ng, MD;  Location: [facility];  Service: Endoscopy;  Laterality: N/A;    Family History  Problem Relation Age of Onset  . Thyroid disease Sister 19    Allergies  Allergen Reactions  . Aspirin  Other (See Comments)    GI Bleed  . Ibuprofen Other (See Comments)    GI Bleed  . Ciprofloxacin     REACTION: hives    Current Outpatient Prescriptions on File Prior to Visit  Medication Sig Dispense Refill  . cetirizine (ZYRTEC) 10 MG tablet Take 10 mg by mouth daily.      . ferrous sulfate 325 (65 FE) MG tablet Take 325 mg by mouth daily with breakfast.      . pantoprazole (PROTONIX) 40 MG tablet Take 1 tablet (40 mg total) by mouth 2 (two) times daily.  60 tablet  1  . simvastatin (ZOCOR) 10 MG tablet TAKE 1 TABLET AT BEDTIME  90 tablet  1  . SYNTHROID 75 MCG tablet TAKE 1 TABLET DAILY  90 tablet  1   No current facility-administered medications on file prior to visit.    BP 160/90  Pulse 70  Temp(Src) 97.6 F (36.4 C) (Oral)  Resp 20  Ht 5\' 6"  (1.676 m)  Wt 141 lb (63.957 kg)  BMI 22.77 kg/m2  SpO2 95%     Review of Systems  Constitutional: Negative.   HENT: Negative for congestion, dental problem, hearing loss, rhinorrhea, sinus pressure, sore throat and tinnitus.  Eyes: Negative for pain, discharge and visual disturbance.  Respiratory: Negative for cough and shortness of breath.   Cardiovascular: Negative for chest pain, palpitations and leg swelling.  Gastrointestinal: Negative for nausea, vomiting, abdominal pain, diarrhea, constipation, blood in stool and abdominal distention.  Genitourinary: Positive for dysuria, urgency and frequency. Negative for hematuria, flank pain, vaginal bleeding, vaginal discharge, difficulty urinating, vaginal pain and pelvic pain.  Musculoskeletal: Negative for arthralgias, gait problem and joint swelling.  Skin: Negative for rash.  Neurological: Negative for dizziness, syncope, speech difficulty, weakness, numbness and headaches.  Hematological: Negative for adenopathy.  Psychiatric/Behavioral: Negative for behavioral problems, dysphoric mood and agitation. The patient is not nervous/anxious.        Objective:   Physical Exam    Constitutional: She is oriented to person, place, and time. She appears well-developed and well-nourished.  Repeat blood pressure 140/86  HENT:  Head: Normocephalic.  Right Ear: External ear normal.  Left Ear: External ear normal.  Mouth/Throat: Oropharynx is clear and moist.  Eyes: Conjunctivae and EOM are normal. Pupils are equal, round, and reactive to light.  Neck: Normal range of motion. Neck supple. No thyromegaly present.  Cardiovascular: Normal rate, regular rhythm, normal heart sounds and intact distal pulses.   Pulmonary/Chest: Effort normal and breath sounds normal.  Abdominal: Soft. Bowel sounds are normal. She exhibits no mass. There is no tenderness.  Musculoskeletal: Normal range of motion.  Lymphadenopathy:    She has no cervical adenopathy.  Neurological: She is alert and oriented to person, place, and time.  Skin: Skin is warm and dry. No rash noted.  Psychiatric: She has a normal mood and affect. Her behavior is normal.          Assessment & Plan:   UTI History of hypertension.  We'll continue to observe off medication.  Closer home blood pressure monitor and encouraged.  Low salt diet recommended.  History of anemia, stable Recheck 3 months

## 2013-12-18 NOTE — Telephone Encounter (Addendum)
Pt requesting rx ciprofloxacin (CIPRO) 500 MG tablet Be sent to [facility] [address]

## 2013-12-18 NOTE — Progress Notes (Signed)
Pre-visit discussion using our clinic review tool. No additional management support is needed unless otherwise documented below in the visit note.  

## 2013-12-18 NOTE — Patient Instructions (Signed)
Take your antibiotic as prescribed until ALL of it is gone, but stop if you develop a rash, swelling, or any side effects of the medication.  Contact our office as soon as possible if  there are side effects of the medication. 

## 2013-12-19 NOTE — Telephone Encounter (Signed)
Spoke to pt asked her if she got antibiotic. Pt stated yes her daughter had it switched. Told her okay, message was sent after I left yesterday and I wanted to make sure you got antibiotic. Pt said I got it.

## 2014-01-29 ENCOUNTER — Encounter: Payer: Self-pay | Admitting: Internal Medicine

## 2014-01-29 ENCOUNTER — Ambulatory Visit (INDEPENDENT_AMBULATORY_CARE_PROVIDER_SITE_OTHER): Payer: Medicare Other | Admitting: Internal Medicine

## 2014-01-29 VITALS — BP 146/100 | HR 69 | Temp 97.3°F | Resp 20 | Ht 66.0 in | Wt 143.0 lb

## 2014-01-29 DIAGNOSIS — E039 Hypothyroidism, unspecified: Secondary | ICD-10-CM | POA: Diagnosis not present

## 2014-01-29 DIAGNOSIS — I10 Essential (primary) hypertension: Secondary | ICD-10-CM

## 2014-01-29 DIAGNOSIS — E785 Hyperlipidemia, unspecified: Secondary | ICD-10-CM

## 2014-01-29 NOTE — Progress Notes (Signed)
Pre-visit discussion using our clinic review tool. No additional management support is needed unless otherwise documented below in the visit note.  

## 2014-01-29 NOTE — Patient Instructions (Addendum)
Limit your sodium (Salt) intake  Resume daily  Diovan

## 2014-01-30 ENCOUNTER — Encounter: Payer: Self-pay | Admitting: Internal Medicine

## 2014-01-30 NOTE — Progress Notes (Signed)
Subjective:    Patient ID: Dawn Woods, female    DOB: 06/19/1930, 80 y.o.   MRN: [id number]  HPI  80 year old patient who has a history of hypertension, dyslipidemia, and remote history of tobacco use.  For the past few days.  She has had some brief positional vertigo that have occurred in the mornings.  At the present time.  She is symptom-free She has a history of hypertension.  She was admitted to the hospital for GI bleeding.  Last year, associated with significant anemia and hypotension.  At the hypertensive therapy, discontinued at that time.  Blood pressure has been monitored off therapy since her hospital discharge.  Past Medical History  Diagnosis Date  . GERD (gastroesophageal reflux disease)   . Hyperlipidemia   . Hypertension   . Hypothyroidism   . H/O: hematuria 07/2010  . PONV (postoperative nausea and vomiting)     History   Social History  . Marital Status: Widowed    Spouse Name: N/A    Number of Children: 3  . Years of Education: N/A   Occupational History  . Not on file.   Social History Main Topics  . Smoking status: Former Smoker -- 0.50 packs/day for 63 years    Types: Cigarettes    Quit date: 08/05/2013  . Smokeless tobacco: Never Used  . Alcohol Use: No  . Drug Use: No  . Sexual Activity: Not on file   Other Topics Concern  . Not on file   Social History Narrative  . No narrative on file    Past Surgical History  Procedure Laterality Date  . Appendectomy      age 63  . Oophorectomy  1954  . Tonsillectomy      age 78  . Cholecystectomy      age 42  . Dilation and curettage of uterus      multiple's  . Fracture surgery  Dec 2009    left wrist  . Esophagogastroduodenoscopy N/A 08/06/2013    Procedure: ESOPHAGOGASTRODUODENOSCOPY (EGD);  Surgeon: Lear Ng, MD;  Location: [facility];  Service: Endoscopy;  Laterality: N/A;    Family History  Problem Relation Age of Onset  . Thyroid disease Sister 63    Allergies    Allergen Reactions  . Aspirin Other (See Comments)    GI Bleed  . Ibuprofen Other (See Comments)    GI Bleed  . Ciprofloxacin     REACTION: hives    Current Outpatient Prescriptions on File Prior to Visit  Medication Sig Dispense Refill  . cetirizine (ZYRTEC) 10 MG tablet Take 10 mg by mouth daily.      . ferrous sulfate 325 (65 FE) MG tablet Take 325 mg by mouth daily with breakfast.      . pantoprazole (PROTONIX) 40 MG tablet Take 1 tablet (40 mg total) by mouth 2 (two) times daily.  60 tablet  1  . simvastatin (ZOCOR) 10 MG tablet TAKE 1 TABLET AT BEDTIME  90 tablet  1  . SYNTHROID 75 MCG tablet TAKE 1 TABLET DAILY  90 tablet  1   No current facility-administered medications on file prior to visit.    BP 146/100  Pulse 69  Temp(Src) 97.3 F (36.3 C) (Oral)  Resp 20  Ht 5\' 6"  (1.676 m)  Wt 143 lb (64.864 kg)  BMI 23.09 kg/m2  SpO2 96%       Review of Systems  Constitutional: Negative.   HENT: Negative for congestion, dental problem, hearing  loss, rhinorrhea, sinus pressure, sore throat and tinnitus.   Eyes: Negative for pain, discharge and visual disturbance.  Respiratory: Negative for cough and shortness of breath.   Cardiovascular: Negative for chest pain, palpitations and leg swelling.  Gastrointestinal: Negative for nausea, vomiting, abdominal pain, diarrhea, constipation, blood in stool and abdominal distention.  Genitourinary: Negative for dysuria, urgency, frequency, hematuria, flank pain, vaginal bleeding, vaginal discharge, difficulty urinating, vaginal pain and pelvic pain.  Musculoskeletal: Negative for arthralgias, gait problem and joint swelling.  Skin: Negative for rash.  Neurological: Positive for dizziness and light-headedness. Negative for syncope, speech difficulty, weakness, numbness and headaches.  Hematological: Negative for adenopathy.  Psychiatric/Behavioral: Negative for behavioral problems, dysphoric mood and agitation. The patient is not  nervous/anxious.        Objective:   Physical Exam  Constitutional: She is oriented to person, place, and time. She appears well-developed and well-nourished. No distress.  Blood pressure 140/100  HENT:  Head: Normocephalic.  Right Ear: External ear normal.  Left Ear: External ear normal.  Mouth/Throat: Oropharynx is clear and moist.  Eyes: Conjunctivae and EOM are normal. Pupils are equal, round, and reactive to light.  Neck: Normal range of motion. Neck supple. No thyromegaly present.  Cardiovascular: Normal rate, regular rhythm, normal heart sounds and intact distal pulses.   Pulmonary/Chest: Effort normal and breath sounds normal.  Abdominal: Soft. Bowel sounds are normal. She exhibits no mass. There is no tenderness.  Musculoskeletal: Normal range of motion.  Lymphadenopathy:    She has no cervical adenopathy.  Neurological: She is alert and oriented to person, place, and time. She has normal reflexes. No cranial nerve deficit.  Pupillary response is normal Extraocular muscles full No facial asymmetry Normal finger to nose testing Normal gait  Skin: Skin is warm and dry. No rash noted.  Psychiatric: She has a normal mood and affect. Her behavior is normal.          Assessment & Plan:   Paroxysmal vertigo.  Episodes are brief and infrequent.  Will observe at this time Hypertension.  Suboptimal control.  We'll resume Diovan and recheck in 4 weeks

## 2014-02-12 ENCOUNTER — Encounter: Payer: Self-pay | Admitting: Internal Medicine

## 2014-02-12 ENCOUNTER — Ambulatory Visit (INDEPENDENT_AMBULATORY_CARE_PROVIDER_SITE_OTHER): Payer: Medicare Other | Admitting: Internal Medicine

## 2014-02-12 VITALS — BP 142/90 | HR 62 | Temp 97.6°F | Resp 20 | Ht 66.0 in | Wt 144.0 lb

## 2014-02-12 DIAGNOSIS — I10 Essential (primary) hypertension: Secondary | ICD-10-CM | POA: Diagnosis not present

## 2014-02-12 MED ORDER — HYDROCHLOROTHIAZIDE 12.5 MG PO TABS: 12.5000 mg | ORAL_TABLET | Freq: Every day | ORAL | Status: DC

## 2014-02-12 MED ORDER — SIMVASTATIN 10 MG PO TABS: ORAL_TABLET | ORAL | Status: DC

## 2014-02-12 MED ORDER — VALSARTAN-HYDROCHLOROTHIAZIDE 160-12.5 MG PO TABS: 1.0000 | ORAL_TABLET | Freq: Every day | ORAL | Status: DC

## 2014-02-12 MED ORDER — PANTOPRAZOLE SODIUM 40 MG PO TBEC: 40.0000 mg | DELAYED_RELEASE_TABLET | Freq: Two times a day (BID) | ORAL | Status: DC

## 2014-02-12 NOTE — Progress Notes (Signed)
Subjective:    Patient ID: Dawn Woods, female    DOB: 1929/12/06, 80 y.o.   MRN: [id number]  HPI BP Readings from Last 3 Encounters:  02/12/14 142/90  01/29/14 146/100  12/18/13 33/73   80 year old patient who has a history of prior hypertension.  This was discontinued last year when she was admitted hospital with GI bleeding and anemia.  She was seen recently with vertigo, which has not reoccurred.  Blood pressure was noted to be elevated at that time, and she is now on Diovan 160 mg daily.  She feels well  Past Medical History  Diagnosis Date  . GERD (gastroesophageal reflux disease)   . Hyperlipidemia   . Hypertension   . Hypothyroidism   . H/O: hematuria 07/2010  . PONV (postoperative nausea and vomiting)     History   Social History  . Marital Status: Widowed    Spouse Name: N/A    Number of Children: 3  . Years of Education: N/A   Occupational History  . Not on file.   Social History Main Topics  . Smoking status: Former Smoker -- 0.50 packs/day for 63 years    Types: Cigarettes    Quit date: 08/05/2013  . Smokeless tobacco: Never Used  . Alcohol Use: No  . Drug Use: No  . Sexual Activity: Not on file   Other Topics Concern  . Not on file   Social History Narrative  . No narrative on file    Past Surgical History  Procedure Laterality Date  . Appendectomy      age 88  . Oophorectomy  1954  . Tonsillectomy      age 80  . Cholecystectomy      age 66  . Dilation and curettage of uterus      multiple's  . Fracture surgery  Dec 2009    left wrist  . Esophagogastroduodenoscopy N/A 08/06/2013    Procedure: ESOPHAGOGASTRODUODENOSCOPY (EGD);  Surgeon: Lear Ng, MD;  Location: [facility];  Service: Endoscopy;  Laterality: N/A;    Family History  Problem Relation Age of Onset  . Thyroid disease Sister 27    Allergies  Allergen Reactions  . Aspirin Other (See Comments)    GI Bleed  . Ibuprofen Other (See Comments)    GI Bleed   . Ciprofloxacin     REACTION: hives    Current Outpatient Prescriptions on File Prior to Visit  Medication Sig Dispense Refill  . cetirizine (ZYRTEC) 10 MG tablet Take 10 mg by mouth daily.      . ferrous sulfate 325 (65 FE) MG tablet Take 325 mg by mouth daily with breakfast.      . SYNTHROID 75 MCG tablet TAKE 1 TABLET DAILY  90 tablet  1   No current facility-administered medications on file prior to visit.    BP 142/90  Pulse 62  Temp(Src) 97.6 F (36.4 C) (Oral)  Resp 20  Ht 5\' 6"  (1.676 m)  Wt 144 lb (65.318 kg)  BMI 23.25 kg/m2  SpO2 96%    Review of Systems  Constitutional: Negative.   HENT: Negative for congestion, dental problem, hearing loss, rhinorrhea, sinus pressure, sore throat and tinnitus.   Eyes: Negative for pain, discharge and visual disturbance.  Respiratory: Negative for cough and shortness of breath.   Cardiovascular: Negative for chest pain, palpitations and leg swelling.  Gastrointestinal: Negative for nausea, vomiting, abdominal pain, diarrhea, constipation, blood in stool and abdominal distention.  Genitourinary: Negative for dysuria,  urgency, frequency, hematuria, flank pain, vaginal bleeding, vaginal discharge, difficulty urinating, vaginal pain and pelvic pain.  Musculoskeletal: Negative for arthralgias, gait problem and joint swelling.  Skin: Negative for rash.  Neurological: Negative for dizziness, syncope, speech difficulty, weakness, numbness and headaches.  Hematological: Negative for adenopathy.  Psychiatric/Behavioral: Negative for behavioral problems, dysphoric mood and agitation. The patient is not nervous/anxious.        Objective:   Physical Exam  Constitutional: She appears well-developed and well-nourished. No distress.  Blood pressure 160/90          Assessment & Plan:   Hypertension.  Suboptimal control.  We'll switch to Diovan 160 with 12 point 5 mg, hydrochlorothiazide  Recheck 6 weeks

## 2014-02-12 NOTE — Progress Notes (Signed)
Pre-visit discussion using our clinic review tool. No additional management support is needed unless otherwise documented below in the visit note.  

## 2014-02-12 NOTE — Patient Instructions (Signed)
Diovan 160 with 12.5 mg, hydrochlorothiazide , daily for blood pressure control  Limit your sodium (Salt) intake  Please check your blood pressure on a regular basis.  If it is consistently greater than 150/90, please make an office appointment.

## 2014-02-13 ENCOUNTER — Telehealth: Payer: Self-pay | Admitting: Internal Medicine

## 2014-02-13 ENCOUNTER — Other Ambulatory Visit: Payer: Self-pay | Admitting: Internal Medicine

## 2014-02-13 NOTE — Telephone Encounter (Signed)
Spoke with daughter

## 2014-02-13 NOTE — Telephone Encounter (Signed)
Daughter would like to discuss pt's med list and what pt was prescribed yesterday.  She states pt is confused about her meds and should not be coming top her appts alone. pls call .

## 2014-02-14 NOTE — Telephone Encounter (Signed)
Simvastatin  refill okay Why does she need refill of Cipro??

## 2014-04-03 ENCOUNTER — Ambulatory Visit (INDEPENDENT_AMBULATORY_CARE_PROVIDER_SITE_OTHER): Payer: Medicare Other | Admitting: Internal Medicine

## 2014-04-03 ENCOUNTER — Encounter: Payer: Self-pay | Admitting: Internal Medicine

## 2014-04-03 VITALS — BP 130/80 | HR 60 | Temp 97.7°F | Resp 18 | Ht 66.0 in | Wt 147.0 lb

## 2014-04-03 DIAGNOSIS — F17200 Nicotine dependence, unspecified, uncomplicated: Secondary | ICD-10-CM

## 2014-04-03 DIAGNOSIS — I10 Essential (primary) hypertension: Secondary | ICD-10-CM

## 2014-04-03 DIAGNOSIS — Z720 Tobacco use: Secondary | ICD-10-CM

## 2014-04-03 DIAGNOSIS — E785 Hyperlipidemia, unspecified: Secondary | ICD-10-CM

## 2014-04-03 NOTE — Progress Notes (Signed)
Pre visit review using our clinic review tool, if applicable. No additional management support is needed unless otherwise documented below in the visit note. 

## 2014-04-03 NOTE — Patient Instructions (Signed)
Limit your sodium (Salt) intake  Please check your blood pressure on a regular basis.  If it is consistently greater than 150/90, please make an office appointment.  Return in 4 months for follow-up  

## 2014-04-03 NOTE — Progress Notes (Signed)
Subjective:    Patient ID: Dawn Woods, female    DOB: 04-06-1930, 80 y.o.   MRN: [id number]  HPI  80 year old patient who is seen today for followup of hypertension.  She has a prior history of hypertension and antihypertensive medications were discontinued.  Last fall when she was admitted to the hospital with melena anemia.  2 months ago hydrochlorothiazide was added to her regimen due to persistently elevated readings.  Due to marginal fluid intake.  Her daughter elected not to start this medication and blood pressure has been observed on Diovan  Alone. She generally feels well.  Blood pressure today 1:30 over 80  Past Medical History  Diagnosis Date  . GERD (gastroesophageal reflux disease)   . Hyperlipidemia   . Hypertension   . Hypothyroidism   . H/O: hematuria 07/2010  . PONV (postoperative nausea and vomiting)     History   Social History  . Marital Status: Widowed    Spouse Name: N/A    Number of Children: 3  . Years of Education: N/A   Occupational History  . Not on file.   Social History Main Topics  . Smoking status: Former Smoker -- 0.50 packs/day for 63 years    Types: Cigarettes    Quit date: 08/05/2013  . Smokeless tobacco: Never Used  . Alcohol Use: No  . Drug Use: No  . Sexual Activity: Not on file   Other Topics Concern  . Not on file   Social History Narrative  . No narrative on file    Past Surgical History  Procedure Laterality Date  . Appendectomy      age 38  . Oophorectomy  1954  . Tonsillectomy      age 67  . Cholecystectomy      age 9  . Dilation and curettage of uterus      multiple's  . Fracture surgery  Dec 2009    left wrist  . Esophagogastroduodenoscopy N/A 08/06/2013    Procedure: ESOPHAGOGASTRODUODENOSCOPY (EGD);  Surgeon: Lear Ng, MD;  Location: [facility];  Service: Endoscopy;  Laterality: N/A;    Family History  Problem Relation Age of Onset  . Thyroid disease Sister 90    Allergies    Allergen Reactions  . Aspirin Other (See Comments)    GI Bleed  . Ibuprofen Other (See Comments)    GI Bleed  . Ciprofloxacin     REACTION: hives    Current Outpatient Prescriptions on File Prior to Visit  Medication Sig Dispense Refill  . cetirizine (ZYRTEC) 10 MG tablet Take 10 mg by mouth daily.      . ferrous sulfate 325 (65 FE) MG tablet Take 325 mg by mouth daily with breakfast.      . pantoprazole (PROTONIX) 40 MG tablet Take 1 tablet (40 mg total) by mouth 2 (two) times daily.  180 tablet  3  . simvastatin (ZOCOR) 10 MG tablet TAKE 1 TABLET AT BEDTIME  90 tablet  3  . simvastatin (ZOCOR) 10 MG tablet TAKE 1 TABLET AT BEDTIME  90 tablet  1  . SYNTHROID 75 MCG tablet TAKE 1 TABLET DAILY  90 tablet  1  . valsartan (DIOVAN) 160 MG tablet Take 160 mg by mouth daily.      . hydrochlorothiazide (HYDRODIURIL) 12.5 MG tablet Take 1 tablet (12.5 mg total) by mouth daily.  90 tablet  3   No current facility-administered medications on file prior to visit.    BP 130/80  Pulse 60  Temp(Src) 97.7 F (36.5 C) (Oral)  Resp 18  Ht 5\' 6"  (1.676 m)  Wt 147 lb (66.679 kg)  BMI 23.74 kg/m2  SpO2 95%       Review of Systems  Constitutional: Negative.   HENT: Negative for congestion, dental problem, hearing loss, rhinorrhea, sinus pressure, sore throat and tinnitus.   Eyes: Negative for pain, discharge and visual disturbance.  Respiratory: Negative for cough and shortness of breath.   Cardiovascular: Negative for chest pain, palpitations and leg swelling.  Gastrointestinal: Negative for nausea, vomiting, abdominal pain, diarrhea, constipation, blood in stool and abdominal distention.  Genitourinary: Negative for dysuria, urgency, frequency, hematuria, flank pain, vaginal bleeding, vaginal discharge, difficulty urinating, vaginal pain and pelvic pain.  Musculoskeletal: Negative for arthralgias, gait problem and joint swelling.  Skin: Negative for rash.  Neurological: Negative for  dizziness, syncope, speech difficulty, weakness, numbness and headaches.  Hematological: Negative for adenopathy.  Psychiatric/Behavioral: Negative for behavioral problems, dysphoric mood and agitation. The patient is not nervous/anxious.        Objective:   Physical Exam  Constitutional: She is oriented to person, place, and time. She appears well-developed and well-nourished.  Repeat blood pressure 130/80  HENT:  Head: Normocephalic.  Right Ear: External ear normal.  Left Ear: External ear normal.  Mouth/Throat: Oropharynx is clear and moist.  Eyes: Conjunctivae and EOM are normal. Pupils are equal, round, and reactive to light.  Neck: Normal range of motion. Neck supple. No thyromegaly present.  Cardiovascular: Normal rate, regular rhythm, normal heart sounds and intact distal pulses.   Pulmonary/Chest: Effort normal and breath sounds normal.  Abdominal: Soft. Bowel sounds are normal. She exhibits no mass. There is no tenderness.  Musculoskeletal: Normal range of motion.  Lymphadenopathy:    She has no cervical adenopathy.  Neurological: She is alert and oriented to person, place, and time.  Skin: Skin is warm and dry. No rash noted.  Psychiatric: She has a normal mood and affect. Her behavior is normal.          Assessment & Plan:   Hypertension.  Well controlled on single therapy.  We'll discontinue hydrochlorothiazide and continue to observe the office medication. Recheck 4 months Total smoking cessation encouraged

## 2014-04-04 ENCOUNTER — Telehealth: Payer: Self-pay | Admitting: Internal Medicine

## 2014-04-04 NOTE — Telephone Encounter (Signed)
Relevant patient education assigned to patient using Emmi. ° °

## 2014-04-29 ENCOUNTER — Other Ambulatory Visit: Payer: Self-pay | Admitting: Internal Medicine

## 2014-04-29 ENCOUNTER — Telehealth: Payer: Self-pay | Admitting: Internal Medicine

## 2014-04-29 MED ORDER — VALSARTAN 160 MG PO TABS
160.0000 mg | ORAL_TABLET | Freq: Every day | ORAL | Status: DC
Start: 2014-04-29 — End: 2014-09-21

## 2014-04-29 NOTE — Telephone Encounter (Signed)
Pt request 90 day valsartan (DIOVAN) 160 MG tablet [facility] Pt states she wants the plain bp tab. Pt doesn't need the diuretic and this was discussed last 2 appts

## 2014-04-29 NOTE — Telephone Encounter (Signed)
Pt notified Rx sent to pharmacy as requested. 

## 2014-05-31 ENCOUNTER — Encounter: Payer: Self-pay | Admitting: Internal Medicine

## 2014-06-05 ENCOUNTER — Encounter: Payer: Self-pay | Admitting: Physician Assistant

## 2014-06-05 ENCOUNTER — Ambulatory Visit (INDEPENDENT_AMBULATORY_CARE_PROVIDER_SITE_OTHER): Payer: Medicare Other | Admitting: Physician Assistant

## 2014-06-05 VITALS — BP 120/72 | HR 66 | Temp 97.9°F | Resp 18 | Wt 148.0 lb

## 2014-06-05 DIAGNOSIS — H6120 Impacted cerumen, unspecified ear: Secondary | ICD-10-CM

## 2014-06-05 DIAGNOSIS — H8110 Benign paroxysmal vertigo, unspecified ear: Secondary | ICD-10-CM

## 2014-06-05 DIAGNOSIS — H6122 Impacted cerumen, left ear: Secondary | ICD-10-CM

## 2014-06-05 NOTE — Progress Notes (Signed)
Subjective:    Patient ID: Dawn Woods, female    DOB: Mar 29, 1930, 80 y.o.   MRN: [id number]  Dizziness This is a new (has had this multiple times in the past) problem. The current episode started in the past 7 days (3 days). The problem occurs daily (worse when lying down). The problem has been waxing and waning. Associated symptoms include congestion, headaches (on and off, goes away on its own.) and vertigo. Pertinent negatives include no abdominal pain, anorexia, arthralgias, change in bowel habit, chest pain, chills, coughing, diaphoresis, fatigue, fever, joint swelling, myalgias, nausea, neck pain, numbness, rash, sore throat, swollen glands, urinary symptoms, visual change, vomiting or weakness. Treatments tried: zyrtec.      Review of Systems  Constitutional: Negative for fever, chills, diaphoresis and fatigue.  HENT: Positive for congestion. Negative for ear discharge, ear pain and sore throat.   Respiratory: Negative for cough and shortness of breath.   Cardiovascular: Negative for chest pain.  Gastrointestinal: Negative for nausea, vomiting, abdominal pain, diarrhea, anorexia and change in bowel habit.  Musculoskeletal: Negative for arthralgias, joint swelling, myalgias and neck pain.  Skin: Negative for rash.  Neurological: Positive for dizziness, vertigo and headaches (on and off, goes away on its own.). Negative for syncope, weakness, light-headedness and numbness.  All other systems reviewed and are negative.    Past Medical History  Diagnosis Date  . GERD (gastroesophageal reflux disease)   . Hyperlipidemia   . Hypertension   . Hypothyroidism   . H/O: hematuria 07/2010  . PONV (postoperative nausea and vomiting)     History   Social History  . Marital Status: Widowed    Spouse Name: N/A    Number of Children: 3  . Years of Education: N/A   Occupational History  . Not on file.   Social History Main Topics  . Smoking status: Former Smoker -- 0.50  packs/day for 63 years    Types: Cigarettes    Quit date: 08/05/2013  . Smokeless tobacco: Never Used  . Alcohol Use: No  . Drug Use: No  . Sexual Activity: Not on file   Other Topics Concern  . Not on file   Social History Narrative  . No narrative on file    Past Surgical History  Procedure Laterality Date  . Appendectomy      age 27  . Oophorectomy  1954  . Tonsillectomy      age 58  . Cholecystectomy      age 57  . Dilation and curettage of uterus      multiple's  . Fracture surgery  Dec 2009    left wrist  . Esophagogastroduodenoscopy N/A 08/06/2013    Procedure: ESOPHAGOGASTRODUODENOSCOPY (EGD);  Surgeon: Lear Ng, MD;  Location: [facility];  Service: Endoscopy;  Laterality: N/A;    Family History  Problem Relation Age of Onset  . Thyroid disease Sister 83    Allergies  Allergen Reactions  . Aspirin Other (See Comments)    GI Bleed  . Ibuprofen Other (See Comments)    GI Bleed  . Ciprofloxacin     REACTION: hives    Current Outpatient Prescriptions on File Prior to Visit  Medication Sig Dispense Refill  . cetirizine (ZYRTEC) 10 MG tablet Take 10 mg by mouth daily.      . ferrous sulfate 325 (65 FE) MG tablet Take 325 mg by mouth daily with breakfast.      . hydrochlorothiazide (HYDRODIURIL) 12.5 MG tablet Take 1 tablet (  12.5 mg total) by mouth daily.  90 tablet  3  . pantoprazole (PROTONIX) 40 MG tablet Take 1 tablet (40 mg total) by mouth 2 (two) times daily.  180 tablet  3  . simvastatin (ZOCOR) 10 MG tablet TAKE 1 TABLET AT BEDTIME  90 tablet  3  . simvastatin (ZOCOR) 10 MG tablet TAKE 1 TABLET AT BEDTIME  90 tablet  1  . SYNTHROID 75 MCG tablet TAKE 1 TABLET DAILY  90 tablet  1  . valsartan (DIOVAN) 160 MG tablet Take 1 tablet (160 mg total) by mouth daily.  90 tablet  1   No current facility-administered medications on file prior to visit.    EXAM: BP 120/72  Pulse 66  Temp(Src) 97.9 F (36.6 C) (Oral)  Resp 18  Wt 148 lb  (67.132 kg)     Objective:   Physical Exam  Nursing note and vitals reviewed. Constitutional: She is oriented to person, place, and time. She appears well-developed and well-nourished. No distress.  HENT:  Head: Normocephalic and atraumatic.  Right Ear: External ear normal.  Nose: Nose normal.  Mouth/Throat: Oropharynx is clear and moist. No oropharyngeal exudate.  Right ear canal normal, left ear canal with excess cerumen, attempted lavage in office. TM visible. Bilateral TMs normal.  Eyes: Conjunctivae and EOM are normal. Pupils are equal, round, and reactive to light.  Neck: Normal range of motion. Neck supple.  Cardiovascular: Normal rate, regular rhythm and intact distal pulses.   Pulmonary/Chest: Effort normal and breath sounds normal. No stridor. No respiratory distress. She has no wheezes. She has no rales. She exhibits no tenderness.  Lymphadenopathy:    She has no cervical adenopathy.  Neurological: She is alert and oriented to person, place, and time.  Vertigo not elicitable.  Skin: Skin is warm and dry. She is not diaphoretic. No pallor.  Psychiatric: She has a normal mood and affect. Her behavior is normal. Judgment and thought content normal.     Lab Results  Component Value Date   WBC 8.8 09/18/2013   HGB 12.6 09/18/2013   HCT 39.8 09/18/2013   PLT 285.0 09/18/2013   GLUCOSE 122* 08/21/2013   CHOL 120 08/06/2013   TRIG 49 08/06/2013   HDL 49 08/06/2013   LDLCALC 61 08/06/2013   ALT 14 08/08/2013   AST 21 08/08/2013   NA 138 08/21/2013   K 3.9 08/21/2013   CL 105 08/21/2013   CREATININE 0.8 08/21/2013   BUN 15 08/21/2013   CO2 27 08/21/2013   TSH 2.38 09/18/2013   HGBA1C 5.9* 08/05/2013        Assessment & Plan:  Dawn Woods was seen today for dizziness.  Diagnoses and associated orders for this visit:  BPPV (benign paroxysmal positional vertigo), unspecified laterality Comments: With ETD. Continue daily Zyrtec, add nasal steroid. trial of  supervised eply maneuvers. Watchful waiting.  Excessive cerumen in ear canal, left Comments: soft, TM visible behind. Attempted lavage, but pt prefered to try at home Debrox instead.    Pt also given handouts on Home Eply maneuver to try, with supervision of her daughter in order to prevent her from falling. Pt states that she probably wont do the maneuver, but accepted instructions anyway.  Return precautions provided, and patient handout on BPPV.  Plan to follow up as needed, or for worsening or persistent symptoms despite treatment.  Patient Instructions  Continue taking Zyrtec daily.  Force NON dairy fluids, drinking plenty of water is best.    Over the  Counter Nasacort AQ 1 spray in each nostril twice a day as needed. Use the "crossover" technique into opposite nostril spraying toward opposite ear @ 45 degree angle, not straight up into nostril.   You can also try over the counter Debrox ear wax softening drops to help clear earwax.  Try the home Eply maneuvers to help your vertigo symptoms, but make sure you have help/supervision when doing them to prevent falling.  If emergency symptoms discussed during visit developed, seek medical attention immediately.  Followup as needed, or for worsening or persistent symptoms despite treatment.

## 2014-06-05 NOTE — Patient Instructions (Addendum)
Continue taking Zyrtec daily.  Force NON dairy fluids, drinking plenty of water is best.    Over the Counter Nasacort AQ 1 spray in each nostril twice a day as needed. Use the "crossover" technique into opposite nostril spraying toward opposite ear @ 45 degree angle, not straight up into nostril.   You can also try over the counter Debrox ear wax softening drops to help clear earwax.  Try the home Eply maneuvers to help your vertigo symptoms, but make sure you have help/supervision when doing them to prevent falling.  If emergency symptoms discussed during visit developed, seek medical attention immediately.  Followup as needed, or for worsening or persistent symptoms despite treatment.     Benign Positional Vertigo Vertigo means you feel like you or your surroundings are moving when they are not. Benign positional vertigo is the most common form of vertigo. Benign means that the cause of your condition is not serious. Benign positional vertigo is more common in older adults. CAUSES  Benign positional vertigo is the result of an upset in the labyrinth system. This is an area in the middle ear that helps control your balance. This may be caused by a viral infection, head injury, or repetitive motion. However, often no specific cause is found. SYMPTOMS  Symptoms of benign positional vertigo occur when you move your head or eyes in different directions. Some of the symptoms may include:  Loss of balance and falls.  Vomiting.  Blurred vision.  Dizziness.  Nausea.  Involuntary eye movements (nystagmus). DIAGNOSIS  Benign positional vertigo is usually diagnosed by physical exam. If the specific cause of your benign positional vertigo is unknown, your caregiver may perform imaging tests, such as magnetic resonance imaging (MRI) or computed tomography (CT). TREATMENT  Your caregiver may recommend movements or procedures to correct the benign positional vertigo. Medicines such as  meclizine, benzodiazepines, and medicines for nausea may be used to treat your symptoms. In rare cases, if your symptoms are caused by certain conditions that affect the inner ear, you may need surgery. HOME CARE INSTRUCTIONS   Follow your caregiver's instructions.  Move slowly. Do not make sudden body or head movements.  Avoid driving.  Avoid operating heavy machinery.  Avoid performing any tasks that would be dangerous to you or others during a vertigo episode.  Drink enough fluids to keep your urine clear or pale yellow. SEEK IMMEDIATE MEDICAL CARE IF:   You develop problems with walking, weakness, numbness, or using your arms, hands, or legs.  You have difficulty speaking.  You develop severe headaches.  Your nausea or vomiting continues or gets worse.  You develop visual changes.  Your family or friends notice any behavioral changes.  Your condition gets worse.  You have a fever.  You develop a stiff neck or sensitivity to light. MAKE SURE YOU:   Understand these instructions.  Will watch your condition.  Will get help right away if you are not doing well or get worse. Document Released: 06/20/2006 Document Revised: 12/05/2011 Document Reviewed: 06/02/2011 [facility] Patient Information 2015 [place], [place]. This information is not intended to replace advice given to you by your health care provider. Make sure you discuss any questions you have with your health care provider.

## 2014-08-07 DIAGNOSIS — H9193 Unspecified hearing loss, bilateral: Secondary | ICD-10-CM | POA: Diagnosis not present

## 2014-08-07 DIAGNOSIS — J309 Allergic rhinitis, unspecified: Secondary | ICD-10-CM | POA: Diagnosis not present

## 2014-08-07 DIAGNOSIS — H6123 Impacted cerumen, bilateral: Secondary | ICD-10-CM | POA: Diagnosis not present

## 2014-08-25 ENCOUNTER — Encounter: Payer: Self-pay | Admitting: Family Medicine

## 2014-08-25 ENCOUNTER — Ambulatory Visit (INDEPENDENT_AMBULATORY_CARE_PROVIDER_SITE_OTHER): Payer: Medicare Other | Admitting: Family Medicine

## 2014-08-25 VITALS — BP 113/80 | HR 82 | Temp 97.3°F | Ht 66.0 in | Wt 148.0 lb

## 2014-08-25 DIAGNOSIS — H8110 Benign paroxysmal vertigo, unspecified ear: Secondary | ICD-10-CM | POA: Insufficient documentation

## 2014-08-25 MED ORDER — MECLIZINE HCL 25 MG PO TABS: 25.0000 mg | ORAL_TABLET | ORAL | Status: DC | PRN

## 2014-08-25 NOTE — Progress Notes (Signed)
Pre visit review using our clinic review tool, if applicable. No additional management support is needed unless otherwise documented below in the visit note. 

## 2014-08-25 NOTE — Progress Notes (Signed)
   Subjective:    Patient ID: Dawn Woods, female    DOB: [birth year], 80 y.o.   MRN: [id number]  HPI Here with her daughter for chronic dizziness which has been identified as benign positional vertigo. She describes classic sx of the room spinning with certain position changes, especially when lying in bed at night. She takes antihistamines and Flonase sprays. She has frequent wax build ups in the ears and she had both canals cleaned out at the ENT office 2 weeks ago. She still puts warm water in her ears daily however.    Review of Systems  Constitutional: Negative.   HENT: Negative.   Respiratory: Negative.   Cardiovascular: Negative.   Neurological: Positive for dizziness. Negative for tremors, seizures, syncope, facial asymmetry, speech difficulty, weakness, light-headedness, numbness and headaches.       Objective:   Physical Exam  Constitutional: She is oriented to person, place, and time. She appears well-developed and well-nourished. No distress.  HENT:  Head: Normocephalic and atraumatic.  Right Ear: External ear normal.  Left Ear: External ear normal.  Nose: Nose normal.  Mouth/Throat: Oropharynx is clear and moist.  Eyes: Conjunctivae are normal. Pupils are equal, round, and reactive to light.  Cardiovascular: Normal rate, regular rhythm, normal heart sounds and intact distal pulses.   Pulmonary/Chest: Effort normal and breath sounds normal.  Neurological: She is alert and oriented to person, place, and time. No cranial nerve deficit. Coordination normal.          Assessment & Plan:  This is classic BPV. I advised her to avoid putting anything in her ears. Try Meclizine 25 mg prn. I offered to refer her for vestibular rehab but she declined. Recheck prn

## 2014-09-21 ENCOUNTER — Other Ambulatory Visit: Payer: Self-pay | Admitting: Internal Medicine

## 2014-09-30 ENCOUNTER — Other Ambulatory Visit: Payer: Self-pay | Admitting: Internal Medicine

## 2015-02-02 ENCOUNTER — Telehealth: Payer: Self-pay | Admitting: Internal Medicine

## 2015-02-02 MED ORDER — LEVOTHYROXINE SODIUM 75 MCG PO TABS: 75.0000 ug | ORAL_TABLET | Freq: Every day | ORAL | Status: DC

## 2015-02-02 MED ORDER — SIMVASTATIN 10 MG PO TABS: 10.0000 mg | ORAL_TABLET | Freq: Every day | ORAL | Status: DC

## 2015-02-02 NOTE — Telephone Encounter (Signed)
Pt request refill of the following: simvastatin (ZOCOR) 10 MG tablet  SYNTHROID 75 MCG tablet   90 day supply    Phamacy: [facility]

## 2015-02-02 NOTE — Telephone Encounter (Signed)
Pt's daughter Hilda Blades notified Rx's sent to pharmacy and that pt needs to schedule a physical. Hilda Blades verbalized understanding.

## 2015-02-03 ENCOUNTER — Telehealth: Payer: Self-pay | Admitting: Internal Medicine

## 2015-02-03 MED ORDER — PANTOPRAZOLE SODIUM 40 MG PO TBEC: 40.0000 mg | DELAYED_RELEASE_TABLET | Freq: Two times a day (BID) | ORAL | Status: DC

## 2015-02-03 NOTE — Telephone Encounter (Signed)
Pt request refill pantoprazole (PROTONIX) 40 MG tablet Daughter forgot to tell you this yesterday. [facility] mail

## 2015-02-03 NOTE — Telephone Encounter (Signed)
Rx sent to [facility]. 

## 2015-05-01 ENCOUNTER — Encounter: Payer: Self-pay | Admitting: Internal Medicine

## 2015-05-01 ENCOUNTER — Ambulatory Visit (INDEPENDENT_AMBULATORY_CARE_PROVIDER_SITE_OTHER): Payer: Medicare Other | Admitting: Internal Medicine

## 2015-05-01 VITALS — BP 120/90 | HR 78 | Wt 152.0 lb

## 2015-05-01 DIAGNOSIS — I10 Essential (primary) hypertension: Secondary | ICD-10-CM | POA: Diagnosis not present

## 2015-05-01 DIAGNOSIS — E785 Hyperlipidemia, unspecified: Secondary | ICD-10-CM

## 2015-05-01 DIAGNOSIS — Z720 Tobacco use: Secondary | ICD-10-CM

## 2015-05-01 DIAGNOSIS — E039 Hypothyroidism, unspecified: Secondary | ICD-10-CM | POA: Diagnosis not present

## 2015-05-01 LAB — CBC WITH DIFFERENTIAL/PLATELET
BASOS PCT: 0.6 % (ref 0.0–3.0)
Basophils Absolute: 0 10*3/uL (ref 0.0–0.1)
Eosinophils Absolute: 0.2 10*3/uL (ref 0.0–0.7)
Eosinophils Relative: 2.4 % (ref 0.0–5.0)
HCT: 42.7 % (ref 36.0–46.0)
Hemoglobin: 14.3 g/dL (ref 12.0–15.0)
Lymphocytes Relative: 14.9 % (ref 12.0–46.0)
Lymphs Abs: 1.2 10*3/uL (ref 0.7–4.0)
MCHC: 33.4 g/dL (ref 30.0–36.0)
MCV: 89.6 fl (ref 78.0–100.0)
MONO ABS: 0.5 10*3/uL (ref 0.1–1.0)
Monocytes Relative: 6.3 % (ref 3.0–12.0)
NEUTROS PCT: 75.8 % (ref 43.0–77.0)
Neutro Abs: 6.1 10*3/uL (ref 1.4–7.7)
Platelets: 257 10*3/uL (ref 150.0–400.0)
RBC: 4.77 Mil/uL (ref 3.87–5.11)
RDW: 14.4 % (ref 11.5–15.5)
WBC: 8 10*3/uL (ref 4.0–10.5)

## 2015-05-01 LAB — COMPREHENSIVE METABOLIC PANEL
ALK PHOS: 67 U/L (ref 39–117)
ALT: 16 U/L (ref 0–35)
AST: 21 U/L (ref 0–37)
Albumin: 3.9 g/dL (ref 3.5–5.2)
BILIRUBIN TOTAL: 0.7 mg/dL (ref 0.2–1.2)
BUN: 16 mg/dL (ref 6–23)
CALCIUM: 9.6 mg/dL (ref 8.4–10.5)
CO2: 30 mEq/L (ref 19–32)
CREATININE: 0.78 mg/dL (ref 0.40–1.20)
Chloride: 105 mEq/L (ref 96–112)
GFR: 74.92 mL/min (ref >60.00)
Glucose, Bld: 97 mg/dL (ref 70–99)
Potassium: 4.8 mEq/L (ref 3.5–5.1)
SODIUM: 142 meq/L (ref 135–145)
TOTAL PROTEIN: 6.7 g/dL (ref 6.0–8.3)

## 2015-05-01 LAB — LIPID PANEL
CHOLESTEROL: 157 mg/dL (ref 0–200)
HDL: 58.8 mg/dL (ref >39.00)
LDL Cholesterol: 79 mg/dL (ref 0–99)
NONHDL: 98.17
Total CHOL/HDL Ratio: 3
Triglycerides: 96 mg/dL (ref 0.0–149.0)
VLDL: 19.2 mg/dL (ref 0.0–40.0)

## 2015-05-01 LAB — TSH: TSH: 6.01 u[IU]/mL — AB (ref 0.35–4.50)

## 2015-05-01 MED ORDER — VALSARTAN 160 MG PO TABS: 160.0000 mg | ORAL_TABLET | Freq: Every day | ORAL | Status: DC

## 2015-05-01 MED ORDER — LEVOTHYROXINE SODIUM 75 MCG PO TABS: 75.0000 ug | ORAL_TABLET | Freq: Every day | ORAL | Status: DC

## 2015-05-01 MED ORDER — PANTOPRAZOLE SODIUM 40 MG PO TBEC: 40.0000 mg | DELAYED_RELEASE_TABLET | Freq: Two times a day (BID) | ORAL | Status: DC

## 2015-05-01 MED ORDER — SIMVASTATIN 10 MG PO TABS: 10.0000 mg | ORAL_TABLET | Freq: Every day | ORAL | Status: DC

## 2015-05-01 NOTE — Progress Notes (Signed)
Pre visit review using our clinic review tool, if applicable. No additional management support is needed unless otherwise documented below in the visit note. 

## 2015-05-01 NOTE — Progress Notes (Signed)
Subjective:    Patient ID: Dawn Woods, female    DOB: Aug 28, 1930, 81 y.o.   MRN: [id number]  HPI 81 year old patient who has hypothyroidism, dyslipidemia and hypertension.  She has not been seen for follow-up in about one year.  She has been seen for acute vertigo which has not been a recurrent problem. No concerns or complaints.  Denies any cardiopulmonary complaints. Does have a history of ongoing tobacco use. She has been compliant with her medications.  Past Medical History  Diagnosis Date  . GERD (gastroesophageal reflux disease)   . Hyperlipidemia   . Hypertension   . Hypothyroidism   . H/O: hematuria 07/2010  . PONV (postoperative nausea and vomiting)     History   Social History  . Marital Status: Widowed    Spouse Name: N/A  . Number of Children: 3  . Years of Education: N/A   Occupational History  . Not on file.   Social History Main Topics  . Smoking status: Former Smoker -- 0.50 packs/day for 63 years    Types: Cigarettes    Quit date: 08/05/2013  . Smokeless tobacco: Never Used  . Alcohol Use: No  . Drug Use: No  . Sexual Activity: Not on file   Other Topics Concern  . Not on file   Social History Narrative    Past Surgical History  Procedure Laterality Date  . Appendectomy      age 25  . Oophorectomy  1954  . Tonsillectomy      age 48  . Cholecystectomy      age 30  . Dilation and curettage of uterus      multiple's  . Fracture surgery  Dec 2009    left wrist  . Esophagogastroduodenoscopy N/A 08/06/2013    Procedure: ESOPHAGOGASTRODUODENOSCOPY (EGD);  Surgeon: Lear Ng, MD;  Location: [facility];  Service: Endoscopy;  Laterality: N/A;    Family History  Problem Relation Age of Onset  . Thyroid disease Sister 42    Allergies  Allergen Reactions  . Aspirin Other (See Comments)    GI Bleed  . Ibuprofen Other (See Comments)    GI Bleed  . Ciprofloxacin     REACTION: hives    Current Outpatient Prescriptions  on File Prior to Visit  Medication Sig Dispense Refill  . cetirizine (ZYRTEC) 10 MG tablet Take 10 mg by mouth daily.    . ferrous sulfate 325 (65 FE) MG tablet Take 325 mg by mouth daily with breakfast.    . hydrochlorothiazide (HYDRODIURIL) 12.5 MG tablet Take 1 tablet (12.5 mg total) by mouth daily. 90 tablet 3  . meclizine (ANTIVERT) 25 MG tablet Take 1 tablet (25 mg total) by mouth every 4 (four) hours as needed for dizziness. 60 tablet 2  . mometasone (NASONEX) 50 MCG/ACT nasal spray Place 2 sprays into the nose daily.     No current facility-administered medications on file prior to visit.    BP 120/90 mmHg  Pulse 78  Wt 152 lb (68.947 kg)  SpO2 94%      Review of Systems  Constitutional: Negative.   HENT: Negative for congestion, dental problem, hearing loss, rhinorrhea, sinus pressure, sore throat and tinnitus.   Eyes: Negative for pain, discharge and visual disturbance.  Respiratory: Negative for cough and shortness of breath.   Cardiovascular: Negative for chest pain, palpitations and leg swelling.  Gastrointestinal: Negative for nausea, vomiting, abdominal pain, diarrhea, constipation, blood in stool and abdominal distention.  Genitourinary: Negative for  dysuria, urgency, frequency, hematuria, flank pain, vaginal bleeding, vaginal discharge, difficulty urinating, vaginal pain and pelvic pain.  Musculoskeletal: Negative for joint swelling, arthralgias and gait problem.  Skin: Negative for rash.  Neurological: Negative for dizziness, syncope, speech difficulty, weakness, numbness and headaches.  Hematological: Negative for adenopathy.  Psychiatric/Behavioral: Negative for behavioral problems, dysphoric mood and agitation. The patient is not nervous/anxious.        Objective:   Physical Exam  Constitutional: She is oriented to person, place, and time. She appears well-developed and well-nourished.  HENT:  Head: Normocephalic.  Right Ear: External ear normal.  Left  Ear: External ear normal.  Mouth/Throat: Oropharynx is clear and moist.  Eyes: Conjunctivae and EOM are normal. Pupils are equal, round, and reactive to light.  Neck: Normal range of motion. Neck supple. No thyromegaly present.  Cardiovascular: Normal rate, regular rhythm and normal heart sounds.   Pulmonary/Chest: Effort normal and breath sounds normal.  Abdominal: Soft. Bowel sounds are normal. She exhibits no mass. There is no tenderness.  Musculoskeletal: Normal range of motion.  Lymphadenopathy:    She has no cervical adenopathy.  Neurological: She is alert and oriented to person, place, and time.  Skin: Skin is warm and dry. No rash noted.  Psychiatric: She has a normal mood and affect. Her behavior is normal.          Assessment & Plan:   Hypertension.  Well-controlled.  Repeat blood pressure 120/78 Dyslipidemia.  Will check a lipid profile Hypothyroidism.  Levothyroxine refilled.  Will check a TSH Ongoing tobacco use.  Total smoking cessation encouraged  Recheck 6 months

## 2015-05-01 NOTE — Patient Instructions (Signed)
Limit your sodium (Salt) intake  Please check your blood pressure on a regular basis.  If it is consistently greater than 150/90, please make an office appointment.  Smoking tobacco is very bad for your health. You should stop smoking immediately.  Return in 6 months for follow-up  Cardiac Diet This diet can help prevent heart disease and stroke. Many factors influence your heart health, including eating and exercise habits. Coronary risk rises a lot with abnormal blood fat (lipid) levels. Cardiac meal planning includes limiting unhealthy fats, increasing healthy fats, and making other small dietary changes. General guidelines are as follows:  Adjust calorie intake to reach and maintain desirable body weight.  Limit total fat intake to less than 30% of total calories. Saturated fat should be less than 7% of calories.  Saturated fats are found in animal products and in some vegetable products. Saturated vegetable fats are found in coconut oil, cocoa butter, palm oil, and palm kernel oil. Read labels carefully to avoid these products as much as possible. Use butter in moderation. Choose tub margarines and oils that have 2 grams of fat or less. Good cooking oils are canola and olive oils.  Practice low-fat cooking techniques. Do not fry food. Instead, broil, bake, boil, steam, grill, roast on a rack, stir-fry, or microwave it. Other fat reducing suggestions include:  Remove the skin from poultry.  Remove all visible fat from meats.  Skim the fat off stews, soups, and gravies before serving them.  Steam vegetables in water or broth instead of sauting them in fat.  Avoid foods with trans fat (or hydrogenated oils), such as commercially fried foods and commercially baked goods. Commercial shortening and deep-frying fats will contain trans fat.  Increase intake of fruits, vegetables, whole grains, and legumes to replace foods high in fat.  Increase consumption of nuts, legumes, and seeds to  at least 4 servings weekly. One serving of a legume equals  cup, and 1 serving of nuts or seeds equals  cup.  Choose whole grains more often. Have 3 servings per day (a serving is 1 ounce [oz]).  Eat 4 to 5 servings of vegetables per day. A serving of vegetables is 1 cup of raw leafy vegetables;  cup of raw or cooked cut-up vegetables;  cup of vegetable juice.  Eat 4 to 5 servings of fruit per day. A serving of fruit is 1 medium whole fruit;  cup of dried fruit;  cup of fresh, frozen, or canned fruit;  cup of 100% fruit juice.  Increase your intake of dietary fiber to 20 to 30 grams per day. Insoluble fiber may help lower your risk of heart disease and may help curb your appetite.  Soluble fiber binds cholesterol to be removed from the blood. Foods high in soluble fiber are dried beans, citrus fruits, oats, apples, bananas, broccoli, Brussels sprouts, and eggplant.  Try to include foods fortified with plant sterols or stanols, such as yogurt, breads, juices, or margarines. Choose several fortified foods to achieve a daily intake of 2 to 3 grams of plant sterols or stanols.  Foods with omega-3 fats can help reduce your risk of heart disease. Aim to have a 3.5 oz portion of fatty fish twice per week, such as salmon, mackerel, albacore tuna, sardines, lake trout, or herring. If you wish to take a fish oil supplement, choose one that contains 1 gram of both DHA and EPA.  Limit processed meats to 2 servings (3 oz portion) weekly.  Limit the sodium  in your diet to 1500 milligrams (mg) per day. If you have high blood pressure, talk to a registered dietitian about a DASH (Dietary Approaches to Stop Hypertension) eating plan.  Limit sweets and beverages with added sugar, such as soda, to no more than 5 servings per week. One serving is:   1 tablespoon sugar.  1 tablespoon jelly or jam.   cup sorbet.  1 cup lemonade.   cup regular soda. CHOOSING FOODS Starches  Allowed: Breads: All  kinds (wheat, rye, raisin, white, oatmeal, [place], [place], and English muffin bread). Low-fat rolls: English muffins, frankfurter and hamburger buns, bagels, pita bread, tortillas (not fried). Pancakes, waffles, biscuits, and muffins made with recommended oil.  Avoid: Products made with saturated or trans fats, oils, or whole milk products. Butter rolls, cheese breads, croissants. Commercial doughnuts, muffins, sweet rolls, biscuits, waffles, pancakes, store-bought mixes. Crackers  Allowed: Low-fat crackers and snacks: Animal, graham, rye, saltine (with recommended oil, no lard), oyster, and matzo crackers. Bread sticks, melba toast, rusks, flatbread, pretzels, and light popcorn.  Avoid: High-fat crackers: cheese crackers, butter crackers, and those made with coconut, palm oil, or trans fat (hydrogenated oils). Buttered popcorn. Cereals  Allowed: Hot or cold whole-grain cereals.  Avoid: Cereals containing coconut, hydrogenated vegetable fat, or animal fat. Potatoes / Pasta / Rice  Allowed: All kinds of potatoes, rice, and pasta (such as macaroni, spaghetti, and noodles).  Avoid: Pasta or rice prepared with cream sauce or high-fat cheese. Chow mein noodles, [place] fries. Vegetables  Allowed: All vegetables and vegetable juices.  Avoid: Fried vegetables. Vegetables in cream, butter, or high-fat cheese sauces. Limit coconut. Fruit in cream or custard. Protein  Allowed: Limit your intake of meat, seafood, and poultry to no more than 6 oz (cooked weight) per day. All lean, well-trimmed beef, veal, pork, and lamb. All chicken and [place] without skin. All fish and shellfish. Wild game: wild duck, rabbit, pheasant, and venison. Egg whites or low-cholesterol egg substitutes may be used as desired. Meatless dishes: recipes with dried beans, peas, lentils, and tofu (soybean curd). Seeds and nuts: all seeds and most nuts.  Avoid: Prime grade and other heavily marbled and fatty meats, such as short  ribs, spare ribs, rib eye roast or steak, frankfurters, sausage, bacon, and high-fat luncheon meats, mutton. Caviar. Commercially fried fish. Domestic duck, goose, venison sausage. Organ meats: liver, gizzard, heart, chitterlings, brains, kidney, sweetbreads. Dairy  Allowed: Low-fat cheeses: nonfat or low-fat cottage cheese (1% or 2% fat), cheeses made with part skim milk, such as mozzarella, farmers, string, or ricotta. (Cheeses should be labeled no more than 2 to 6 grams fat per oz.). Skim (or 1%) milk: liquid, powdered, or evaporated. Buttermilk made with low-fat milk. Drinks made with skim or low-fat milk or cocoa. Chocolate milk or cocoa made with skim or low-fat (1%) milk. Nonfat or low-fat yogurt.  Avoid: Whole milk cheeses, including colby, cheddar, [place], Monterey Jack, [place], [place], [place], American, Swiss, and blue. Creamed cottage cheese, cream cheese. Whole milk and whole milk products, including buttermilk or yogurt made from whole milk, drinks made from whole milk. Condensed milk, evaporated whole milk, and 2% milk. Soups and Combination Foods  Allowed: Low-fat low-sodium soups: broth, dehydrated soups, homemade broth, soups with the fat removed, homemade cream soups made with skim or low-fat milk. Low-fat spaghetti, lasagna, chili, and Spanish rice if low-fat ingredients and low-fat cooking techniques are used.  Avoid: Cream soups made with whole milk, cream, or high-fat cheese. All other soups. Desserts and Sweets  Allowed: Sherbet, fruit ices, gelatins, meringues, and angel food cake. Homemade desserts with recommended fats, oils, and milk products. Jam, jelly, honey, marmalade, sugars, and syrups. Pure sugar candy, such as gum drops, hard candy, jelly beans, marshmallows, mints, and small amounts of dark chocolate.  Avoid: Commercially prepared cakes, pies, cookies, frosting, pudding, or mixes for these products. Desserts containing whole milk products, chocolate, coconut,  lard, palm oil, or palm kernel oil. Ice cream or ice cream drinks. Candy that contains chocolate, coconut, butter, hydrogenated fat, or unknown ingredients. Buttered syrups. Fats and Oils  Allowed: Vegetable oils: safflower, sunflower, corn, soybean, cottonseed, sesame, canola, olive, or peanut. Non-hydrogenated margarines. Salad dressing or mayonnaise: homemade or commercial, made with a recommended oil. Low or nonfat salad dressing or mayonnaise.  Limit added fats and oils to 6 to 8 tsp per day (includes fats used in cooking, baking, salads, and spreads on bread). Remember to count the "hidden fats" in foods.  Avoid: Solid fats and shortenings: butter, lard, salt pork, bacon drippings. Gravy containing meat fat, shortening, or suet. Cocoa butter, coconut. Coconut oil, palm oil, palm kernel oil, or hydrogenated oils: these ingredients are often used in bakery products, nondairy creamers, whipped toppings, candy, and commercially fried foods. Read labels carefully. Salad dressings made of unknown oils, sour cream, or cheese, such as blue cheese and Roquef[place]. Cream, all kinds: half-and-half, light, heavy, or whipping. Sour cream or cream cheese (even if "light" or low-fat). Nondairy cream substitutes: coffee creamers and sour cream substitutes made with palm, palm kernel, hydrogenated oils, or coconut oil. Beverages  Allowed: Coffee (regular or decaffeinated), tea. Diet carbonated beverages, mineral water. Alcohol: Check with your caregiver. Moderation is recommended.  Avoid: Whole milk, regular sodas, and juice drinks with added sugar. Condiments  Allowed: All seasonings and condiments. Cocoa powder. "Cream" sauces made with recommended ingredients.  Avoid: Carob powder made with hydrogenated fats. SAMPLE MENU Breakfast   cup orange juice   cup oatmeal  1 slice toast  1 tsp margarine  1 cup skim milk Lunch  [place] sandwich with 2 oz [place], 2 slices bread  Lettuce and tomato  slices  Fresh fruit  Carrot sticks  Coffee or tea Snack  Fresh fruit or low-fat crackers Dinner  3 oz lean ground beef  1 baked potato  1 tsp margarine   cup asparagus  Lettuce salad  1 tbs non-creamy dressing   cup peach slices  1 cup skim milk Document Released: 06/21/2008 Document Revised: 03/13/2012 Document Reviewed: 11/12/2013 Exit[employer] Patient Information 2015 [place], [place]. This information is not intended to replace advice given to you by your health care provider. Make sure you discuss any questions you have with your health care provider.

## 2015-05-04 ENCOUNTER — Other Ambulatory Visit: Payer: Self-pay | Admitting: *Deleted

## 2015-05-04 DIAGNOSIS — E039 Hypothyroidism, unspecified: Secondary | ICD-10-CM

## 2015-05-04 MED ORDER — LEVOTHYROXINE SODIUM 88 MCG PO TABS: 88.0000 ug | ORAL_TABLET | Freq: Every day | ORAL | Status: DC

## 2015-06-24 ENCOUNTER — Other Ambulatory Visit (INDEPENDENT_AMBULATORY_CARE_PROVIDER_SITE_OTHER): Payer: Medicare Other

## 2015-06-24 DIAGNOSIS — E039 Hypothyroidism, unspecified: Secondary | ICD-10-CM

## 2015-06-24 LAB — TSH: TSH: 4.09 u[IU]/mL (ref 0.35–4.50)

## 2015-06-29 ENCOUNTER — Encounter: Payer: Self-pay | Admitting: Internal Medicine

## 2015-06-30 ENCOUNTER — Telehealth: Payer: Self-pay | Admitting: Internal Medicine

## 2015-06-30 NOTE — Telephone Encounter (Signed)
Please advise, see pt advise request message in your basket.

## 2015-06-30 NOTE — Telephone Encounter (Signed)
Pt daughter can view the blood work results on [employer] however pt will be travelling out of town and daughter is concerning about medications

## 2015-08-05 ENCOUNTER — Other Ambulatory Visit: Payer: Self-pay

## 2015-08-05 MED ORDER — SIMVASTATIN 10 MG PO TABS: 10.0000 mg | ORAL_TABLET | Freq: Every day | ORAL | Status: DC

## 2015-10-01 ENCOUNTER — Other Ambulatory Visit: Payer: Self-pay | Admitting: Internal Medicine

## 2015-10-08 ENCOUNTER — Other Ambulatory Visit: Payer: Self-pay | Admitting: Internal Medicine

## 2015-10-25 ENCOUNTER — Other Ambulatory Visit: Payer: Self-pay | Admitting: Family Medicine

## 2015-11-18 ENCOUNTER — Encounter: Payer: Self-pay | Admitting: Internal Medicine

## 2015-11-18 ENCOUNTER — Ambulatory Visit (INDEPENDENT_AMBULATORY_CARE_PROVIDER_SITE_OTHER): Payer: Medicare Other | Admitting: Internal Medicine

## 2015-11-18 VITALS — BP 108/70 | HR 84 | Temp 97.7°F | Resp 20 | Ht 66.0 in | Wt 160.0 lb

## 2015-11-18 DIAGNOSIS — I10 Essential (primary) hypertension: Secondary | ICD-10-CM | POA: Diagnosis not present

## 2015-11-18 DIAGNOSIS — E039 Hypothyroidism, unspecified: Secondary | ICD-10-CM

## 2015-11-18 DIAGNOSIS — E860 Dehydration: Secondary | ICD-10-CM

## 2015-11-18 LAB — CBC WITH DIFFERENTIAL/PLATELET
BASOS ABS: 0 10*3/uL (ref 0.0–0.1)
BASOS PCT: 0.2 % (ref 0.0–3.0)
Eosinophils Absolute: 0 10*3/uL (ref 0.0–0.7)
Eosinophils Relative: 0 % (ref 0.0–5.0)
HEMATOCRIT: 40.8 % (ref 36.0–46.0)
Hemoglobin: 13.6 g/dL (ref 12.0–15.0)
LYMPHS ABS: 0.5 10*3/uL — AB (ref 0.7–4.0)
MCHC: 33.3 g/dL (ref 30.0–36.0)
MCV: 88.8 fl (ref 78.0–100.0)
MONOS PCT: 9.4 % (ref 3.0–12.0)
Monocytes Absolute: 0.7 10*3/uL (ref 0.1–1.0)
NEUTROS ABS: 6.2 10*3/uL (ref 1.4–7.7)
NEUTROS PCT: 83.3 % — AB (ref 43.0–77.0)
PLATELETS: 216 10*3/uL (ref 150.0–400.0)
RBC: 4.6 Mil/uL (ref 3.87–5.11)
RDW: 14.9 % (ref 11.5–15.5)
WBC: 7.4 10*3/uL (ref 4.0–10.5)

## 2015-11-18 LAB — COMPREHENSIVE METABOLIC PANEL
ALT: 24 U/L (ref 0–35)
AST: 52 U/L — AB (ref 0–37)
Albumin: 3.7 g/dL (ref 3.5–5.2)
Alkaline Phosphatase: 69 U/L (ref 39–117)
BILIRUBIN TOTAL: 0.8 mg/dL (ref 0.2–1.2)
BUN: 26 mg/dL — ABNORMAL HIGH (ref 6–23)
CHLORIDE: 105 meq/L (ref 96–112)
CO2: 30 meq/L (ref 19–32)
Calcium: 8.9 mg/dL (ref 8.4–10.5)
Creatinine, Ser: 0.85 mg/dL (ref 0.40–1.20)
GFR: 67.76 mL/min (ref >60.00)
GLUCOSE: 131 mg/dL — AB (ref 70–99)
Potassium: 3.9 mEq/L (ref 3.5–5.1)
Sodium: 142 mEq/L (ref 135–145)
Total Protein: 6.4 g/dL (ref 6.0–8.3)

## 2015-11-18 LAB — TSH: TSH: 10.64 u[IU]/mL — AB (ref 0.35–4.50)

## 2015-11-18 NOTE — Progress Notes (Signed)
Subjective:    Patient ID: Dawn Woods, female    DOB: October 27, 1929, 82 y.o.   MRN: [id number]  HPI  82 year old patient who is seen today for an acute visit.  [place] left [place].  7 days ago and returned 2 days ago.  She has had some increasing cough, fatigue, unsteady gait.  She has been eating and drinking very poorly.  Her daughter has spent the last 24 hours with the patient and has noticed some improvement with better oral intake.  The patient was noted be slightly confused yesterday.  There is been some intermittent cough. She has a history of treated hypertension and has been on the Avandia.  Past Medical History  Diagnosis Date  . GERD (gastroesophageal reflux disease)   . Hyperlipidemia   . Hypertension   . Hypothyroidism   . H/O: hematuria 07/2010  . PONV (postoperative nausea and vomiting)     Social History   Social History  . Marital Status: Widowed    Spouse Name: N/A  . Number of Children: 3  . Years of Education: N/A   Occupational History  . Not on file.   Social History Main Topics  . Smoking status: Former Smoker -- 0.50 packs/day for 63 years    Types: Cigarettes    Quit date: 08/05/2013  . Smokeless tobacco: Never Used  . Alcohol Use: No  . Drug Use: No  . Sexual Activity: Not on file   Other Topics Concern  . Not on file   Social History Narrative    Past Surgical History  Procedure Laterality Date  . Appendectomy      age 69  . Oophorectomy  1954  . Tonsillectomy      age 31  . Cholecystectomy      age 12  . Dilation and curettage of uterus      multiple's  . Fracture surgery  Dec 2009    left wrist  . Esophagogastroduodenoscopy N/A 08/06/2013    Procedure: ESOPHAGOGASTRODUODENOSCOPY (EGD);  Surgeon: Lear Ng, MD;  Location: [facility];  Service: Endoscopy;  Laterality: N/A;    Family History  Problem Relation Age of Onset  . Thyroid disease Sister 53    Allergies  Allergen Reactions  .  Aspirin Other (See Comments)    GI Bleed  . Ibuprofen Other (See Comments)    GI Bleed  . Ciprofloxacin     REACTION: hives    Current Outpatient Prescriptions on File Prior to Visit  Medication Sig Dispense Refill  . cetirizine (ZYRTEC) 10 MG tablet Take 10 mg by mouth daily.    . ferrous sulfate 325 (65 FE) MG tablet Take 325 mg by mouth daily with breakfast.    . mometasone (NASONEX) 50 MCG/ACT nasal spray Place 2 sprays into the nose daily.    . pantoprazole (PROTONIX) 40 MG tablet Take 1 tablet (40 mg total) by mouth 2 (two) times daily. 180 tablet 3  . simvastatin (ZOCOR) 10 MG tablet Take 1 tablet (10 mg total) by mouth at bedtime. 90 tablet 1  . SYNTHROID 88 MCG tablet TAKE 1 TABLET DAILY (DOSAGECHANGE) 90 tablet 1  . valsartan (DIOVAN) 160 MG tablet TAKE 1 TABLET DAILY 90 tablet 1   No current facility-administered medications on file prior to visit.    BP 108/70 mmHg  Pulse 84  Temp(Src) 97.7 F (36.5 C) (Oral)  Resp 20  Ht 5\' 6"  (1.676 m)  Wt 160 lb (72.576 kg)  BMI 25.84 kg/m2  SpO2 92%     Review of Systems  Constitutional: Positive for activity change, appetite change and fatigue.  HENT: Negative for congestion, dental problem, hearing loss, rhinorrhea, sinus pressure, sore throat and tinnitus.   Eyes: Negative for pain, discharge and visual disturbance.  Respiratory: Positive for cough. Negative for shortness of breath.   Cardiovascular: Negative for chest pain, palpitations and leg swelling.  Gastrointestinal: Negative for nausea, vomiting, abdominal pain, diarrhea, constipation, blood in stool and abdominal distention.  Genitourinary: Negative for dysuria, urgency, frequency, hematuria, flank pain, vaginal bleeding, vaginal discharge, difficulty urinating, vaginal pain and pelvic pain.  Musculoskeletal: Negative for joint swelling, arthralgias and gait problem.  Skin: Negative for rash.  Neurological: Positive for dizziness and weakness. Negative for  syncope, speech difficulty, numbness and headaches.  Hematological: Negative for adenopathy.  Psychiatric/Behavioral: Positive for confusion. Negative for behavioral problems, dysphoric mood and agitation. The patient is not nervous/anxious.        Objective:   Physical Exam  Constitutional: She is oriented to person, place, and time. She appears well-developed and well-nourished.  Blood pressure 100/64 Appears weak but alert and appropriate Afebrile O2 saturation 94%  HENT:  Head: Normocephalic.  Right Ear: External ear normal.  Left Ear: External ear normal.  Mouth/Throat: Oropharynx is clear and moist.  Eyes: Conjunctivae and EOM are normal. Pupils are equal, round, and reactive to light.  Neck: Normal range of motion. Neck supple. No thyromegaly present.  Cardiovascular: Normal rate, regular rhythm, normal heart sounds and intact distal pulses.   No tachycardia  Pulmonary/Chest: Effort normal.  Few scattered coarse rhonchi No increased work of breathing  O2 saturation 94%  Abdominal: Soft. Bowel sounds are normal. She exhibits no mass. There is no tenderness.  Musculoskeletal: Normal range of motion. She exhibits edema.  Lymphadenopathy:    She has no cervical adenopathy.  Neurological: She is alert and oriented to person, place, and time.  Skin: Skin is warm and dry. No rash noted.  Psychiatric: She has a normal mood and affect. Her behavior is normal.          Assessment & Plan:   Hypotension in the setting of volume contraction.  Will force fluids and other nutrients.  Patient has improved significantly over the past 24 hours with rehydration attempts will hold the Avandia and recheck in one week Essential hypertension.  Hold the Avandia.  Recheck in one week Dyslipidemia History of melena.  Will check a CBC

## 2015-11-18 NOTE — Patient Instructions (Signed)
Drink as much fluid as you  can tolerate over the next few days  Hold Diovan  Call or return to clinic prn if these symptoms worsen or fail to improve as anticipated.

## 2015-11-23 ENCOUNTER — Emergency Department (HOSPITAL_COMMUNITY): Payer: Medicare Other

## 2015-11-23 ENCOUNTER — Telehealth: Payer: Self-pay | Admitting: Internal Medicine

## 2015-11-23 ENCOUNTER — Inpatient Hospital Stay (HOSPITAL_COMMUNITY)
Admission: EM | Admit: 2015-11-23 | Discharge: 2015-11-28 | DRG: 689 | Disposition: A | Discharge status: Skilled Nursing Facility | Payer: Medicare Other | Attending: Internal Medicine | Admitting: Internal Medicine

## 2015-11-23 ENCOUNTER — Encounter (HOSPITAL_COMMUNITY): Payer: Self-pay | Admitting: Emergency Medicine

## 2015-11-23 DIAGNOSIS — E860 Dehydration: Secondary | ICD-10-CM | POA: Diagnosis present

## 2015-11-23 DIAGNOSIS — R4182 Altered mental status, unspecified: Secondary | ICD-10-CM | POA: Diagnosis not present

## 2015-11-23 DIAGNOSIS — S3992XA Unspecified injury of lower back, initial encounter: Secondary | ICD-10-CM | POA: Diagnosis not present

## 2015-11-23 DIAGNOSIS — K219 Gastro-esophageal reflux disease without esophagitis: Secondary | ICD-10-CM | POA: Diagnosis present

## 2015-11-23 DIAGNOSIS — Z720 Tobacco use: Secondary | ICD-10-CM | POA: Diagnosis present

## 2015-11-23 DIAGNOSIS — I714 Abdominal aortic aneurysm, without rupture: Secondary | ICD-10-CM | POA: Diagnosis present

## 2015-11-23 DIAGNOSIS — R29898 Other symptoms and signs involving the musculoskeletal system: Secondary | ICD-10-CM

## 2015-11-23 DIAGNOSIS — F329 Major depressive disorder, single episode, unspecified: Secondary | ICD-10-CM | POA: Diagnosis present

## 2015-11-23 DIAGNOSIS — R2681 Unsteadiness on feet: Secondary | ICD-10-CM | POA: Diagnosis present

## 2015-11-23 DIAGNOSIS — R0902 Hypoxemia: Secondary | ICD-10-CM

## 2015-11-23 DIAGNOSIS — N390 Urinary tract infection, site not specified: Secondary | ICD-10-CM | POA: Diagnosis not present

## 2015-11-23 DIAGNOSIS — G9340 Encephalopathy, unspecified: Secondary | ICD-10-CM | POA: Diagnosis not present

## 2015-11-23 DIAGNOSIS — I422 Other hypertrophic cardiomyopathy: Secondary | ICD-10-CM | POA: Diagnosis present

## 2015-11-23 DIAGNOSIS — M25562 Pain in left knee: Secondary | ICD-10-CM

## 2015-11-23 DIAGNOSIS — E039 Hypothyroidism, unspecified: Secondary | ICD-10-CM | POA: Diagnosis present

## 2015-11-23 DIAGNOSIS — E876 Hypokalemia: Secondary | ICD-10-CM | POA: Diagnosis present

## 2015-11-23 DIAGNOSIS — N12 Tubulo-interstitial nephritis, not specified as acute or chronic: Principal | ICD-10-CM | POA: Diagnosis present

## 2015-11-23 DIAGNOSIS — J189 Pneumonia, unspecified organism: Secondary | ICD-10-CM | POA: Diagnosis not present

## 2015-11-23 DIAGNOSIS — R05 Cough: Secondary | ICD-10-CM | POA: Diagnosis not present

## 2015-11-23 DIAGNOSIS — R531 Weakness: Secondary | ICD-10-CM

## 2015-11-23 DIAGNOSIS — M545 Low back pain: Secondary | ICD-10-CM | POA: Diagnosis not present

## 2015-11-23 DIAGNOSIS — R410 Disorientation, unspecified: Secondary | ICD-10-CM

## 2015-11-23 DIAGNOSIS — J439 Emphysema, unspecified: Secondary | ICD-10-CM | POA: Diagnosis present

## 2015-11-23 DIAGNOSIS — E8770 Fluid overload, unspecified: Secondary | ICD-10-CM

## 2015-11-23 DIAGNOSIS — E440 Moderate protein-calorie malnutrition: Secondary | ICD-10-CM | POA: Insufficient documentation

## 2015-11-23 DIAGNOSIS — Z6828 Body mass index (BMI) 28.0-28.9, adult: Secondary | ICD-10-CM

## 2015-11-23 DIAGNOSIS — J42 Unspecified chronic bronchitis: Secondary | ICD-10-CM | POA: Diagnosis present

## 2015-11-23 DIAGNOSIS — S79912A Unspecified injury of left hip, initial encounter: Secondary | ICD-10-CM | POA: Diagnosis not present

## 2015-11-23 DIAGNOSIS — I10 Essential (primary) hypertension: Secondary | ICD-10-CM | POA: Diagnosis present

## 2015-11-23 DIAGNOSIS — E785 Hyperlipidemia, unspecified: Secondary | ICD-10-CM | POA: Diagnosis present

## 2015-11-23 DIAGNOSIS — Z886 Allergy status to analgesic agent status: Secondary | ICD-10-CM

## 2015-11-23 DIAGNOSIS — Z87891 Personal history of nicotine dependence: Secondary | ICD-10-CM

## 2015-11-23 DIAGNOSIS — M25552 Pain in left hip: Secondary | ICD-10-CM | POA: Diagnosis not present

## 2015-11-23 DIAGNOSIS — M79605 Pain in left leg: Secondary | ICD-10-CM

## 2015-11-23 DIAGNOSIS — F0390 Unspecified dementia without behavioral disturbance: Secondary | ICD-10-CM | POA: Diagnosis present

## 2015-11-23 DIAGNOSIS — Z9049 Acquired absence of other specified parts of digestive tract: Secondary | ICD-10-CM

## 2015-11-23 DIAGNOSIS — J9601 Acute respiratory failure with hypoxia: Secondary | ICD-10-CM

## 2015-11-23 LAB — URINALYSIS, ROUTINE W REFLEX MICROSCOPIC
Bilirubin Urine: NEGATIVE
Glucose, UA: NEGATIVE mg/dL
Ketones, ur: NEGATIVE mg/dL
Nitrite: POSITIVE — AB
PH: 6 (ref 5.0–8.0)
Protein, ur: NEGATIVE mg/dL
SPECIFIC GRAVITY, URINE: 1.023 (ref 1.005–1.030)

## 2015-11-23 LAB — COMPREHENSIVE METABOLIC PANEL
ALBUMIN: 3.5 g/dL (ref 3.5–5.0)
ALT: 22 U/L (ref 14–54)
ANION GAP: 9 (ref 5–15)
AST: 35 U/L (ref 15–41)
Alkaline Phosphatase: 65 U/L (ref 38–126)
BUN: 15 mg/dL (ref 6–20)
CHLORIDE: 102 mmol/L (ref 101–111)
CO2: 30 mmol/L (ref 22–32)
Calcium: 8.6 mg/dL — ABNORMAL LOW (ref 8.9–10.3)
Creatinine, Ser: 0.79 mg/dL (ref 0.44–1.00)
GFR calc Af Amer: >60 mL/min (ref >60)
GFR calc non Af Amer: >60 mL/min (ref >60)
GLUCOSE: 113 mg/dL — AB (ref 65–99)
POTASSIUM: 3 mmol/L — AB (ref 3.5–5.1)
SODIUM: 141 mmol/L (ref 135–145)
TOTAL PROTEIN: 6.8 g/dL (ref 6.5–8.1)
Total Bilirubin: 0.7 mg/dL (ref 0.3–1.2)

## 2015-11-23 LAB — CBC
HEMATOCRIT: 46.2 % — AB (ref 36.0–46.0)
HEMOGLOBIN: 14.5 g/dL (ref 12.0–15.0)
MCH: 29.3 pg (ref 26.0–34.0)
MCHC: 31.4 g/dL (ref 30.0–36.0)
MCV: 93.3 fL (ref 78.0–100.0)
Platelets: 214 10*3/uL (ref 150–400)
RBC: 4.95 MIL/uL (ref 3.87–5.11)
RDW: 14.3 % (ref 11.5–15.5)
WBC: 6.1 10*3/uL (ref 4.0–10.5)

## 2015-11-23 LAB — URINE MICROSCOPIC-ADD ON

## 2015-11-23 LAB — I-STAT CG4 LACTIC ACID, ED: LACTIC ACID, VENOUS: 1.11 mmol/L (ref 0.5–2.0)

## 2015-11-23 LAB — CBG MONITORING, ED: Glucose-Capillary: 113 mg/dL — ABNORMAL HIGH (ref 65–99)

## 2015-11-23 MED ORDER — DEXTROSE 5 % IV SOLN
1.0000 g | Freq: Once | INTRAVENOUS | Status: AC
  Administered 2015-11-23: 1 g via INTRAVENOUS
  Filled 2015-11-23: qty 10

## 2015-11-23 MED ORDER — SODIUM CHLORIDE 0.9 % IV BOLUS (SEPSIS)
1000.0000 mL | Freq: Once | INTRAVENOUS | Status: AC
  Administered 2015-11-23: 1000 mL via INTRAVENOUS

## 2015-11-23 NOTE — Telephone Encounter (Signed)
Called and discussed.  Daughter agreeable to taking the patient to the ED for further evaluation and therapy to rule out CVA.  Aware.  She likely will need hospital admission

## 2015-11-23 NOTE — Telephone Encounter (Signed)
Please see message and advise 

## 2015-11-23 NOTE — ED Provider Notes (Signed)
CSN: [id number]     Arrival date & time 11/23/15  1833 History   First MD Initiated Contact with Patient 11/23/15 2129     Chief Complaint  Patient presents with  . Altered Mental Status  . Fatigue     (Consider location/radiation/quality/duration/timing/severity/associated sxs/prior Treatment) HPI Comments: 2 weeks ago was doing well, sleepy Went to [facility] for a wedding, needed more help getting up and down Tuesday AM, fatigued, went to bathroom, difficulty standing from toilet Drank water Monday, nothing to eat/drink all day Tuesday Wednesday went to Dr. Cristi Loron blood pressure medications, appeared dehydrated Thursday-now, not able to get up on own, difficulty getting off toilet Has not had coffee since last week, drinks it usually Slid onto floor with wheely chair, did not hit head, did not LOC, pt denies HA L lower quadrant abdominal pain for about 1 week      Patient is a 82 y.o. female presenting with altered mental status.  Altered Mental Status Associated symptoms: abdominal pain and weakness   Associated symptoms: no fever, no headaches, no nausea, no rash and no vomiting     Past Medical History  Diagnosis Date  . GERD (gastroesophageal reflux disease)   . Hyperlipidemia   . Hypertension   . Hypothyroidism   . H/O: hematuria 07/2010  . PONV (postoperative nausea and vomiting)    Past Surgical History  Procedure Laterality Date  . Appendectomy      age 36  . Oophorectomy  1954  . Tonsillectomy      age 65  . Cholecystectomy      age 42  . Dilation and curettage of uterus      multiple's  . Fracture surgery  Dec 2009    left wrist  . Esophagogastroduodenoscopy N/A 08/06/2013    Procedure: ESOPHAGOGASTRODUODENOSCOPY (EGD);  Surgeon: Lear Ng, MD;  Location: [facility];  Service: Endoscopy;  Laterality: N/A;   Family History  Problem Relation Age of Onset  . Thyroid disease Sister 59   Social History  Substance Use Topics  .  Smoking status: Former Smoker -- 0.50 packs/day for 63 years    Types: Cigarettes    Quit date: 08/05/2013  . Smokeless tobacco: Never Used  . Alcohol Use: No   OB History    No data available     Review of Systems  Constitutional: Positive for fatigue. Negative for fever and appetite change.  HENT: Negative for sore throat.   Eyes: Negative for visual disturbance.  Respiratory: Negative for cough and shortness of breath.   Cardiovascular: Negative for chest pain.  Gastrointestinal: Positive for abdominal pain. Negative for nausea, vomiting, diarrhea (no change) and constipation.  Genitourinary: Negative for vaginal bleeding, vaginal discharge and difficulty urinating.  Musculoskeletal: Positive for gait problem. Negative for back pain and neck pain.  Skin: Negative for rash.  Neurological: Positive for weakness. Negative for syncope, facial asymmetry, speech difficulty, numbness and headaches.      Allergies  Aspirin; Ibuprofen; and Ciprofloxacin  Home Medications   Prior to Admission medications   Medication Sig Start Date End Date Taking? Authorizing Provider  acetaminophen (TYLENOL) 500 MG tablet Take 1,000 mg by mouth every 6 (six) hours as needed for mild pain.   Yes Historical Provider, MD  cetirizine (ZYRTEC) 10 MG tablet Take 10 mg by mouth daily.   Yes Historical Provider, MD  Cranberry 425 MG CAPS Take 425 mg by mouth 2 (two) times daily.   Yes Historical Provider, MD  ferrous  sulfate 325 (65 FE) MG tablet Take 325 mg by mouth every evening.    Yes Historical Provider, MD  meclizine (ANTIVERT) 25 MG tablet Take 25 mg by mouth every 4 (four) hours as needed. Dizziness 10/26/15  Yes Historical Provider, MD  mometasone (NASONEX) 50 MCG/ACT nasal spray Place 2 sprays into the nose daily.   Yes Historical Provider, MD  pantoprazole (PROTONIX) 40 MG tablet Take 1 tablet (40 mg total) by mouth 2 (two) times daily. 05/01/15  Yes Marletta Lor, MD  simvastatin (ZOCOR) 10  MG tablet Take 1 tablet (10 mg total) by mouth at bedtime. 08/05/15  Yes Marletta Lor, MD  SYNTHROID 88 MCG tablet TAKE 1 TABLET DAILY (DOSAGECHANGE) 10/09/15  Yes Marletta Lor, MD  valsartan (DIOVAN) 160 MG tablet TAKE 1 TABLET DAILY 10/02/15   Marletta Lor, MD   BP 152/105 mmHg  Pulse 65  Temp(Src) 97.7 F (36.5 C) (Oral)  Resp 18  SpO2 95% Physical Exam  Constitutional: She is oriented to person, place, and time. She appears well-developed and well-nourished. No distress.  HENT:  Head: Normocephalic and atraumatic.  Eyes: Conjunctivae and EOM are normal.  Neck: Normal range of motion.  Cardiovascular: Normal rate, regular rhythm, normal heart sounds and intact distal pulses.  Exam reveals no gallop and no friction rub.   No murmur heard. Pulmonary/Chest: Effort normal and breath sounds normal. No respiratory distress. She has no wheezes. She has no rales.  Abdominal: Soft. She exhibits no distension. There is no tenderness. There is no guarding.  Musculoskeletal: She exhibits no edema or tenderness.  Neurological: She is alert and oriented to person, place, and time. No cranial nerve deficit or sensory deficit. Coordination normal. GCS eye subscore is 4. GCS verbal subscore is 5. GCS motor subscore is 6.  4/5 left hip flexion, also reports pain with left hip flexion. Full strength other movements of LLE and bilateral LE.   Oriented to location, self, year, day, not date nor month  Skin: Skin is warm and dry. No rash noted. She is not diaphoretic. No erythema.  Nursing note and vitals reviewed.   ED Course  Procedures (including critical care time) Labs Review Labs Reviewed  COMPREHENSIVE METABOLIC PANEL - Abnormal; Notable for the following:    Potassium 3.0 (*)    Glucose, Bld 113 (*)    Calcium 8.6 (*)    All other components within normal limits  CBC - Abnormal; Notable for the following:    HCT 46.2 (*)    All other components within normal limits   URINALYSIS, ROUTINE W REFLEX MICROSCOPIC (NOT AT [facility]) - Abnormal; Notable for the following:    APPearance CLOUDY (*)    Hgb urine dipstick TRACE (*)    Nitrite POSITIVE (*)    Leukocytes, UA MODERATE (*)    All other components within normal limits  URINE MICROSCOPIC-ADD ON - Abnormal; Notable for the following:    Squamous Epithelial / LPF 0-5 (*)    Bacteria, UA RARE (*)    All other components within normal limits  CBG MONITORING, ED - Abnormal; Notable for the following:    Glucose-Capillary 113 (*)    All other components within normal limits  CULTURE, BLOOD (ROUTINE X 2)  CULTURE, BLOOD (ROUTINE X 2)  TROPONIN I  TROPONIN I  TROPONIN I  I-STAT CG4 LACTIC ACID, ED  I-STAT CG4 LACTIC ACID, ED    Imaging Review Dg Chest 2 View  11/23/2015  CLINICAL DATA:  Weakness and coughing since last week. History of hypertension. EXAM: CHEST  2 VIEW COMPARISON:  08/05/2013 FINDINGS: Cardiac silhouette is mildly enlarged. No mediastinal or hilar masses or evidence of adenopathy. Lungs are hyperexpanded. There are prominent bronchovascular markings. No evidence of pneumonia or pulmonary edema. No pleural effusion or pneumothorax. Bony thorax demineralized but intact. IMPRESSION: No acute cardiopulmonary disease. Electronically Signed   By: Lajean Manes M.D.   On: 11/23/2015 20:56   Dg Lumbar Spine 2-3 Views  11/23/2015  CLINICAL DATA:  Pt c/o left anterior abd pain inguinally s/p fall today, pt seems confused, also c/o weakness. EXAM: LUMBAR SPINE - 2-3 VIEW COMPARISON:  None. FINDINGS: No fracture.  No spondylolisthesis. Moderate loss of disc height at L3-L4-L4-L5 with mild loss of disc height at L5-S1. Bones are diffusely demineralized. There are calcifications along the abdominal aorta. My old focal dilation of the lower abdominal aorta measures approximately 3.2 cm. IMPRESSION: 1. No fracture or acute finding. 2. Lower lumbar spine disc degenerative change. Diffuse bone demineralization. 3.  3.2 cm infrarenal abdominal aortic aneurysm. Recommend followup by ultrasound in 3 years. This recommendation follows ACR consensus guidelines: White Paper of the ACR Incidental Findings Committee II on Vascular Findings. Natasha [facility] Coll Radiol 2013; 10:789-794 Electronically Signed   By: Lajean Manes M.D.   On: 11/23/2015 23:29   Ct Head Wo Contrast  11/23/2015  CLINICAL DATA:  82 year old female with altered mental status EXAM: CT HEAD WITHOUT CONTRAST TECHNIQUE: Contiguous axial images were obtained from the base of the skull through the vertex without intravenous contrast. COMPARISON:  None. FINDINGS: There is no acute intracranial hemorrhage. There is moderate age-related atrophy and chronic microvascular ischemic changes. There is no mass effect or midline shift. There is mild mucoperiosteal thickening of the paranasal sinuses. No air-fluid level. The mastoid air cells are clear. The calvarium is intact. IMPRESSION: No acute intracranial hemorrhage. Age-related atrophy and chronic microvascular ischemic disease. If symptoms persist and there are no contraindications, MRI may provide better evaluation if clinically indicated. Electronically Signed   By: Anner Crete M.D.   On: 11/23/2015 23:11   Dg Hip Unilat With Pelvis 2-3 Views Left  11/23/2015  CLINICAL DATA:  Pt c/o left anterior abd pain inguinally s/p fall today, pt seems confused, also c/o weakness. EXAM: DG HIP (WITH OR WITHOUT PELVIS) 2-3V LEFT COMPARISON:  None. FINDINGS: No fracture.  No bone lesion. Hip joints, SI joints and symphysis pubis are normally spaced and aligned. Bones are demineralized. Soft tissues are unremarkable other than vascular calcifications. IMPRESSION: No fracture or acute finding. Electronically Signed   By: Lajean Manes M.D.   On: 11/23/2015 23:30   I have personally reviewed and evaluated these images and lab results as part of my medical decision-making.   EKG Interpretation None      MDM   Final  diagnoses:  Left leg pain  UTI (lower urinary tract infection)  Acute encephalopathy  Delirium    82 year old female with a history of hypertension, hyperlipidemia, hypothyroidism presents to concern of altered mental status. Patient has had progressive generalized weakness, confusion, fatigue, decreased short term memory and distractibility over last week.  Daughter also reports she has been using her hands to lift her left leg and walking with more of an unsteady gait.  Patient reports both pain and "heaviness" of L leg with movement. Neuro exam otherwise unremarkable. CT head, XR lumbar spine and hip without acute abnormalities.   Pt to have continued evaluation, inpt MR  brain. Cannot rule out intracranial/lumbar spinal etiology of symptoms, and difficult to assess strength vs pain with movement.  Patient found to have UTI, which is likely etiology of fatigue and altered mental status. VS WNL.  Blood cx ordered and pt given rocephin.  Left sided abdominal pain likely secondary to pyelonephritis.  Benign exam and doubt other acoute etiology of abd pain. Patient to be admitted for AMS, likely secondary to UTI and further evaluation for left leg pain/weakness, rule out CVA or other.     Gareth Morgan, MD 11/24/15 [phone]

## 2015-11-23 NOTE — Telephone Encounter (Signed)
Daughter states pt has follow up appointment on Friday, but she would like you to call her to discuss the issue she was seen for last week. They either need sooner appointment or have her admitted somewhere. Would like you to call back.

## 2015-11-23 NOTE — ED Notes (Addendum)
Pt has been acting abnormally, weak and L leg weak and both arms weak since last week. Dehydration per PCP last Tuesday that has resolved. Short term memory not good. Cough. Grip strength equal. Legs equal in strength. Face symmetrical. Alert.

## 2015-11-23 NOTE — ED Notes (Signed)
Patient transported to X-ray 

## 2015-11-24 ENCOUNTER — Encounter (HOSPITAL_COMMUNITY): Payer: Self-pay | Admitting: Internal Medicine

## 2015-11-24 ENCOUNTER — Observation Stay (HOSPITAL_COMMUNITY): Payer: Medicare Other

## 2015-11-24 ENCOUNTER — Telehealth: Payer: Self-pay | Admitting: Internal Medicine

## 2015-11-24 DIAGNOSIS — R4182 Altered mental status, unspecified: Secondary | ICD-10-CM | POA: Diagnosis present

## 2015-11-24 DIAGNOSIS — E876 Hypokalemia: Secondary | ICD-10-CM | POA: Diagnosis present

## 2015-11-24 DIAGNOSIS — R531 Weakness: Secondary | ICD-10-CM

## 2015-11-24 DIAGNOSIS — R0602 Shortness of breath: Secondary | ICD-10-CM | POA: Diagnosis not present

## 2015-11-24 DIAGNOSIS — G9340 Encephalopathy, unspecified: Secondary | ICD-10-CM

## 2015-11-24 DIAGNOSIS — E860 Dehydration: Secondary | ICD-10-CM | POA: Diagnosis present

## 2015-11-24 DIAGNOSIS — I10 Essential (primary) hypertension: Secondary | ICD-10-CM | POA: Diagnosis not present

## 2015-11-24 DIAGNOSIS — N390 Urinary tract infection, site not specified: Secondary | ICD-10-CM | POA: Diagnosis not present

## 2015-11-24 LAB — COMPREHENSIVE METABOLIC PANEL
ALT: 23 U/L (ref 14–54)
ANION GAP: 9 (ref 5–15)
AST: 34 U/L (ref 15–41)
Albumin: 3.6 g/dL (ref 3.5–5.0)
Alkaline Phosphatase: 68 U/L (ref 38–126)
BUN: 12 mg/dL (ref 6–20)
CHLORIDE: 105 mmol/L (ref 101–111)
CO2: 30 mmol/L (ref 22–32)
Calcium: 8.3 mg/dL — ABNORMAL LOW (ref 8.9–10.3)
Creatinine, Ser: 0.7 mg/dL (ref 0.44–1.00)
Glucose, Bld: 102 mg/dL — ABNORMAL HIGH (ref 65–99)
POTASSIUM: 3.4 mmol/L — AB (ref 3.5–5.1)
SODIUM: 144 mmol/L (ref 135–145)
Total Bilirubin: 0.8 mg/dL (ref 0.3–1.2)
Total Protein: 7 g/dL (ref 6.5–8.1)

## 2015-11-24 LAB — TROPONIN I
Troponin I: <0.03 ng/mL (ref <0.031)
Troponin I: 0.15 ng/mL — ABNORMAL HIGH (ref <0.031)
Troponin I: <0.03 ng/mL (ref <0.031)

## 2015-11-24 LAB — CBC
HEMATOCRIT: 46 % (ref 36.0–46.0)
HEMOGLOBIN: 14.7 g/dL (ref 12.0–15.0)
MCH: 29.6 pg (ref 26.0–34.0)
MCHC: 32 g/dL (ref 30.0–36.0)
MCV: 92.6 fL (ref 78.0–100.0)
PLATELETS: 184 10*3/uL (ref 150–400)
RBC: 4.97 MIL/uL (ref 3.87–5.11)
RDW: 14 % (ref 11.5–15.5)
WBC: 7.4 10*3/uL (ref 4.0–10.5)

## 2015-11-24 LAB — TSH: TSH: 16.882 u[IU]/mL — AB (ref 0.350–4.500)

## 2015-11-24 LAB — PHOSPHORUS: PHOSPHORUS: 2.1 mg/dL — AB (ref 2.5–4.6)

## 2015-11-24 LAB — MAGNESIUM: Magnesium: 1.7 mg/dL (ref 1.7–2.4)

## 2015-11-24 LAB — I-STAT CG4 LACTIC ACID, ED: Lactic Acid, Venous: 1.17 mmol/L (ref 0.5–2.0)

## 2015-11-24 MED ORDER — DEXTROSE 5 % IV SOLN
1.0000 g | INTRAVENOUS | Status: AC
  Administered 2015-11-24 – 2015-11-27 (×4): 1 g via INTRAVENOUS
  Filled 2015-11-24 (×4): qty 10

## 2015-11-24 MED ORDER — LEVOTHYROXINE SODIUM 88 MCG PO TABS
88.0000 ug | ORAL_TABLET | Freq: Every day | ORAL | Status: DC
  Administered 2015-11-24 – 2015-11-28 (×5): 88 ug via ORAL
  Filled 2015-11-24 (×9): qty 1

## 2015-11-24 MED ORDER — POTASSIUM CHLORIDE CRYS ER 20 MEQ PO TBCR
30.0000 meq | EXTENDED_RELEASE_TABLET | Freq: Once | ORAL | Status: AC
  Administered 2015-11-24: 30 meq via ORAL
  Filled 2015-11-24: qty 1

## 2015-11-24 MED ORDER — HYDRALAZINE HCL 20 MG/ML IJ SOLN
5.0000 mg | INTRAMUSCULAR | Status: DC | PRN
  Administered 2015-11-24: 5 mg via INTRAVENOUS
  Filled 2015-11-24: qty 1

## 2015-11-24 MED ORDER — ACETAMINOPHEN 325 MG PO TABS
650.0000 mg | ORAL_TABLET | Freq: Four times a day (QID) | ORAL | Status: DC | PRN
  Administered 2015-11-24: 650 mg via ORAL
  Filled 2015-11-24: qty 2

## 2015-11-24 MED ORDER — POTASSIUM CHLORIDE 10 MEQ/100ML IV SOLN
10.0000 meq | INTRAVENOUS | Status: DC
  Administered 2015-11-24 (×2): 10 meq via INTRAVENOUS
  Filled 2015-11-24 (×2): qty 100

## 2015-11-24 MED ORDER — POLYETHYLENE GLYCOL 3350 17 G PO PACK
17.0000 g | PACK | Freq: Every day | ORAL | Status: DC | PRN
  Filled 2015-11-24: qty 1

## 2015-11-24 MED ORDER — SODIUM CHLORIDE 0.9% FLUSH
3.0000 mL | Freq: Two times a day (BID) | INTRAVENOUS | Status: DC
  Administered 2015-11-24 – 2015-11-27 (×8): 3 mL via INTRAVENOUS

## 2015-11-24 MED ORDER — HYDROCODONE-ACETAMINOPHEN 5-325 MG PO TABS: 1.0000 | ORAL_TABLET | ORAL | Status: DC | PRN

## 2015-11-24 MED ORDER — MECLIZINE HCL 25 MG PO TABS
25.0000 mg | ORAL_TABLET | Freq: Three times a day (TID) | ORAL | Status: DC | PRN
  Filled 2015-11-24: qty 1

## 2015-11-24 MED ORDER — METOPROLOL TARTRATE 1 MG/ML IV SOLN
5.0000 mg | Freq: Once | INTRAVENOUS | Status: AC
  Administered 2015-11-24: 5 mg via INTRAVENOUS
  Filled 2015-11-24: qty 5

## 2015-11-24 MED ORDER — ACETAMINOPHEN 650 MG RE SUPP: 650.0000 mg | Freq: Four times a day (QID) | RECTAL | Status: DC | PRN

## 2015-11-24 MED ORDER — ONDANSETRON HCL 4 MG PO TABS: 4.0000 mg | ORAL_TABLET | Freq: Four times a day (QID) | ORAL | Status: DC | PRN

## 2015-11-24 MED ORDER — SIMVASTATIN 10 MG PO TABS
10.0000 mg | ORAL_TABLET | Freq: Every day | ORAL | Status: DC
  Administered 2015-11-24 – 2015-11-27 (×4): 10 mg via ORAL
  Filled 2015-11-24 (×6): qty 1

## 2015-11-24 MED ORDER — SODIUM CHLORIDE 0.9 % IV SOLN: INTRAVENOUS | Status: DC

## 2015-11-24 MED ORDER — PANTOPRAZOLE SODIUM 40 MG PO TBEC
40.0000 mg | DELAYED_RELEASE_TABLET | Freq: Two times a day (BID) | ORAL | Status: DC
  Administered 2015-11-24 – 2015-11-28 (×9): 40 mg via ORAL
  Filled 2015-11-24 (×10): qty 1

## 2015-11-24 MED ORDER — ONDANSETRON HCL 4 MG/2ML IJ SOLN: 4.0000 mg | Freq: Four times a day (QID) | INTRAMUSCULAR | Status: DC | PRN

## 2015-11-24 MED ORDER — FUROSEMIDE 10 MG/ML IJ SOLN
20.0000 mg | Freq: Once | INTRAMUSCULAR | Status: AC
  Administered 2015-11-24: 20 mg via INTRAVENOUS
  Filled 2015-11-24: qty 4

## 2015-11-24 NOTE — ED Notes (Signed)
[place] .

## 2015-11-24 NOTE — Progress Notes (Signed)
PROGRESS NOTE  Dawn Woods [id number] DOB: 1929-11-06 DOA: 11/23/2015 PCP: [name], MD  Summary: 82 year old woman recently [place] state [place], seen by PCP for generalized weakness and diagnosed with volume contraction. Presented to the emergency department 2/27 with ongoing complaints of generalized weakness, left leg weakness, possible short-term memory loss. Admitted for UTI, acute encephalopathy with nonfocal exam  Assessment/Plan: 1. Progressive generalized weakness, fatigue; etiology unclear but possibly related pyelonephritis/UTI. Patient declined to work with physical therapy earlier. Her exam is nonfocal. 2. Acute encephalopathy, short-term memory loss? She recalls the details of her recent trip to [facility]. Her exam appears unremarkable at this point. Possibly related to UTI. 3. Reported left leg weakness, ataxic gait. MRI brain negative for acute process. Exam benign. Patient denies any recent left leg. We'll try to obtain more history from family. Significance of this report is unclear at this time. 4. Elevated troponin. Initial troponin was negative, follow-up troponin also negative. Suspect spurious result. Allergic to ASA. Patient has had no chest pain. EKG sinus rhythm, PVC, borderline prolonged QT. The patient denies chest pain or shortness of breath. 5. UTI, pyelonephritis clinically with improvement/resolution of left-sided flank pain. 6. Hypothyroidism. TSH 16.82. 7. 3.2 cm infrarenal abdominal aortic aneurysm. Recommend follow up by ultrasound in 3 years.   Appears stable at this point. She has a nonfocal exam and significance of reported weakness is unclear. MRI brain negative. Will check carotid ultrasound and echocardiogram. However in the absence of further history would not favor TIA.  Trend troponin but slight elevation is most likely spurious  Continue empiric antibiotics  Replace potassium  Wean oxygen  Code  Status: full code DVT prophylaxis: SCDs Family Communication: none present Disposition Plan: likely home 2/29  [name], MD  [facility]  Pager [phone] If 7PM-7AM, please contact night-coverage at [email or web address], password [facility] 11/24/2015, 12:18 PM    Consultants:    Procedures:    Antibiotics:    HPI/Subjective: Feels well, no chest pain, no SOB, no weakness, no complaints.  Per night team was thought to be volume overloaded overnight by CXR and given Lasix.  Objective: Filed Vitals:   11/24/15 0704 11/24/15 0713 11/24/15 0800 11/24/15 1025  BP: 170/102 147/96 155/95 150/90  Pulse: 58 67 58 75  Temp:      TempSrc:      Resp: 26 24 25 23   SpO2: 96% 96% 93% 98%    Intake/Output Summary (Last 24 hours) at 11/24/15 1218 Last data filed at 11/24/15 0717  Gross per 24 hour  Intake    500 ml  Output    625 ml  Net   -125 ml     Filed Weights   11/24/15 1513  Weight: 79.198 kg (174 lb 9.6 oz)    Exam:    General:  Appears calm and comfortable Eyes: PERRL, normal lids, irises  ENT: grossly normal hearing, lips & tongue Cardiovascular: RRR, no m/r/g. No LE edema. Respiratory: CTA bilaterally, no w/r/r. Normal respiratory effort. Abdomen: soft, ntnd Skin: Appears unremarkable Musculoskeletal: grossly normal tone BUE/BLE, moves all extremities to command, strength 5/5 and symmetric Psychiatric: grossly normal mood and affect, speech fluent and appropriate Neurologic: cranial nerves. Intact, no pronator drift, no dysdiadochokinesis   New data reviewed  complete metabolic panel unremarkable  potassium 3.4  Troponin 0.15  Lactic acid normal  CBC unremarkable  TSH 16.882  MRI brain no acute abnormality   Scheduled Meds: . levothyroxine  88 mcg Oral QAC breakfast  .  pantoprazole  40 mg Oral BID  . simvastatin  10 mg Oral QHS  . sodium chloride flush  3 mL Intravenous Q12H   Continuous Infusions: . [START ON 11/25/2015] cefTRIAXone  (ROCEPHIN)  IV      Principal Problem:   UTI Active Problems:   Essential hypertension   Tobacco abuse   Acute encephalopathy   Hypokalemia   Dehydration   Generalized weakness   Time spent 25 minutes

## 2015-11-24 NOTE — ED Notes (Signed)
Patient transported to MRI 

## 2015-11-24 NOTE — ED Notes (Signed)
14:40 PT CAN GO UP.

## 2015-11-24 NOTE — ED Notes (Signed)
Patient returned from MRI.

## 2015-11-24 NOTE — H&P (Signed)
PCP:  [name], MD    Referring provider [place]   Chief Complaint: Confusion and left leg weakness and/or pain  HPI: Dawn Woods is a 82 y.o. female   has a past medical history of GERD (gastroesophageal reflux disease); Hyperlipidemia; Hypertension; Hypothyroidism; H/O: hematuria (07/2010); and PONV (postoperative nausea and vomiting).   Presented with  1 week history of increasing cough fatigue unsteady gait decrease in by mouth intake she's been more confused than usual. Patient went to [facility] she was already unwell for a wedding over a week ago and since her return has had worsening fatigue. Apparently cannot lift left leg and stating she is in pain. She's been also weak overall is well. Daughter is concerned that she's been having short-term memory loss no facial droop was noted no slurred speech. She had an episode where she slid down on the floor but did not hit her head she has been having some suprapubic discomfort. Family did not endorse any fevers or chills.  When daughter encouraged by mouth intake has symptoms have improved. Patient was seen in the office for this and 22nd of February was diagnosed with hypertension in the setting of volume contraction was recommended to force fluids. Denies any nausea or vomiting, no diarrhea recently no chest pain, no shortness of breath.   IN ER: CT scan showing no acute intracranial changes at age related atrophy, chest x-ray was unremarkable Lumbar spine showed no fractures but there is degenerative cyst changes in lower lumbar spine and 3.2 cm infrarenal abdominal aortic aneurysm noted hip films showed no fractures. Patient appeared to be afebrile hemoglobin 14.5 white blood cell tab count 6.1 she was found to have evidence of UTI lactic acid within normal limits 1.1  Regarding pertinent past history: Patient has history of hypertension and her blood pressure medications have been held due to hypotension  recently. She had her cholecystectomy in 1971, no hx of CAD.   Hospitalist was called for admission for acute encephalopathy and Left leg weakness  Review of Systems:    Pertinent positives include: confusion  Constitutional:  No weight loss, night sweats, Fevers, chills, fatigue, weight loss  HEENT:  No headaches, Difficulty swallowing,Tooth/dental problems,Sore throat,  No sneezing, itching, ear ache, nasal congestion, post nasal drip,  Cardio-vascular:  No chest pain, Orthopnea, PND, anasarca, dizziness, palpitations.no Bilateral lower extremity swelling  GI:  No heartburn, indigestion, abdominal pain, nausea, vomiting, diarrhea, change in bowel habits, loss of appetite, melena, blood in stool, hematemesis Resp:  no shortness of breath at rest. No dyspnea on exertion, No excess mucus, no productive cough, No non-productive cough, No coughing up of blood.No change in color of mucus.No wheezing. Skin:  no rash or lesions. No jaundice GU:  no dysuria, change in color of urine, no urgency or frequency. No straining to urinate.  No flank pain.  Musculoskeletal:  No joint pain or no joint swelling. No decreased range of motion. No back pain.  Psych:  No change in mood or affect. No depression or anxiety. No memory loss.  Neuro: no localizing neurological complaints, no tingling, no weakness, no double vision, no gait abnormality, no slurred speech, no confusion  Otherwise ROS are negative except for above, 10 systems were reviewed  Past Medical History: Past Medical History  Diagnosis Date  . GERD (gastroesophageal reflux disease)   . Hyperlipidemia   . Hypertension   . Hypothyroidism   . H/O: hematuria 07/2010  . PONV (postoperative nausea and vomiting)  Past Surgical History  Procedure Laterality Date  . Appendectomy      age 84  . Oophorectomy  1954  . Tonsillectomy      age 64  . Cholecystectomy      age 48  . Dilation and curettage of uterus      multiple's  .  Fracture surgery  Dec 2009    left wrist  . Esophagogastroduodenoscopy N/A 08/06/2013    Procedure: ESOPHAGOGASTRODUODENOSCOPY (EGD);  Surgeon: Lear Ng, MD;  Location: [facility];  Service: Endoscopy;  Laterality: N/A;     Medications: Prior to Admission medications   Medication Sig Start Date End Date Taking? Authorizing Provider  acetaminophen (TYLENOL) 500 MG tablet Take 1,000 mg by mouth every 6 (six) hours as needed for mild pain.   Yes Historical Provider, MD  cetirizine (ZYRTEC) 10 MG tablet Take 10 mg by mouth daily.   Yes Historical Provider, MD  Cranberry 425 MG CAPS Take 425 mg by mouth 2 (two) times daily.   Yes Historical Provider, MD  ferrous sulfate 325 (65 FE) MG tablet Take 325 mg by mouth every evening.    Yes Historical Provider, MD  meclizine (ANTIVERT) 25 MG tablet Take 25 mg by mouth every 4 (four) hours as needed. Dizziness 10/26/15  Yes Historical Provider, MD  mometasone (NASONEX) 50 MCG/ACT nasal spray Place 2 sprays into the nose daily.   Yes Historical Provider, MD  pantoprazole (PROTONIX) 40 MG tablet Take 1 tablet (40 mg total) by mouth 2 (two) times daily. 05/01/15  Yes Marletta Lor, MD  simvastatin (ZOCOR) 10 MG tablet Take 1 tablet (10 mg total) by mouth at bedtime. 08/05/15  Yes Marletta Lor, MD  SYNTHROID 88 MCG tablet TAKE 1 TABLET DAILY (DOSAGECHANGE) 10/09/15  Yes Marletta Lor, MD  valsartan (DIOVAN) 160 MG tablet TAKE 1 TABLET DAILY 10/02/15   Marletta Lor, MD    Allergies:   Allergies  Allergen Reactions  . Aspirin Other (See Comments)    GI Bleed  . Ibuprofen Other (See Comments)    GI Bleed  . Ciprofloxacin     REACTION: hives    Social History:  Ambulatory   independently  2 weeks ago she was able to drive. But not for the past 2 weeks.  Lives at home alone,   Family has to have her move in     reports that she quit smoking about 2 years ago. Her smoking use included Cigarettes. She has a 31.5  pack-year smoking history. She has never used smokeless tobacco. She reports that she does not drink alcohol or use illicit drugs.     Family History: family history includes Thyroid disease (age of onset: 3) in her sister.    Physical Exam: Patient Vitals for the past 24 hrs:  BP Temp Temp src Pulse Resp SpO2  11/24/15 0124 (!) 152/105 mmHg - - 65 18 95 %  11/23/15 1926 (!) 139/108 mmHg 97.7 F (36.5 C) Oral 76 18 91 %    1. General:  in No Acute distress 2. Psychological: Alert but not Oriented 3. Head/ENT:    Dry Mucous Membranes                          Head Non traumatic, neck supple                            Poor Dentition  4. SKIN:  decreased Skin turgor,  Skin clean Dry and intact no rash 5. Heart: Regular rate and rhythm no Murmur, Rub or gallop 6. Lungs: Clear to auscultation bilaterally, no wheezes or crackles   7. Abdomen: Soft, non-tender, Non distended 8. Lower extremities: no clubbing, cyanosis, or edema 9. Neurologically Grossly intact, moving all 4 extremities equally 10. MSK: Normal range of motion  body mass index is unknown because there is no weight on file.   Labs on Admission:   Results for orders placed or performed during the hospital encounter of 11/23/15 (from the past 24 hour(s))  CBG monitoring, ED     Status: Abnormal   Collection Time: 11/23/15  7:32 PM  Result Value Ref Range   Glucose-Capillary 113 (H) 65 - 99 mg/dL  Comprehensive metabolic panel     Status: Abnormal   Collection Time: 11/23/15  7:39 PM  Result Value Ref Range   Sodium 141 135 - 145 mmol/L   Potassium 3.0 (L) 3.5 - 5.1 mmol/L   Chloride 102 101 - 111 mmol/L   CO2 30 22 - 32 mmol/L   Glucose, Bld 113 (H) 65 - 99 mg/dL   BUN 15 6 - 20 mg/dL   Creatinine, Ser 0.79 0.44 - 1.00 mg/dL   Calcium 8.6 (L) 8.9 - 10.3 mg/dL   Total Protein 6.8 6.5 - 8.1 g/dL   Albumin 3.5 3.5 - 5.0 g/dL   AST 35 15 - 41 U/L   ALT 22 14 - 54 U/L   Alkaline Phosphatase 65 38 - 126 U/L   Total  Bilirubin 0.7 0.3 - 1.2 mg/dL   GFR calc non Af Amer >60 >60 mL/min   GFR calc Af Amer >60 >60 mL/min   Anion gap 9 5 - 15  CBC     Status: Abnormal   Collection Time: 11/23/15  7:39 PM  Result Value Ref Range   WBC 6.1 4.0 - 10.5 K/uL   RBC 4.95 3.87 - 5.11 MIL/uL   Hemoglobin 14.5 12.0 - 15.0 g/dL   HCT 46.2 (H) 36.0 - 46.0 %   MCV 93.3 78.0 - 100.0 fL   MCH 29.3 26.0 - 34.0 pg   MCHC 31.4 30.0 - 36.0 g/dL   RDW 14.3 11.5 - 15.5 %   Platelets 214 150 - 400 K/uL  Urinalysis, Routine w reflex microscopic (not at [facility])     Status: Abnormal   Collection Time: 11/23/15  7:46 PM  Result Value Ref Range   Color, Urine YELLOW YELLOW   APPearance CLOUDY (A) CLEAR   Specific Gravity, Urine 1.023 1.005 - 1.030   pH 6.0 5.0 - 8.0   Glucose, UA NEGATIVE NEGATIVE mg/dL   Hgb urine dipstick TRACE (A) NEGATIVE   Bilirubin Urine NEGATIVE NEGATIVE   Ketones, ur NEGATIVE NEGATIVE mg/dL   Protein, ur NEGATIVE NEGATIVE mg/dL   Nitrite POSITIVE (A) NEGATIVE   Leukocytes, UA MODERATE (A) NEGATIVE  Urine microscopic-add on     Status: Abnormal   Collection Time: 11/23/15  7:46 PM  Result Value Ref Range   Squamous Epithelial / LPF 0-5 (A) NONE SEEN   WBC, UA 6-30 0 - 5 WBC/hpf   RBC / HPF 0-5 0 - 5 RBC/hpf   Bacteria, UA RARE (A) NONE SEEN  I-Stat CG4 Lactic Acid, ED     Status: None   Collection Time: 11/23/15 11:06 PM  Result Value Ref Range   Lactic Acid, Venous 1.11 0.5 - 2.0 mmol/L  UA  evidence of UTI  Lab Results  Component Value Date   HGBA1C 5.9* 08/05/2013    Estimated Creatinine Clearance: 54.3 mL/min (by C-G formula based on Cr of 0.79).  BNP (last 3 results) No results for input(s): PROBNP in the last 8760 hours.  Other results:  I have pearsonaly reviewed this: ECG REPORT Not obtained   There were no vitals filed for this visit.   Cultures: No results found for: SDES, SPECREQUEST, CULT, REPTSTATUS   Radiological Exams on Admission: Dg Chest 2  View  11/23/2015  CLINICAL DATA:  Weakness and coughing since last week. History of hypertension. EXAM: CHEST  2 VIEW COMPARISON:  08/05/2013 FINDINGS: Cardiac silhouette is mildly enlarged. No mediastinal or hilar masses or evidence of adenopathy. Lungs are hyperexpanded. There are prominent bronchovascular markings. No evidence of pneumonia or pulmonary edema. No pleural effusion or pneumothorax. Bony thorax demineralized but intact. IMPRESSION: No acute cardiopulmonary disease. Electronically Signed   By: Lajean Manes M.D.   On: 11/23/2015 20:56   Dg Lumbar Spine 2-3 Views  11/23/2015  CLINICAL DATA:  Pt c/o left anterior abd pain inguinally s/p fall today, pt seems confused, also c/o weakness. EXAM: LUMBAR SPINE - 2-3 VIEW COMPARISON:  None. FINDINGS: No fracture.  No spondylolisthesis. Moderate loss of disc height at L3-L4-L4-L5 with mild loss of disc height at L5-S1. Bones are diffusely demineralized. There are calcifications along the abdominal aorta. My old focal dilation of the lower abdominal aorta measures approximately 3.2 cm. IMPRESSION: 1. No fracture or acute finding. 2. Lower lumbar spine disc degenerative change. Diffuse bone demineralization. 3. 3.2 cm infrarenal abdominal aortic aneurysm. Recommend followup by ultrasound in 3 years. This recommendation follows ACR consensus guidelines: White Paper of the ACR Incidental Findings Committee II on Vascular Findings. Natasha [facility] Coll Radiol 2013; 10:789-794 Electronically Signed   By: Lajean Manes M.D.   On: 11/23/2015 23:29   Ct Head Wo Contrast  11/23/2015  CLINICAL DATA:  82 year old female with altered mental status EXAM: CT HEAD WITHOUT CONTRAST TECHNIQUE: Contiguous axial images were obtained from the base of the skull through the vertex without intravenous contrast. COMPARISON:  None. FINDINGS: There is no acute intracranial hemorrhage. There is moderate age-related atrophy and chronic microvascular ischemic changes. There is no mass effect or  midline shift. There is mild mucoperiosteal thickening of the paranasal sinuses. No air-fluid level. The mastoid air cells are clear. The calvarium is intact. IMPRESSION: No acute intracranial hemorrhage. Age-related atrophy and chronic microvascular ischemic disease. If symptoms persist and there are no contraindications, MRI may provide better evaluation if clinically indicated. Electronically Signed   By: Anner Crete M.D.   On: 11/23/2015 23:11   Dg Hip Unilat With Pelvis 2-3 Views Left  11/23/2015  CLINICAL DATA:  Pt c/o left anterior abd pain inguinally s/p fall today, pt seems confused, also c/o weakness. EXAM: DG HIP (WITH OR WITHOUT PELVIS) 2-3V LEFT COMPARISON:  None. FINDINGS: No fracture.  No bone lesion. Hip joints, SI joints and symphysis pubis are normally spaced and aligned. Bones are demineralized. Soft tissues are unremarkable other than vascular calcifications. IMPRESSION: No fracture or acute finding. Electronically Signed   By: Lajean Manes M.D.   On: 11/23/2015 23:30    Chart has been reviewed  Family   at  Bedside  plan of care was discussed with   Daughter Hilma Favors [phone] cell  Assessment/Plan  82 year old female with history of hypertension presents with diminished by mouth intake resulting  in dehydration with evidence of UTI as well as acute encephalopathy possible some short-term memory loss and transient left leg weakness currently appears to have resolved  Present on Admission:  . UTI treated with Rocephin await results of urine culture  . Essential hypertension stable continue home medications  . Acute encephalopathy family's concern is patient had a stroke with an MRI. Currently appears to be neurologically intact but her family has been having left-sided weakness  . Hypokalemia replace  . Dehydration administer IV fluids check orthostatics prior to discharge her physical therapy evaluate prior to discharge   Prophylaxis: SCD   CODE  STATUS:  FULL CODE  as per patient    Disposition: likely will need placement for rehabilitation                      Other plan as per orders.  I have spent a total of 57 min on this admission    Kahlen Morais 11/24/2015, 3:58 AM    [facility]  Pager [phone]   after 2 AM please page floor coverage PA If 7AM-7PM, please contact the day team taking care of the patient  [email or web address]  Password [email or web address]

## 2015-11-24 NOTE — ED Notes (Signed)
Attempt to collecte lab was unable. Main lab have been contact

## 2015-11-24 NOTE — Progress Notes (Signed)
PT Note:  PT eval attempted in ED.  Pt refusing participation at this time stating "I am too tired, I have not slept all night, come back tomorrow".  RN advises pt to be transferred to telemetry floor.  Will follow.

## 2015-11-24 NOTE — ED Notes (Signed)
Admitting physician made aware of pt's elevated troponin.

## 2015-11-24 NOTE — ED Notes (Signed)
When rounding on pt, pt reported to this RN that she was on a bedpan and had been calling for someone to take her off. Informed pt that we were not told that she was on a bedpan and asked her who put her on the bedpan. Pt reported that a doctor had placed her on the bedpan. Admitting physican had rounded on the pt approx 40-50 minutes ago and never informed staff that he had placed pt on the bedpan. Pt's buttocks is red and pt was saturated with urine. Pt cleaned and fresh brief placed on pt.

## 2015-11-24 NOTE — Telephone Encounter (Signed)
Pt daughter is calling to let md know he mother is still at [facility] awaiting a bed. Dr Raliegh Ip please call her daughter Hilda Blades for update.

## 2015-11-25 ENCOUNTER — Observation Stay (HOSPITAL_COMMUNITY): Payer: Medicare Other

## 2015-11-25 ENCOUNTER — Ambulatory Visit: Payer: Medicare Other | Admitting: Internal Medicine

## 2015-11-25 ENCOUNTER — Observation Stay (HOSPITAL_BASED_OUTPATIENT_CLINIC_OR_DEPARTMENT_OTHER): Payer: Medicare Other

## 2015-11-25 DIAGNOSIS — E785 Hyperlipidemia, unspecified: Secondary | ICD-10-CM | POA: Diagnosis present

## 2015-11-25 DIAGNOSIS — E876 Hypokalemia: Secondary | ICD-10-CM | POA: Diagnosis present

## 2015-11-25 DIAGNOSIS — I422 Other hypertrophic cardiomyopathy: Secondary | ICD-10-CM | POA: Diagnosis present

## 2015-11-25 DIAGNOSIS — Z9049 Acquired absence of other specified parts of digestive tract: Secondary | ICD-10-CM | POA: Diagnosis not present

## 2015-11-25 DIAGNOSIS — N12 Tubulo-interstitial nephritis, not specified as acute or chronic: Secondary | ICD-10-CM | POA: Diagnosis present

## 2015-11-25 DIAGNOSIS — I714 Abdominal aortic aneurysm, without rupture: Secondary | ICD-10-CM | POA: Diagnosis present

## 2015-11-25 DIAGNOSIS — N390 Urinary tract infection, site not specified: Secondary | ICD-10-CM | POA: Diagnosis not present

## 2015-11-25 DIAGNOSIS — I421 Obstructive hypertrophic cardiomyopathy: Secondary | ICD-10-CM | POA: Diagnosis not present

## 2015-11-25 DIAGNOSIS — E039 Hypothyroidism, unspecified: Secondary | ICD-10-CM | POA: Diagnosis present

## 2015-11-25 DIAGNOSIS — R410 Disorientation, unspecified: Secondary | ICD-10-CM | POA: Diagnosis not present

## 2015-11-25 DIAGNOSIS — Z720 Tobacco use: Secondary | ICD-10-CM | POA: Diagnosis not present

## 2015-11-25 DIAGNOSIS — Z886 Allergy status to analgesic agent status: Secondary | ICD-10-CM | POA: Diagnosis not present

## 2015-11-25 DIAGNOSIS — J189 Pneumonia, unspecified organism: Secondary | ICD-10-CM | POA: Diagnosis not present

## 2015-11-25 DIAGNOSIS — E860 Dehydration: Secondary | ICD-10-CM | POA: Diagnosis not present

## 2015-11-25 DIAGNOSIS — J9600 Acute respiratory failure, unspecified whether with hypoxia or hypercapnia: Secondary | ICD-10-CM | POA: Diagnosis not present

## 2015-11-25 DIAGNOSIS — R29898 Other symptoms and signs involving the musculoskeletal system: Secondary | ICD-10-CM

## 2015-11-25 DIAGNOSIS — E440 Moderate protein-calorie malnutrition: Secondary | ICD-10-CM | POA: Diagnosis not present

## 2015-11-25 DIAGNOSIS — J9601 Acute respiratory failure with hypoxia: Secondary | ICD-10-CM | POA: Diagnosis not present

## 2015-11-25 DIAGNOSIS — M5126 Other intervertebral disc displacement, lumbar region: Secondary | ICD-10-CM | POA: Diagnosis not present

## 2015-11-25 DIAGNOSIS — J42 Unspecified chronic bronchitis: Secondary | ICD-10-CM | POA: Diagnosis present

## 2015-11-25 DIAGNOSIS — J439 Emphysema, unspecified: Secondary | ICD-10-CM | POA: Diagnosis present

## 2015-11-25 DIAGNOSIS — R2681 Unsteadiness on feet: Secondary | ICD-10-CM | POA: Diagnosis present

## 2015-11-25 DIAGNOSIS — K219 Gastro-esophageal reflux disease without esophagitis: Secondary | ICD-10-CM | POA: Diagnosis present

## 2015-11-25 DIAGNOSIS — G9340 Encephalopathy, unspecified: Secondary | ICD-10-CM | POA: Diagnosis not present

## 2015-11-25 DIAGNOSIS — Z6828 Body mass index (BMI) 28.0-28.9, adult: Secondary | ICD-10-CM | POA: Diagnosis not present

## 2015-11-25 DIAGNOSIS — R0902 Hypoxemia: Secondary | ICD-10-CM | POA: Diagnosis not present

## 2015-11-25 DIAGNOSIS — J188 Other pneumonia, unspecified organism: Secondary | ICD-10-CM | POA: Diagnosis not present

## 2015-11-25 DIAGNOSIS — G459 Transient cerebral ischemic attack, unspecified: Secondary | ICD-10-CM | POA: Diagnosis not present

## 2015-11-25 DIAGNOSIS — F0390 Unspecified dementia without behavioral disturbance: Secondary | ICD-10-CM | POA: Diagnosis present

## 2015-11-25 DIAGNOSIS — I10 Essential (primary) hypertension: Secondary | ICD-10-CM | POA: Diagnosis present

## 2015-11-25 DIAGNOSIS — M179 Osteoarthritis of knee, unspecified: Secondary | ICD-10-CM | POA: Diagnosis not present

## 2015-11-25 DIAGNOSIS — F329 Major depressive disorder, single episode, unspecified: Secondary | ICD-10-CM | POA: Diagnosis present

## 2015-11-25 DIAGNOSIS — Z87891 Personal history of nicotine dependence: Secondary | ICD-10-CM | POA: Diagnosis not present

## 2015-11-25 DIAGNOSIS — R531 Weakness: Secondary | ICD-10-CM | POA: Diagnosis not present

## 2015-11-25 LAB — URINE CULTURE

## 2015-11-25 LAB — HEMOGLOBIN A1C
Hgb A1c MFr Bld: 5.8 % — ABNORMAL HIGH (ref 4.8–5.6)
Mean Plasma Glucose: 120 mg/dL

## 2015-11-25 MED ORDER — ENSURE ENLIVE PO LIQD
237.0000 mL | Freq: Two times a day (BID) | ORAL | Status: DC
  Administered 2015-11-25 – 2015-11-27 (×3): 237 mL via ORAL

## 2015-11-25 MED ORDER — FUROSEMIDE 10 MG/ML IJ SOLN
20.0000 mg | Freq: Once | INTRAMUSCULAR | Status: AC
  Administered 2015-11-25: 20 mg via INTRAVENOUS
  Filled 2015-11-25: qty 2

## 2015-11-25 NOTE — Evaluation (Signed)
Physical Therapy Evaluation Patient Details Name: Raashida Morando MRN: [id number] DOB: December 02, 1929 Today's Date: 11/25/2015   History of Present Illness  Wava Cuadrado is a 82 y.o. femalewith past medical history of GERD (gastroesophageal reflux disease); Hyperlipidemia; Hypertension; Hypothyroidism; H/O: hematuria (07/2010); and PONV (postoperative nausea and vomiting) and admitted with UTI, acute encephalopathy     Clinical Impression  Pt admitted with above diagnosis. Pt currently with functional limitations due to the deficits listed below (see PT Problem List).  Pt will benefit from skilled PT to increase their independence and safety with mobility to allow discharge to the venue listed below.   Pt assisted OOB to use BSC and then over to recliner over declined ambulation (wanted breakfast).  Pt appears confused and unable to provide history.     Follow Up Recommendations Home health PT;Supervision/Assistance - 24 hour    Equipment Recommendations  None recommended by PT    Recommendations for Other Services       Precautions / Restrictions Precautions Precautions: Fall Precaution Comments: monitor sats      Mobility  Bed Mobility Overal bed mobility: Needs Assistance Bed Mobility: Supine to Sit     Supine to sit: Supervision     General bed mobility comments: cues for technique  Transfers Overall transfer level: Needs assistance Equipment used: None Transfers: Sit to/from Stand;Stand Pivot Transfers Sit to Stand: Min assist Stand pivot transfers: Min assist       General transfer comment: pt assist for steadying, upon standing needed to urinate (linen already wet), brought over BSC, pt declined ambulating so assisted to recliner, SpO2 82% room air after transfers so reapplied 2L O2 [place]  Ambulation/Gait                Stairs            Wheelchair Mobility    Modified Rankin (Stroke Patients Only)       Balance                                              Pertinent Vitals/Pain Pain Assessment: No/denies pain    Home Living Family/patient expects to be discharged to:: Unsure Living Arrangements: Alone                    Prior Function Level of Independence: Needs assistance         Comments: pt unable to report but chart indicates pt has been staying with family member who has been assisting her with mobility/ADLs     Hand Dominance   Dominant Hand: Right    Extremity/Trunk Assessment   Upper Extremity Assessment: Generalized weakness           Lower Extremity Assessment: Generalized weakness      Cervical / Trunk Assessment: Normal  Communication   Communication: No difficulties  Cognition Arousal/Alertness: Awake/alert Behavior During Therapy: Flat affect Overall Cognitive Status: No family/caregiver present to determine baseline cognitive functioning       Memory: Decreased short-term memory (slow processing of information/commands)              General Comments      Exercises        Assessment/Plan    PT Assessment Patient needs continued PT services  PT Diagnosis Difficulty walking   PT Problem List Decreased strength;Decreased activity tolerance;Decreased mobility;Decreased knowledge of use of DME;Decreased  cognition;Decreased safety awareness;Decreased balance  PT Treatment Interventions DME instruction;Gait training;Functional mobility training;Patient/family education;Therapeutic activities;Therapeutic exercise;Balance training   PT Goals (Current goals can be found in the Care Plan section) Acute Rehab PT Goals Patient Stated Goal: none stated PT Goal Formulation: With patient Time For Goal Achievement: 12/09/15 Potential to Achieve Goals: Good    Frequency Min 3X/week   Barriers to discharge        Co-evaluation               End of Session Equipment Utilized During Treatment: Gait belt;Oxygen Activity Tolerance: Patient  tolerated treatment well Patient left: in chair;with call bell/phone within reach;with chair alarm set Nurse Communication: Mobility status    Functional Assessment Tool Used: clinical judgement Functional Limitation: Mobility: Walking and moving around Mobility: Walking and Moving Around Current Status [id number]): At least 20 percent but less than 40 percent impaired, limited or restricted Mobility: Walking and Moving Around Goal Status [id number]): At least 1 percent but less than 20 percent impaired, limited or restricted    Time: 1030-1045 PT Time Calculation (min) (ACUTE ONLY): 15 min   Charges:   PT Evaluation $PT Eval Low Complexity: 1 Procedure     PT G Codes:   PT G-Codes **NOT FOR INPATIENT CLASS** Functional Assessment Tool Used: clinical judgement Functional Limitation: Mobility: Walking and moving around Mobility: Walking and Moving Around Current Status [id number]): At least 20 percent but less than 40 percent impaired, limited or restricted Mobility: Walking and Moving Around Goal Status [phone]): At least 1 percent but less than 20 percent impaired, limited or restricted    Squire Withey,KATHrine E 11/25/2015, 1:42 PM Carmelia Bake, PT, DPT 11/25/2015 Pager: [phone]

## 2015-11-25 NOTE — Progress Notes (Signed)
*  PRELIMINARY RESULTS* Vascular Ultrasound Carotid Duplex has been completed.  Preliminary findings: Bilateral:  1-39% ICA stenosis.  Left >50% ECA stenosis. Vertebral artery flow is antegrade.       Landry Mellow, RDMS, RVT  11/25/2015, 2:11 PM

## 2015-11-25 NOTE — Progress Notes (Signed)
Initial Nutrition Assessment  DOCUMENTATION CODES:   Non-severe (moderate) malnutrition in context of chronic illness  INTERVENTION:  - Will order Ensure Enlive po BID, each supplement provides 350 kcal and 20 grams of protein - Encourage PO intakes of meals and supplements - RD will continue to monitor for needs  NUTRITION DIAGNOSIS:   Inadequate oral intake related to poor appetite as evidenced by per patient/family report, meal completion < 25%.  GOAL:   Patient will meet greater than or equal to 90% of their needs  MONITOR:   PO intake, Supplement acceptance, Weight trends, Labs, I & O's  REASON FOR ASSESSMENT:   Malnutrition Screening Tool  ASSESSMENT:   82 y.o. female has a past medical history of GERD (gastroesophageal reflux disease); Hyperlipidemia; Hypertension; Hypothyroidism; H/O: hematuria (07/2010); and PONV (postoperative nausea and vomiting). Presented with 1 week history of increasing cough fatigue unsteady gait decrease in by mouth intake she's been more confused than usual and worsening fatigue. Apparently cannot lift left leg and stating she is in pain. She's been also weak overall is well. Daughter is concerned that she's been having short-term memory loss no facial droop was noted no slurred speech. She had an episode where she slid down on the floor but did not hit her head she has been having some suprapubic discomfort.  Pt seen for MST. BMI indicates overweight status. No intakes documented and visualized lunch tray at time of visit with 2-3 bites taken. Pt states she ate breakfast but is unable to recall what she had to eat or how much she ate. She denies abdominal pain or nausea currently but states she is full and does not feel she will be able to eat any more of lunch. Pt states that PTA she was a light eater and never eats much at any given meal. Responses to all questions were mainly 1-2 word answers or head shakes/nods. Noted that pt presented with  short-term memory loss and no family/visitors were present during RD assessment.   Physical assessment indicates mild to moderate muscle and mild to moderate fat wasting, no edema. Per chart review, pt has gained 14 lbs in the past 7 days in addition to a total of 22 lbs gained in the past 6 months; will continue to monitor weight trends and question the severity of weight gain over the past 1 week.  Pt not meeting needs. Will order Ensure to supplement and monitor for need for adjustment. Pt would benefit from encouragement during meal times and with supplements. Medications reviewed. Labs reviewed; K: 3.4 mmol/L, Ca: 8.3 mg/dL, Phos: 2.1 mg/dL.   Diet Order:  Diet Heart Room service appropriate?: Yes; Fluid consistency:: Thin  Skin:  Reviewed, no issues  Last BM:  PTA  Height:   Ht Readings from Last 1 Encounters:  11/24/15 5\' 6"  (1.676 m)    Weight:   Wt Readings from Last 1 Encounters:  11/24/15 174 lb 9.6 oz (79.198 kg)    Ideal Body Weight:  59.09 kg (kg)  BMI:  Body mass index is 28.19 kg/(m^2).  Estimated Nutritional Needs:   Kcal:  1400-1600  Protein:  50-60 grams  Fluid:  >/= 1.8 L/day  EDUCATION NEEDS:   No education needs identified at this time     Jarome Matin, RD, LDN Inpatient Clinical Dietitian Pager # [phone] After hours/weekend pager # [phone]

## 2015-11-25 NOTE — Progress Notes (Signed)
  PROGRESS NOTE  Dawn Woods [id number] DOB: 02/28/1930 DOA: 11/23/2015 PCP: [name], MD  Summary: 82 year old woman recently returned from an [place] of state wedding, seen by PCP for generalized weakness and diagnosed with volume contraction. Presented to the emergency department 2/27 with ongoing complaints of generalized weakness, left leg weakness, possible short-term memory loss. Admitted for UTI, acute encephalopathy with nonfocal exam  Assessment/Plan: 1. Progressive generalized weakness, fatigue; appears resolved, suspect pyelonephritis/UTI. Exam nonfocal. Reportedly worked well with PT. 2. Acute encephalopathy superimposed on dementia. Appears to be at baseline. Recalls going to [facility]. 3. Reported left leg weakness, ataxic gait. MRI brain negative for acute process. Exam benign. Reports fall somewhat recently. No deficit on exam. Left hip and lumbar spine films negative. 4. Elevated troponin. Spurious. Initial troponin was negative, follow-up troponin also negative.  Allergic to ASA. No chest pain. EKG sinus rhythm, PVC, borderline prolonged QT.   5. UTI, pyelonephritis appears resolved clinically. 6. Hypothyroidism. TSH 16.82. Increase Synthroid. Followup as outaptient. 7. 3.2 cm infrarenal abdominal aortic aneurysm. Recommend follow up by ultrasound in 3 years.   Appears to be at baseline. No focal deficit. MRI negative. Fu carotid u/s and echo. Exam and further hx does not suggest TIA. No pain, no pain previously per daughter, just difficulty using. Check MRI lumbar spine.  Continue empiric antibiotics for pyelo/UTI  Wean oxygen, doubt will need.  Code Status: full code DVT prophylaxis: SCDs Family Communication: none present Disposition Plan: likely home 2/29  [name], MD  [facility]  Pager [phone] If 7PM-7AM, please contact night-coverage at [email or web address], password [facility] 11/25/2015, 12:17 PM     Consultants:    Procedures:    Antibiotics:    HPI/Subjective: No complaints. No back or leg pain, no groin or hip pain. Per RN ambulated yesterday and today without pain. Per RN did well with PT>  Objective: Filed Vitals:   11/24/15 2000 11/24/15 2010 11/24/15 2126 11/25/15 0640  BP:   123/65 168/80  Pulse:   76 71  Temp:   97.5 F (36.4 C) 98.1 F (36.7 C)  TempSrc:   Oral Oral  Resp:   22 20  Height:      Weight:      SpO2: 91% 93% 95% 95%   No intake or output data in the 24 hours ending 11/25/15 1217   Filed Weights   11/24/15 1513  Weight: 79.198 kg (174 lb 9.6 oz)    Exam:    General:  Appears calm and comfortable Respiratory: CTA bilaterally, no w/r/r. Normal respiratory effort. Musculoskeletal: grossly normal tone BUE/BLE, moves all extremities to command, strength appears symmetric both legs Psychiatric: grossly normal mood and affect, speech fluent and appropriate  New data reviewed  Troponins negative    Scheduled Meds: . cefTRIAXone (ROCEPHIN)  IV  1 g Intravenous Q24H  . levothyroxine  88 mcg Oral QAC breakfast  . pantoprazole  40 mg Oral BID  . simvastatin  10 mg Oral QHS  . sodium chloride flush  3 mL Intravenous Q12H   Continuous Infusions:    Principal Problem:   UTI Active Problems:   Essential hypertension   Tobacco abuse   Acute encephalopathy   Hypokalemia   Dehydration   Generalized weakness   UTI (lower urinary tract infection)   Time spent 20 minutes

## 2015-11-25 NOTE — Progress Notes (Signed)
Occupational Therapy Evaluation Patient Details Name: Dawn Woods MRN: [id number] DOB: 17-May-1930 Today's Date: 11/25/2015    History of Present Illness Dawn Woods is a 82 y.o. femalewith past medical history of GERD (gastroesophageal reflux disease); Hyperlipidemia; Hypertension; Hypothyroidism; H/O: hematuria (07/2010); and PONV (postoperative nausea and vomiting). Presented with 1 week history of increasing cough fatigue unsteady gait decrease in by mouth intake she's been more confused than usual.       Clinical Impression   Patient presents to OT with decreased ADL independence and safety. Will benefit from skilled OT to maximize function and to facilitate a safe discharge. OT will follow.    Follow Up Recommendations  Supervision/Assistance - 24 hour (if 24/7 available, recommend HHOT. If 24/7 not available, recommend SNF)   Equipment Recommendations  3 in 1 bedside comode    Recommendations for Other Services       Precautions / Restrictions Precautions Precautions: Fall      Mobility Bed Mobility               General bed mobility comments: up in recliner  Transfers Overall transfer level: Needs assistance Equipment used: None Transfers: Sit to/from [facility] Sit to Stand: Min assist Stand pivot transfers: Min assist       General transfer comment: assistance to steady    Balance                                            ADL Overall ADL's : Needs assistance/impaired Eating/Feeding: Independent   Grooming: Wash/dry hands;Wash/dry face;Set up;Sitting   Upper Body Bathing: Minimal assitance;Sitting   Lower Body Bathing: Minimal assistance;Sit to/from stand   Upper Body Dressing : Minimal assistance;Sitting   Lower Body Dressing: Minimal assistance;Sit to/from stand   Toilet Transfer: Minimal assistance;Stand-pivot;BSC             General ADL Comments: Patient up in chair, confused during  conversation with slow processing noted. Patient oriented to hospital, year, month with cues, not to situation. Unable to report PLOF. Assessed most in chair, pt stood and simulated stand pivot transfer. Refused to ambulate. Refused further activity. Note pt breathing harder with activity but she denies SOB. OT will follow.     Vision     Perception     Praxis      Pertinent Vitals/Pain Pain Assessment: No/denies pain     Hand Dominance Right   Extremity/Trunk Assessment Upper Extremity Assessment Upper Extremity Assessment: Generalized weakness   Lower Extremity Assessment Lower Extremity Assessment: Defer to PT evaluation       Communication Communication Communication: No difficulties   Cognition Arousal/Alertness: Awake/alert Behavior During Therapy: Flat affect Overall Cognitive Status: No family/caregiver present to determine baseline cognitive functioning       Memory: Decreased short-term memory (slow processing of information/commands)             General Comments       Exercises       Shoulder Instructions      Home Living Family/patient expects to be discharged to:: Unsure Living Arrangements: Alone                                      Prior Functioning/Environment Level of Independence: Needs assistance        Comments:  pt unable to report but chart indicates pt has been staying with family member who has been assisting her with mobility/ADLs    OT Diagnosis: Generalized weakness;Cognitive deficits   OT Problem List: Decreased activity tolerance;Impaired balance (sitting and/or standing);Decreased safety awareness;Decreased knowledge of use of DME or AE   OT Treatment/Interventions: Self-care/ADL training;DME and/or AE instruction;Therapeutic activities;Patient/family education    OT Goals(Current goals can be found in the care plan section) Acute Rehab OT Goals Patient Stated Goal: none stated OT Goal Formulation:  Patient unable to participate in goal setting Time For Goal Achievement: 12/09/15 Potential to Achieve Goals: Good ADL Goals Pt Will Perform Upper Body Bathing: with supervision Pt Will Perform Lower Body Bathing: with supervision Pt Will Perform Upper Body Dressing: with supervision Pt Will Perform Lower Body Dressing: with supervision Pt Will Transfer to Toilet: with supervision Pt Will Perform Toileting - Clothing Manipulation and hygiene: with supervision Pt Will Perform Tub/Shower Transfer: with supervision  OT Frequency: Min 2X/week   Barriers to D/C:            Co-evaluation              End of Session Nurse Communication: Mobility status  Activity Tolerance: Patient tolerated treatment well Patient left: in chair;with call bell/phone within reach;with bed alarm set   Time: [id number] OT Time Calculation (min): 12 min Charges:  OT General Charges $OT Visit: 1 Procedure OT Evaluation $OT Eval Moderate Complexity: 1 Procedure G-Codes: OT G-codes **NOT FOR INPATIENT CLASS** Functional Assessment Tool Used: clinical judgment Functional Limitation: Self care Self Care Current Status [id number]): At least 40 percent but less than 60 percent impaired, limited or restricted Self Care Goal Status [id number]): At least 1 percent but less than 20 percent impaired, limited or restricted  Dawn Woods A 11/25/2015, 12:22 PM

## 2015-11-25 NOTE — Care Management Obs Status (Signed)
[place] NOTIFICATION   Patient Details  Name: Dawn Woods MRN: [id number] Date of Birth: 10/13/1929   Medicare Observation Status Notification Given:  Yes    MahabirJuliann Pulse, RN 11/25/2015, 4:08 PM

## 2015-11-26 ENCOUNTER — Inpatient Hospital Stay (HOSPITAL_COMMUNITY): Payer: Medicare Other

## 2015-11-26 DIAGNOSIS — R0902 Hypoxemia: Secondary | ICD-10-CM

## 2015-11-26 DIAGNOSIS — Z720 Tobacco use: Secondary | ICD-10-CM

## 2015-11-26 DIAGNOSIS — G459 Transient cerebral ischemic attack, unspecified: Secondary | ICD-10-CM

## 2015-11-26 DIAGNOSIS — J9601 Acute respiratory failure with hypoxia: Secondary | ICD-10-CM

## 2015-11-26 DIAGNOSIS — E440 Moderate protein-calorie malnutrition: Secondary | ICD-10-CM | POA: Insufficient documentation

## 2015-11-26 MED ORDER — AZITHROMYCIN 250 MG PO TABS
250.0000 mg | ORAL_TABLET | Freq: Every day | ORAL | Status: DC
  Administered 2015-11-27 – 2015-11-28 (×2): 250 mg via ORAL
  Filled 2015-11-26 (×2): qty 1

## 2015-11-26 MED ORDER — IOHEXOL 350 MG/ML SOLN
100.0000 mL | Freq: Once | INTRAVENOUS | Status: AC | PRN
  Administered 2015-11-26: 100 mL via INTRAVENOUS

## 2015-11-26 MED ORDER — AZITHROMYCIN 250 MG PO TABS
500.0000 mg | ORAL_TABLET | Freq: Every day | ORAL | Status: AC
  Administered 2015-11-26: 500 mg via ORAL
  Filled 2015-11-26: qty 2

## 2015-11-26 NOTE — Progress Notes (Signed)
  Echocardiogram 2D Echocardiogram has been performed.  Aydin Hink 11/26/2015, 11:16 AM

## 2015-11-26 NOTE — Progress Notes (Signed)
*  PRELIMINARY RESULTS* Vascular Ultrasound Lower extremity venous duplex has been completed.  Preliminary findings: No evidence of DVT or baker's cyst.   Landry Mellow, RDMS, RVT  11/26/2015, 1:06 PM

## 2015-11-26 NOTE — NC FL2 (Signed)
[place] LEVEL OF CARE SCREENING TOOL     IDENTIFICATION  Patient Name: Dawn Woods Birthdate: 05-06-1930 Sex: female Admission Date (Current Location): 11/23/2015  [facility] and [place] Number:  Herbalist and Address:  [facility],  [place], [place]      Provider Number: [id number]  Attending Physician Name and Address:  [name], MD  Relative Name and Phone Number:       Current Level of Care: Hospital Recommended Level of Care: [place] Prior Approval Number:    Date Approved/Denied:   PASRR Number: [id number]  Discharge Plan: SNF    Current Diagnoses: Patient Active Problem List   Diagnosis Date Noted  . Acute encephalopathy 11/24/2015  . Hypokalemia 11/24/2015  . Dehydration 11/24/2015  . Generalized weakness 11/24/2015  . UTI (lower urinary tract infection)   . BPV (benign positional vertigo) 08/25/2014  . Melena 08/05/2013  . Personal history of colonic polyps 08/05/2013  . Tobacco abuse 01/04/2012  . UTI 05/19/2010  . Hypothyroidism 04/21/2008  . Dyslipidemia 04/21/2008  . Essential hypertension 04/21/2008  . GERD 04/21/2008    Orientation RESPIRATION BLADDER Height & Weight     Self, Time, Place, Situation  O2 (2L O2) Continent Weight: 174 lb 9.6 oz (79.198 kg) Height:  5\' 6"  (167.6 cm)  BEHAVIORAL SYMPTOMS/MOOD NEUROLOGICAL BOWEL NUTRITION STATUS   (no behaviors)  (NONE) Continent Diet (Diet Heart)  AMBULATORY STATUS COMMUNICATION OF NEEDS Skin   Extensive Assist (+1 to +2 assist with ambulation) Verbally Normal                       Personal Care Assistance Level of Assistance  Bathing, Feeding, Dressing Bathing Assistance: Limited assistance Feeding assistance: Independent Dressing Assistance: Limited assistance     Functional Limitations Info  Sight, Hearing, Speech Sight Info: Adequate Hearing Info: Adequate Speech Info: Adequate    SPECIAL  CARE FACTORS FREQUENCY  PT (By licensed PT), OT (By licensed OT)     PT Frequency: 5 x a week OT Frequency: 5 x a week            Contractures Contractures Info: Not present    Additional Factors Info  Code Status, Allergies Code Status Info: DNR code status Allergies Info: Aspirin, Ibuprofen, Ciprofloxacin           Current Medications (11/26/2015):  This is the current hospital active medication list Current Facility-Administered Medications  Medication Dose Route Frequency Provider Last Rate Last Dose  . acetaminophen (TYLENOL) tablet 650 mg  650 mg Oral Q6H PRN Toy Baker, MD   650 mg at 11/24/15 1842   Or  . acetaminophen (TYLENOL) suppository 650 mg  650 mg Rectal Q6H PRN Toy Baker, MD      . cefTRIAXone (ROCEPHIN) 1 g in dextrose 5 % 50 mL IVPB  1 g Intravenous Q24H Toy Baker, MD   1 g at 11/25/15 2306  . feeding supplement (ENSURE ENLIVE) (ENSURE ENLIVE) liquid 237 mL  237 mL Oral BID BM [name], MD   237 mL at 11/26/15 1010  . hydrALAZINE (APRESOLINE) injection 5 mg  5 mg Intravenous Q4H PRN Gardiner Barefoot, NP   5 mg at 11/24/15 [id number]  . HYDROcodone-acetaminophen (NORCO/VICODIN) 5-325 MG per tablet 1-2 tablet  1-2 tablet Oral Q4H PRN Toy Baker, MD      . levothyroxine (SYNTHROID, LEVOTHROID) tablet 88 mcg  88 mcg Oral QAC breakfast Anastassia Doutova,  MD   88 mcg at 11/26/15 0829  . meclizine (ANTIVERT) tablet 25 mg  25 mg Oral TID PRN Toy Baker, MD      . ondansetron (ZOFRAN) tablet 4 mg  4 mg Oral Q6H PRN Toy Baker, MD       Or  . ondansetron (ZOFRAN) injection 4 mg  4 mg Intravenous Q6H PRN Toy Baker, MD      . pantoprazole (PROTONIX) EC tablet 40 mg  40 mg Oral BID Toy Baker, MD   40 mg at 11/26/15 1010  . polyethylene glycol (MIRALAX / GLYCOLAX) packet 17 g  17 g Oral Daily PRN Toy Baker, MD      . simvastatin (ZOCOR) tablet 10 mg  10 mg Oral QHS Toy Baker, MD    10 mg at 11/25/15 2152  . sodium chloride flush (NS) 0.9 % injection 3 mL  3 mL Intravenous Q12H Toy Baker, MD   3 mL at 11/26/15 1010     Discharge Medications: Please see discharge summary for a list of discharge medications.  Relevant Imaging Results:  Relevant Lab Results:   Additional Information SSN: [id number]  Kallen Mccrystal, [place] Better A, LCSW

## 2015-11-26 NOTE — Progress Notes (Signed)
Daughter Jackelyn Poling called requesting update on mother. Daughter provided this update. Daughter requesting to speak with Case Freight forwarder and Education officer, museum. I will relay this message to both team members.

## 2015-11-26 NOTE — Progress Notes (Signed)
Nursing Note: Arrived via w/c.Awake ,Alert,Oxygen @2L  n/c.Call bell in reach, no complaints.wbb

## 2015-11-26 NOTE — Care Management Note (Signed)
Case Management Note  Patient Details  Name: Dawn Woods MRN: [id number] Date of Birth: 01-06-1930  Subjective/Objective:  Spoke to dtr on phone about OBSERVATION Status form that patient signed yesterday.Now patient is inpatient & dtr understands status changes.  Informed dtr about PT recc-HHPT/24hr supv-answered all questions-left HHC/private duty care agency list(independant-out of pocket expense) in rm.dtr voiced understanding.  CSW also following for SNF.                  Action/Plan:d/c HH vs SNF.   Expected Discharge Date:   (unknown)               Expected Discharge Plan:  [place]  In-House Referral:  Clinical Social Work  Discharge planning Services  CM Consult  Post Acute Care Choice:    Choice offered to:  Adult Children  DME Arranged:    DME Agency:     HH Arranged:    HH Agency:     Status of Service:  In process, will continue to follow  Medicare Important Message Given:    Date Medicare IM Given:    Medicare IM give by:    Date Additional Medicare IM Given:    Additional Medicare Important Message give by:     If discussed at [place] of Stay Meetings, dates discussed:    Additional Comments:  Dessa Phi, RN 11/26/2015, 12:08 PM

## 2015-11-26 NOTE — Progress Notes (Signed)
PROGRESS NOTE  Dawn Woods [id number] DOB: Jun 30, 1930 DOA: 11/23/2015 PCP: [name], MD  Summary: 82 year old woman recently returned from an [place] of [place] wedding, seen by PCP for generalized weakness and diagnosed with volume contraction. Presented to the emergency department 2/27 with ongoing complaints of generalized weakness, left leg weakness, possible short-term memory loss. Admitted for UTI, acute encephalopathy with nonfocal exam  Assessment/Plan: 1. Acute hypoxic respiratory failure. Repeat chest x-ray 3/2 chronic bronchitic changes. No focal acute abnormality. Long history of smoking with abstinence the last 2-3 years. No previous history of oxygen requirement although history is limited. 2. Reported left leg weakness with ataxic gait. MRI brain negative, left hip and lumbar spine films negative. No pain. No weakness on examination. Lumbar spine MRI unremarkable. Significance of reported outpatient weakness unclear. 3. UTI, pyelonephritis. Appears resolved clinically. Afebrile. Urine culture unrevealing. 4. Acute encephalopathy superimposed on dementia. Acute component resolved. 5. Asymptomatic hypertrophic cardiomyopathy, new finding since echocardiogram 08/06/2013. 6. Asymptomatic carotid stenosis. 7. Progressive generalized weakness, fatigue. Suspect related to infection. 8. Elevated troponin. Spurious. Initial, repeat troponins negative. No chest pain. EKG nonacute. 9. Hypothyroidism. TSH 16.82. Increase Synthroid. Followup as outaptient. 10. 3.2 cm infrarenal abdominal aortic aneurysm. Recommend follow up by ultrasound in 3 years.   Overall stable however continues to have an oxygen requirement for unclear reasons. Chest x-ray shows improvement with no evidence of acute process. Bilateral lower extremity venous Doppler is negative. Given her recent plane travel, will proceed with CT angiogram. Also consider simple sequela of long-standing smoking. Discussed  with the daughter in detail by telephone.  Code Status: full code DVT prophylaxis: SCDs Family Communication: none present Disposition Plan: likely home 2/29  [name], MD  [facility]  Pager [phone] If 7PM-7AM, please contact night-coverage at [email or web address], password [email or web address] [facility] 11/26/2015, 1:39 PM  LOS: 1 day   Consultants:  Occupational therapy recommending skilled skilled nursing facility  Procedures:  Echo Study Conclusions  - Left ventricle: The cavity size was normal. There was moderate  asymmetric hypertrophy of the septum. The appearance was  consistent with hypertrophic cardiomyopathy. Systolic function  was vigorous. The estimated ejection fraction was in the range of  65% to 70%. There was dynamic obstruction at restin the outflow  tract, with a peak velocity of 330 cm/sec and a peak gradient of  44 mm Hg. Wall motion was normal; there were no regional wall  motion abnormalities. Doppler parameters are consistent with  abnormal left ventricular relaxation (grade 1 diastolic  dysfunction). - Mitral valve: There was moderate systolic anterior motion of the  anterior leaflet. There was moderate regurgitation. - Pericardium, extracardiac: A small to moderate pericardial  effusion was identified anterior to the heart. There was no  evidence of hemodynamic compromise.  Impressions:  - Findings are consistent with hypertrophic obstructive  cardiomyopathy.   Bilateral carotid u/s Summary:  - The vertebral arteries appear patent with antegrade flow. - Findings consistent with 1- 39 percent stenosis involving the  right internal carotid artery and the left internal carotid  artery. - Findings consistent with >50% stenosis involving the left  external carotid artery.  Antibiotics:    HPI/Subjective: Significantly hypoxic on room air, symptomatic especially with ambulation.  No chest pain or shortness of breath. No leg pain, groin  pain, swelling. No history of blood clots. No swelling. Objective: Filed Vitals:   11/25/15 1347 11/25/15 2150 11/26/15 0032 11/26/15 0607  BP: 157/81 139/88 160/99 127/63  Pulse: 73 73 80 73  Temp: 97.7 F (36.5  C) 97.7 F (36.5 C) 97.7 F (36.5 C) 98.2 F (36.8 C)  TempSrc: Oral Oral Oral Oral  Resp: 18 18 18 18   Height:   5\' 6"  (1.676 m)   Weight:   79.198 kg (174 lb 9.6 oz)   SpO2: 93%  94% 93%    Intake/Output Summary (Last 24 hours) at 11/26/15 1339 Last data filed at 11/25/15 2306  Gross per 24 hour  Intake    100 ml  Output     75 ml  Net     25 ml     Filed Weights   11/24/15 1513 11/26/15 0032  Weight: 79.198 kg (174 lb 9.6 oz) 79.198 kg (174 lb 9.6 oz)    Exam:    General:  Appears comfortable, calm. Cardiovascular: Regular rate and rhythm, no murmur, rub or gallop. No lower extremity edema. Respiratory: Clear to auscultation bilaterally, no wheezes, rales or rhonchi. Normal respiratory effort. Musculoskeletal: grossly normal tone bilateral upper and lower extremities; strength and range of motion in the lower extremities appears symmetric. Grossly normal. Psychiatric: grossly normal mood and affect, speech fluent and appropriate Neurologic: grossly non-focal.  New data reviewed  Troponins negative   Bilateral lower extremity venous Doppler negative for DVT (preliminary)   Scheduled Meds: . cefTRIAXone (ROCEPHIN)  IV  1 g Intravenous Q24H  . feeding supplement (ENSURE ENLIVE)  237 mL Oral BID BM  . levothyroxine  88 mcg Oral QAC breakfast  . pantoprazole  40 mg Oral BID  . simvastatin  10 mg Oral QHS  . sodium chloride flush  3 mL Intravenous Q12H   Continuous Infusions:    Principal Problem:   Acute respiratory failure with hypoxia (HCC) Active Problems:   Essential hypertension   UTI   Tobacco abuse   Acute encephalopathy   Hypokalemia   Dehydration   Generalized weakness   UTI (lower urinary tract infection)   Malnutrition of  moderate degree   Time spent 20 minutes

## 2015-11-26 NOTE — Progress Notes (Signed)
Nurse Tech ambulated pt on room air per order. Pt noted to have O2 drop as low as 75% while ambulating. Pt currently at 88% on room air. Pt placed back on O2. Pt noted to have significant SHOB upon ambulation. Dr. Sarajane Jews paged and made aware.

## 2015-11-26 NOTE — Progress Notes (Signed)
Occupational Therapy Treatment Patient Details Name: Dawn Woods MRN: [id number] DOB: Jun 25, 1930 Today's Date: 11/26/2015    History of present illness Dawn Woods is a 82 y.o. femalewith past medical history of GERD (gastroesophageal reflux disease); Hyperlipidemia; Hypertension; Hypothyroidism; H/O: hematuria (07/2010); and PONV (postoperative nausea and vomiting) and admitted with UTI, acute encephalopathy      OT comments  Limited OT session today due to patient's unwillingness to get EOB/OOB with OT. OT will continue to follow.   Follow Up Recommendations  SNF;Supervision/Assistance - 24 hour    Equipment Recommendations  3 in 1 bedside comode    Recommendations for Other Services      Precautions / Restrictions Precautions Precautions: Fall Precaution Comments: monitor sats Restrictions Weight Bearing Restrictions: No       Mobility Bed Mobility                  Transfers                      Balance                                   ADL Overall ADL's : Needs assistance/impaired Eating/Feeding: Independent;Bed level   Grooming: Wash/dry hands;Wash/dry face;Oral care;Minimal assistance;Set up Grooming Details (indicate cue type and reason): setup for wash hands/face; min A oral care                             Functional mobility during ADLs:  (pt refused EOB/OOB this session) General ADL Comments: Patient received in bed, minimal verbalizations except adamantly declined EOB/OOB due to "tired and want to sleep." With max encouragement patient participated in bed-level grooming activities. Patient left in bed at end of session.      Vision                     Perception     Praxis      Cognition   Behavior During Therapy: Flat affect Overall Cognitive Status: No family/caregiver present to determine baseline cognitive functioning       Memory: Decreased short-term memory                Extremity/Trunk Assessment               Exercises     Shoulder Instructions       General Comments      Pertinent Vitals/ Pain       Pain Assessment: No/denies pain  Home Living                                          Prior Functioning/Environment              Frequency Min 2X/week     Progress Toward Goals  OT Goals(current goals can now be found in the care plan section)  Progress towards OT goals: Not progressing toward goals - comment (patient unwilling to participate in EOB/OOB activities today)  Acute Rehab OT Goals Patient Stated Goal: none stated  Plan Discharge plan needs to be updated    Co-evaluation                 End of Session     Activity Tolerance Patient limited  by fatigue   Patient Left in bed;with call bell/phone within reach;with bed alarm set   Nurse Communication          Time: [id number] OT Time Calculation (min): 10 min  Charges: OT General Charges $OT Visit: 1 Procedure OT Treatments $Self Care/Home Management : 8-22 mins  Tan Clopper A 11/26/2015, 12:15 PM

## 2015-11-27 ENCOUNTER — Ambulatory Visit: Payer: Medicare Other | Admitting: Internal Medicine

## 2015-11-27 DIAGNOSIS — I421 Obstructive hypertrophic cardiomyopathy: Secondary | ICD-10-CM

## 2015-11-27 DIAGNOSIS — E860 Dehydration: Secondary | ICD-10-CM

## 2015-11-27 DIAGNOSIS — R531 Weakness: Secondary | ICD-10-CM

## 2015-11-27 DIAGNOSIS — J9601 Acute respiratory failure with hypoxia: Secondary | ICD-10-CM

## 2015-11-27 DIAGNOSIS — E440 Moderate protein-calorie malnutrition: Secondary | ICD-10-CM

## 2015-11-27 MED ORDER — METOPROLOL TARTRATE 25 MG PO TABS: 12.5000 mg | ORAL_TABLET | Freq: Two times a day (BID) | ORAL | Status: DC

## 2015-11-27 MED ORDER — AZITHROMYCIN 250 MG PO TABS: 250.0000 mg | ORAL_TABLET | Freq: Every day | ORAL | Status: DC

## 2015-11-27 MED ORDER — ESCITALOPRAM OXALATE 5 MG PO TABS: 5.0000 mg | ORAL_TABLET | Freq: Every day | ORAL | Status: DC

## 2015-11-27 MED ORDER — CEFUROXIME AXETIL 500 MG PO TABS: 500.0000 mg | ORAL_TABLET | Freq: Two times a day (BID) | ORAL | Status: DC

## 2015-11-27 MED ORDER — METOPROLOL TARTRATE 25 MG PO TABS
12.5000 mg | ORAL_TABLET | Freq: Two times a day (BID) | ORAL | Status: DC
  Administered 2015-11-28: 12.5 mg via ORAL
  Filled 2015-11-27: qty 1

## 2015-11-27 MED ORDER — CEFUROXIME AXETIL 500 MG PO TABS
500.0000 mg | ORAL_TABLET | Freq: Two times a day (BID) | ORAL | Status: DC
  Administered 2015-11-28: 500 mg via ORAL
  Filled 2015-11-27 (×3): qty 1

## 2015-11-27 MED ORDER — LEVOTHYROXINE SODIUM 100 MCG PO TABS: 88.0000 ug | ORAL_TABLET | Freq: Every day | ORAL | Status: DC

## 2015-11-27 MED ORDER — ENSURE ENLIVE PO LIQD
237.0000 mL | Freq: Two times a day (BID) | ORAL | Status: DC
Start: 2015-11-27 — End: 2020-11-21

## 2015-11-27 MED ORDER — ESCITALOPRAM OXALATE 10 MG PO TABS
5.0000 mg | ORAL_TABLET | Freq: Every day | ORAL | Status: DC
  Filled 2015-11-27: qty 1

## 2015-11-27 MED ORDER — ACETAMINOPHEN 500 MG PO TABS: 500.0000 mg | ORAL_TABLET | Freq: Four times a day (QID) | ORAL | Status: DC | PRN

## 2015-11-27 NOTE — Consult Note (Signed)
CARDIOLOGY CONSULT NOTE   Patient ID: Dawn Woods MRN: [id number] DOB/AGE: 80-May-1931 80 y.o.  Admit date: 11/23/2015  Consulting Physician: Dr. Sarajane Jews Primary Physician   [name], MD Primary Cardiologist   New- Dr. Marlou Porch Reason for Consultation   HOCM noted on echo  HPI: Dawn Woods is a 82 y.o. female with a history of HTN, HLD, hypothyroidism, previous tobacco abuse, baseline dementia, carotid stenosis, and GERD who was admitted to [facility] on 11/23/15 with UTI/pyelonephritis and acute encephalopathy. 2D ECHO showed HOCM and cardiology was consulted.  She initially presented to the hospital with left left weakness and acute encephalopathy. MRI brain negative, left hip and lumbar spine films negative. No pain. No weakness on examination. Lumbar spine MRI unremarkable. SHe was found to have UTI and pyelonephritis and treated for this. Her TSH was noted to be elevated to ~17 and synthroid increased. She is feeling much better with no complaints. 2D ECHO on 11/26/2015 showed LV EF: 65%- 70%, dynamic obstruction at restin the outflow tract, with a peak velocity of 330 cm/sec and a peak gradient of 44 mm Hg. No RWMA and G1DD. Moderate systolic anterior motion of the anterior leaflet. Mod MR, small to mod  pericardial effusion was identified anterior to the heart with no evidence of hemodynamic compromise.  She denies any prior or current history of chest pain or SOB. No LE edema, orthopnea, or PND. No palpitations, dizziness or syncope. She is currently feeling quite well and would like to go home.    Past Medical History  Diagnosis Date  . GERD (gastroesophageal reflux disease)   . Hyperlipidemia   . Hypertension   . Hypothyroidism   . H/O: hematuria 07/2010  . PONV (postoperative nausea and vomiting)      Past Surgical History  Procedure Laterality Date  . Appendectomy      age 81  . Oophorectomy  1954  . Tonsillectomy      age 86  . Cholecystectomy       age 67  . Dilation and curettage of uterus      multiple's  . Fracture surgery  Dec 2009    left wrist  . Esophagogastroduodenoscopy N/A 08/06/2013    Procedure: ESOPHAGOGASTRODUODENOSCOPY (EGD);  Surgeon: Lear Ng, MD;  Location: [facility];  Service: Endoscopy;  Laterality: N/A;    Allergies  Allergen Reactions  . Aspirin Other (See Comments)    GI Bleed  . Ibuprofen Other (See Comments)    GI Bleed  . Ciprofloxacin     REACTION: hives    I have reviewed the patient's current medications . azithromycin  250 mg Oral Daily  . cefTRIAXone (ROCEPHIN)  IV  1 g Intravenous Q24H  . feeding supplement (ENSURE ENLIVE)  237 mL Oral BID BM  . levothyroxine  88 mcg Oral QAC breakfast  . pantoprazole  40 mg Oral BID  . simvastatin  10 mg Oral QHS  . sodium chloride flush  3 mL Intravenous Q12H     acetaminophen **OR** acetaminophen, hydrALAZINE, HYDROcodone-acetaminophen, meclizine, ondansetron **OR** ondansetron (ZOFRAN) IV, polyethylene glycol  Prior to Admission medications   Medication Sig Start Date End Date Taking? Authorizing Provider  acetaminophen (TYLENOL) 500 MG tablet Take 1,000 mg by mouth every 6 (six) hours as needed for mild pain.   Yes Historical Provider, MD  cetirizine (ZYRTEC) 10 MG tablet Take 10 mg by mouth daily.   Yes Historical Provider, MD  Cranberry 425 MG CAPS Take 425 mg by  mouth 2 (two) times daily.   Yes Historical Provider, MD  ferrous sulfate 325 (65 FE) MG tablet Take 325 mg by mouth every evening.    Yes Historical Provider, MD  meclizine (ANTIVERT) 25 MG tablet Take 25 mg by mouth every 4 (four) hours as needed. Dizziness 10/26/15  Yes Historical Provider, MD  mometasone (NASONEX) 50 MCG/ACT nasal spray Place 2 sprays into the nose daily.   Yes Historical Provider, MD  pantoprazole (PROTONIX) 40 MG tablet Take 1 tablet (40 mg total) by mouth 2 (two) times daily. 05/01/15  Yes Marletta Lor, MD  simvastatin (ZOCOR) 10 MG tablet  Take 1 tablet (10 mg total) by mouth at bedtime. 08/05/15  Yes Marletta Lor, MD  SYNTHROID 88 MCG tablet TAKE 1 TABLET DAILY (DOSAGECHANGE) 10/09/15  Yes Marletta Lor, MD  valsartan (DIOVAN) 160 MG tablet TAKE 1 TABLET DAILY 10/02/15   Marletta Lor, MD     Social History   Social History  . Marital Status: Widowed    Spouse Name: N/A  . Number of Children: 3  . Years of Education: N/A   Occupational History  . Not on file.   Social History Main Topics  . Smoking status: Former Smoker -- 0.50 packs/day for 63 years    Types: Cigarettes    Quit date: 08/05/2013  . Smokeless tobacco: Never Used  . Alcohol Use: No  . Drug Use: No  . Sexual Activity: Not on file   Other Topics Concern  . Not on file   Social History Narrative    Family Status  Relation Status Death Age  . Mother Deceased     age 26, pneumonia  . Father Deceased 46    M.I.   Family History  Problem Relation Age of Onset  . Thyroid disease Sister 63     ROS:  Full 14 point review of systems complete and found to be negative unless listed above.  Physical Exam: Blood pressure 122/75, pulse 70, temperature 97.9 F (36.6 C), temperature source Oral, resp. rate 18, height 5\' 6"  (1.676 m), weight 174 lb 9.6 oz (79.198 kg), SpO2 95 %.  General: Well developed, well nourished, female in no acute distress Head: Eyes PERRLA, No xanthomas.   Normocephalic and atraumatic, oropharynx without edema or exudate.   Lungs: CTAB Heart: HRRR S1 S2, no rub/gallop, Heart regular rate and rhythm with S1, S2 + murmur. pulses are 2+ extrem.   Neck: No carotid bruits. No lymphadenopathy.  JVD. Abdomen: Bowel sounds present, abdomen soft and non-tender without masses or hernias noted. Msk:  No spine or cva tenderness. No weakness, no joint deformities or effusions. Extremities: No clubbing or cyanosis. No LE edema.  Neuro: Alert and oriented X 3. No focal deficits noted. Psych:  Good affect, responds  appropriately Skin: No rashes or lesions noted.  Labs:   Lab Results  Component Value Date   WBC 7.4 11/24/2015   HGB 14.7 11/24/2015   HCT 46.0 11/24/2015   MCV 92.6 11/24/2015   PLT 184 11/24/2015   No results for input(s): INR in the last 72 hours.  Recent Labs Lab 11/24/15 0538  NA 144  K 3.4*  CL 105  CO2 30  BUN 12  CREATININE 0.70  CALCIUM 8.3*  PROT 7.0  BILITOT 0.8  ALKPHOS 68  ALT 23  AST 34  GLUCOSE 102*  ALBUMIN 3.6   MAGNESIUM  Date Value Ref Range Status  11/24/2015 1.7 1.7 - 2.4 mg/dL  Final    Recent Labs  11/24/15 1405 11/24/15 1959  TROPONINI <0.03 <0.03   No results for input(s): TROPIPOC in the last 72 hours. No results found for: PROBNP Lab Results  Component Value Date   CHOL 157 05/01/2015   HDL 58.80 05/01/2015   LDLCALC 79 05/01/2015   TRIG 96.0 05/01/2015   No results found for: DDIMER No results found for: LIPASE, AMYLASE TSH  Date/Time Value Ref Range Status  11/24/2015 05:55 AM 16.882* 0.350 - 4.500 uIU/mL Final  11/18/2015 11:56 AM 10.64* 0.35 - 4.50 uIU/mL Final   No results found for: VITAMINB12, FOLATE, FERRITIN, TIBC, IRON, RETICCTPCT  Echo: 11/26/2015 LV EF: 65% -   70% Study Conclusions - Left ventricle: The cavity size was normal. There was moderate   asymmetric hypertrophy of the septum. The appearance was   consistent with hypertrophic cardiomyopathy. Systolic function   was vigorous. The estimated ejection fraction was in the range of   65% to 70%. There was dynamic obstruction at restin the outflow   tract, with a peak velocity of 330 cm/sec and a peak gradient of   44 mm Hg. Wall motion was normal; there were no regional wall   motion abnormalities. Doppler parameters are consistent with   abnormal left ventricular relaxation (grade 1 diastolic   dysfunction). - Mitral valve: There was moderate systolic anterior motion of the   anterior leaflet. There was moderate regurgitation. - Pericardium,  extracardiac: A small to moderate pericardial   effusion was identified anterior to the heart. There was no   evidence of hemodynamic compromise. Impressions: - Findings are consistent with hypertrophic obstructive   cardiomyopathy.    ECG: NSR HR 74, LAD  Radiology:  Dg Chest 2 View  11/26/2015  CLINICAL DATA:  Hypoxia. EXAM: CHEST  2 VIEW COMPARISON:  11/24/2015 FINDINGS: The lungs are hyperinflated. There is perihilar peribronchial thickening. Heart size is normal. No focal consolidations. Suspect small bilateral pleural effusions or pleural blunting. No pulmonary edema. IMPRESSION: 1. Bronchitic changes. 2.  No focal acute pulmonary abnormality. Electronically Signed   By: Nolon Nations M.D.   On: 11/26/2015 09:57   Dg Knee 1-2 Views Left  11/25/2015  CLINICAL DATA:  Left knee pain without injury for several weeks. EXAM: LEFT KNEE - 1-2 VIEW COMPARISON:  None. FINDINGS: No fracture.  No bone lesion. Minor marginal osteophytes from the medial compartment. No other degenerative change. Bones are demineralized. No joint effusion. There are scattered vascular calcifications posteriorly. Soft tissues otherwise unremarkable. IMPRESSION: 1. No fracture or acute finding. 2. Minimal osteoarthritis.  No joint effusion. Electronically Signed   By: Lajean Manes M.D.   On: 11/25/2015 20:07   Ct Angio Chest Pe W/cm &/or Wo Cm  11/26/2015  CLINICAL DATA:  Acute hypoxic respiratory failure, long history of smoking EXAM: CT ANGIOGRAPHY CHEST WITH CONTRAST TECHNIQUE: Multidetector CT imaging of the chest was performed using the standard protocol during bolus administration of intravenous contrast. Multiplanar CT image reconstructions and MIPs were obtained to evaluate the vascular anatomy. CONTRAST:  174mL OMNIPAQUE IOHEXOL 350 MG/ML SOLN COMPARISON:  None. FINDINGS: Sagittal images of the spine shows diffuse osteopenia. Mild degenerative changes mid and lower thoracic spine. Sagittal view of the sternum is  unremarkable. Central airways are patent. Atherosclerotic calcifications of thoracic aorta and coronary arteries are noted. Heart size within normal limits. Tiny hiatal hernia is noted. Atherosclerotic plaques and calcifications are noted within proximal abdominal aorta. There is a small anterior pericardial effusion measures about  3 mm thickness. There is no mediastinal hematoma or adenopathy. No hilar adenopathy. The study is of excellent technical quality. There is no evidence of aortic dissection. No pulmonary embolus is noted. Images of the lung parenchyma shows bilateral emphysematous changes most extensive centrilobular emphysematous changes in upper lobes. There is no pulmonary edema. There is small or infiltrate in left base posteriorly. Minimal subtle focus of patchy infiltrate in right lower lobe posteriorly axial image 78. Bronchitic changes are noted bilateral lower lobes. Hyperinflation is noted. Review of the MIP images confirms the above findings. IMPRESSION: 1. No pulmonary embolus. 2. No mediastinal hematoma or adenopathy. 3. Tiny anterior pericardial effusion measures 3 mm thickness. 4. Atherosclerotic calcifications of thoracic and upper abdominal aorta. Atherosclerotic calcifications of coronary arteries. 5. Bilateral emphysematous changes are noted most extensive centrilobular emphysematous changes in upper lobes. 6. Bilateral lower lobe bronchitic changes are noted. There is small patchy atelectasis or infiltrate in left base posteriorly. Only tiny focus of subtle infiltrate is noted in right lower lobe. No pneumothorax. 7. Osteopenia and degenerative changes thoracic spine. Electronically Signed   By: Lahoma Crocker M.D.   On: 11/26/2015 17:07   Mr Lumbar Spine Wo Contrast  11/25/2015  CLINICAL DATA:  Left leg weakness. EXAM: MRI LUMBAR SPINE WITHOUT CONTRAST TECHNIQUE: Multiplanar, multisequence MR imaging of the lumbar spine was performed. No intravenous contrast was administered. COMPARISON:   Lumbar spine radiographs 11/23/2015 FINDINGS: Normal signal is present in the conus medullaris which terminates at L2. Slight anterolisthesis is present at L3-4 and L4-5. There is diffuse fatty infiltration of the marrow containing spaces. Rightward curvature of the lumbar spine is centered at L3. The Atherosclerotic changes are present within the abdominal aorta borderline aneurysmal dilation is present. The aorta measures 3.0 cm at the level of L3. There is borderline aneurysmal dilation of the left common iliac artery as well. Central sinus cysts are present in both kidneys. L1-2:  Negative. L2-3: Short pedicles, a leftward disc protrusion, and mild facet hypertrophy contribute to moderate left and mild right foraminal narrowing. The central canal is patent. L3-4: A leftward disc protrusion results in mild left foraminal stenosis. L4-5: A leftward disc protrusion is present. The central canal is patent. The foramina are patent bilaterally. L5-S1: A mild broad-based disc protrusion is present. The central canal is patent. The foramina are patent bilaterally. IMPRESSION: 1. Moderate left and mild right foraminal narrowing at L2-3. 2. Mild left foraminal stenosis at L3-4. 3. Leftward disc protrusion at L4-5 without significant stenosis. 4. In addition to the acquired spinal stenosis, congenitally short pedicles are noted. 5. Borderline aneurysmal dilation of the infrarenal abdominal aorta and left common iliac artery. 6. Extensive atherosclerotic changes throughout the visible aorta and branch vessels. Electronically Signed   By: San Morelle M.D.   On: 11/25/2015 20:38    ASSESSMENT AND PLAN:    Principal Problem:   Acute respiratory failure with hypoxia (HCC) Active Problems:   Essential hypertension   UTI   Tobacco abuse   Acute encephalopathy   Hypokalemia   Dehydration   Generalized weakness   UTI (lower urinary tract infection)   Malnutrition of moderate degree   Dawn Woods is  a 82 y.o. female with a history of HTN, HLD, hypothyroidism, previous tobacco abuse, baseline dementia, carotid stenosis, and GERD who was admitted to [facility] on 11/23/15 with UTI/pyelonephritis and acute encephalopathy. 2D ECHO showed HOCM and cardiology was consulted.   HOCM: asymptomatic. 2D ECHO on 11/26/2015 showed LV EF: 65%- 70%,  dynamic obstruction at restin the outflow tract, with a peak velocity of 330 cm/sec and a peak gradient of 44 mm Hg. No RWMA and G1DD. Moderate systolic anterior motion of the anterior leaflet. Mod MR, small to mod  pericardial effusion was identified anterior to the heart with no evidence of hemodynamic compromise. -- She denies any prior or current history of chest pain or SOB. No LE edema, orthopnea, or PND. No palpitations, dizziness or syncope.  -- She does take Diovan for BP control. We could switch this to a BB or non dihydropyridine CCB to try to lessen LVOT before discharge. Will defer to MD. We can arrange outpatient follow up for clinical monitoring over time.   HTN: As above, she does take Diovan for BP control. We could switch this to a BB or non dihydropyridine CCB before discharge in the setting of HOCM to try to lessen LVOT. Will defer to MD.  HLD: continue statin  Hypothyroid: TSH elevated and synthroid increased.   SignedCrista Luria 11/27/2015 11:32 AM  Pager [phone]  Co-Sign MD   Personally seen and examined. Agree with above. 2/6 Systolic Murmur (HOCM)-can improve with continued hydration.  Mild outflow obstruction If able, use metoprolol instead of Diovan in the future if blood pressure is able to allow.  No syncope, no prior MI, no CVA.  If she is unable to take any BP agent, this is fine.  AAA 3.2cm-check again in 3 years OK with DC from cardiac perspective. Please call if any questions.   Candee Furbish, MD

## 2015-11-27 NOTE — Progress Notes (Signed)
PROGRESS NOTE  Dawn Woods [id number] DOB: Nov 02, 1929 DOA: 11/23/2015 PCP: [name], MD  Summary: 82 year old woman recently [place], seen by PCP for generalized weakness and diagnosed with volume contraction. Presented to the emergency department 2/27 with ongoing complaints of generalized weakness, left leg weakness, possible short-term memory loss. Admitted for UTI, acute encephalopathy with nonfocal exam  Assessment/Plan: 1. Acute hypoxic respiratory failure. After extensive investigation further history, this is almost certainly related to severe emphysema as documented by chest CT. Longtime smoker who stopped smoking approximately 3 weeks ago. In addition to emphysema there are also bronchitic changes. There is no evidence to suggest pneumonia although she has been treated empirically with antibiotics. CT angiogram chest negative for PE and lower extremity venous Dopplers are negative. 2-D echocardiogram was unrevealing. No further evaluation suggested. On further review, hypoxia would explain her presenting symptoms of increased irritability, sleeping and worsening of cognition. 2. Bronchitis, possible pneumonia. Afebrile. 3. Reported left leg weakness with ataxic gait. MRI brain negative, left hip and lumbar spine films negative. No pain. No weakness on examination. Lumbar spine MRI without acute features to explain symptomology prior to admission. No further evaluation suggested at this time. 4. UTI, pyelonephritis. Resolved. 5. Acute encephalopathy superimposed on dementia. Acute component resolved. 6. Asymptomatic hypertrophic cardiomyopathy, new finding since echocardiogram 08/06/2013. Start low-dose beta blocker, discontinue Diovan. 7. Asymptomatic carotid stenosis. 8. Progressive generalized weakness, fatigue. Multifactorial including hypoxia, infection, dementia. 9. Elevated troponin. Spurious. Initial, repeat troponins negative. No  chest pain. EKG nonacute. 10. Hypothyroidism. TSH 16.82. Increased Synthroid. Followup as an outpatient. 11. 3.2 cm infrarenal abdominal aortic aneurysm. Recommend follow up by ultrasound in 3 years. 12. Possible depression. Daughter thinks the patient has depression and would like to try an antidepressant.   Appears stable. Will transition to oral antibiotics. Continue supplemental oxygen which she will likely need indefinitely. She is progressing with physical therapy. Plan for transfer skilled nursing facility 3/4.  Code Status: full code DVT prophylaxis: SCDs Family Communication: discussed with daughter Debbie/HCPOA by telephone, updated on plan, agrees with discharge 3/4. Disposition Plan: SNF 3/4  [name], MD  [facility]  Pager [phone] If 7PM-7AM, please contact night-coverage at [email or web address], password Ku [facility] 11/27/2015, 5:34 PM  LOS: 2 days   Consultants:  Cardiology  Occupational therapy recommending skilled skilled nursing facility  Procedures:  Echo Study Conclusions  - Left ventricle: The cavity size was normal. There was moderate  asymmetric hypertrophy of the septum. The appearance was  consistent with hypertrophic cardiomyopathy. Systolic function  was vigorous. The estimated ejection fraction was in the range of  65% to 70%. There was dynamic obstruction at restin the outflow  tract, with a peak velocity of 330 cm/sec and a peak gradient of  44 mm Hg. Wall motion was normal; there were no regional wall  motion abnormalities. Doppler parameters are consistent with  abnormal left ventricular relaxation (grade 1 diastolic  dysfunction). - Mitral valve: There was moderate systolic anterior motion of the  anterior leaflet. There was moderate regurgitation. - Pericardium, extracardiac: A small to moderate pericardial  effusion was identified anterior to the heart. There was no  evidence of hemodynamic compromise.  Impressions:  -  Findings are consistent with hypertrophic obstructive  cardiomyopathy.   Bilateral carotid u/s Summary:  - The vertebral arteries appear patent with antegrade flow. - Findings consistent with 1- 39 percent stenosis involving the  right internal carotid artery and the left internal carotid  artery. - Findings  consistent with >50% stenosis involving the left  external carotid artery.  Antibiotics:  Ceftriaxone 2/27 >> 3/3  Ceftin 3/4 >> 3/5  Azithromycin 3/2 >> 3/6  HPI/Subjective: Significantly hypoxic on room air, symptomatic especially with ambulation.  No complaints, breathing well. No pain. No left leg pain or weakness.  Objective: Filed Vitals:   11/26/15 2147 11/27/15 0445 11/27/15 1342 11/27/15 1700  BP: 120/71 122/75 127/67   Pulse: 76 70 69   Temp: 98.4 F (36.9 C) 97.9 F (36.6 C) 97.6 F (36.4 C)   TempSrc: Oral Oral Oral   Resp: 18 18 16    Height:      Weight:      SpO2: 95% 95% 94% 77%    Intake/Output Summary (Last 24 hours) at 11/27/15 1734 Last data filed at 11/27/15 1343  Gross per 24 hour  Intake    360 ml  Output      0 ml  Net    360 ml     Filed Weights   11/24/15 1513 11/26/15 0032  Weight: 79.198 kg (174 lb 9.6 oz) 79.198 kg (174 lb 9.6 oz)    Exam:    General:  Appears comfortable, calm. Cardiovascular: Regular rate and rhythm, no murmur, rub or gallop. No lower extremity edema. Respiratory: Clear to auscultation bilaterally, no wheezes, rales or rhonchi. Normal respiratory effort. Musculoskeletal: grossly normal tone bilateral upper and lower extremities; strength and range of motion in the lower extremities appears symmetric.  Psychiatric: grossly normal mood and affect, speech fluent and appropriate Neurologic: grossly non-focal.   New data reviewed  Troponins negative   Scheduled Meds: . azithromycin  250 mg Oral Daily  . cefTRIAXone (ROCEPHIN)  IV  1 g Intravenous Q24H  . feeding supplement (ENSURE ENLIVE)  237  mL Oral BID BM  . levothyroxine  88 mcg Oral QAC breakfast  . pantoprazole  40 mg Oral BID  . simvastatin  10 mg Oral QHS  . sodium chloride flush  3 mL Intravenous Q12H   Continuous Infusions:    Principal Problem:   Acute respiratory failure with hypoxia (HCC) Active Problems:   Essential hypertension   UTI   Tobacco abuse   Acute encephalopathy   Hypokalemia   Dehydration   Generalized weakness   UTI (lower urinary tract infection)   Malnutrition of moderate degree   Time spent 20 minutes

## 2015-11-27 NOTE — Clinical Social Work Note (Signed)
Clinical Social Work Assessment-Late Entry  Patient Details  Name: Dawn Woods MRN: [id number] Date of Birth: 02/23/1930  Date of referral:  11/27/15               Reason for consult:  Discharge Planning                Permission sought to share information with:  Family Supports Permission granted to share information::     Name::     Consuello Closs  Agency::     Relationship::  daughter  Contact Information:  [phone]  Housing/Transportation Living arrangements for the past 2 months:  Single Family Home Source of Information:  Adult Children Patient Interpreter Needed:  None Criminal Activity/Legal Involvement Pertinent to Current Situation/Hospitalization:  No - Comment as needed Significant Relationships:  Adult Children Lives with:  Self Do you feel safe going back to the place where you live?  No Need for family participation in patient care:  Yes (Comment)  Care giving concerns:  Pt admitted from home alone. Pt daughter reports that pt does not have 24 hour care at home.    Social Worker assessment / plan:  CSW received referral for disposition planning.   CSW spoke with pt daughter via telephone. Pt daughter reports that pt lives alone and pt confusion is not baseline. Pt daughter reports that per [facility] pt may not qualify for SNF. CSW discussed that if pt has three night inpatient stay then chance pt could qualify. Pt daughter agreeable to CSW initiating SNF search to [facility] in case pt has three night inpatient stay, but pt daughter wants private pay options for SNF as well.   CSW completed FL2 and initiated SNF search to [facility].   CSW to follow up with pt daughter to further discuss disposition planning.   Employment status:  Retired Health visitor PT Recommendations:  Home with [place], [place] / Referral to community resources:  [place]  Patient/Family's Response to care:  Pt  pleasantly confused. Pt daughter reports that pt is not baseline confused and expressed concern about if pt confusion continues. Pt daughter trying to make best decisions for pt and post hospital care plans.   Patient/Family's Understanding of and Emotional Response to Diagnosis, Current Treatment, and Prognosis:  Pt daughter displayed understanding re: pt diagnosis and treatment plan.   Emotional Assessment Appearance:  Appears stated age Attitude/Demeanor/Rapport:  Other (pleasantly confused) Affect (typically observed):  Appropriate Orientation:  Oriented to Self, Oriented to Place, Oriented to  Time, Oriented to Situation (acute encephalopathy) Alcohol / Substance use:  Not Applicable Psych involvement (Current and /or in the community):  No (Comment)  Discharge Needs  Concerns to be addressed:  Discharge Planning Concerns Readmission within the last 30 days:  No Current discharge risk:  Lives alone Barriers to Discharge:  Continued Medical Work up   Ladell Pier, [place] 11/27/2015, 8:10 AM  [phone]

## 2015-11-27 NOTE — Progress Notes (Signed)
CSW continuing to follow.   CSW spoke with MD who states pt not yet ready for discharge. Anticipate weekend discharge.  CSW contacted pt daughter, Jackelyn Poling via telephone. CSW discussed with pt daughter that given pt not yet ready for discharge that pt will have third inpatient night tonight for Medicare to cover SNF. Pt daughter wants pt to go to SNF as pt lives alone and pt family unable to provide 24 hour care. CSW discussed SNF bed offers and clarified pt daughter questions. CSW to provide pt daughter with ALF list for future planning as pt daughter is unsure that pt will be able to return home following rehab stay.   CSW provided SNF bed offers. Pt daughter plans to consider option and give CSW decision for SNF today.   CSW to continue to follow for pt disposition planning.  Alison Murray, MSW, [place] Work [phone]

## 2015-11-27 NOTE — Progress Notes (Signed)
Physical Therapy Treatment Patient Details Name: Dawn Woods MRN: [id number] DOB: 12/28/1929 Today's Date: 11/27/2015    History of Present Illness Dawn Woods is a 82 y.o. femalewith past medical history of GERD (gastroesophageal reflux disease); Hyperlipidemia; Hypertension; Hypothyroidism; H/O: hematuria (07/2010); and PONV (postoperative nausea and vomiting) and admitted with UTI, acute encephalopathy       PT Comments    Progressing with mobility. Pt agreeable to ambulate to and from bathroom on today. Tolerated distance well.   Follow Up Recommendations  SNF (plan is for snf per sw notes)     Equipment Recommendations  None recommended by PT    Recommendations for Other Services       Precautions / Restrictions Precautions Precautions: Fall Precaution Comments: monitor sats Restrictions Weight Bearing Restrictions: No    Mobility  Bed Mobility Overal bed mobility: Needs Assistance Bed Mobility: Supine to Sit;Sit to Supine     Supine to sit: Supervision Sit to supine: Supervision   General bed mobility comments: for safety  Transfers Overall transfer level: Needs assistance Equipment used: Rolling walker (2 wheeled) Transfers: Sit to/from Stand Sit to Stand: Min guard Stand pivot transfers: Min guard       General transfer comment: close guard for safety.   Ambulation/Gait Ambulation/Gait assistance: Min guard Ambulation Distance (Feet): 15 Feet (x2) Assistive device: None Gait Pattern/deviations: Step-through pattern;Trunk flexed     General Gait Details: close guard for safety. Pt "furniture walked" at times. walked to and from bathroom. Pt tolerated distance well.    Stairs            Wheelchair Mobility    Modified Rankin (Stroke Patients Only)       Balance Overall balance assessment: Needs assistance         Standing balance support: During functional activity Standing balance-Leahy Scale: Fair                       Cognition Arousal/Alertness: Awake/alert Behavior During Therapy: WFL for tasks assessed/performed Overall Cognitive Status: No family/caregiver present to determine baseline cognitive functioning                      Exercises      General Comments        Pertinent Vitals/Pain Pain Assessment: Faces Faces Pain Scale: Hurts little more Pain Location: L lower abdomen Pain Descriptors / Indicators: Sore Pain Intervention(s): Monitored during session    Home Living                      Prior Function            PT Goals (current goals can now be found in the care plan section) Progress towards PT goals: Progressing toward goals    Frequency  Min 3X/week    PT Plan Current plan remains appropriate    Co-evaluation             End of Session   Activity Tolerance: Patient tolerated treatment well Patient left: in bed;with call bell/phone within reach;with bed alarm set     Time: [id number] PT Time Calculation (min) (ACUTE ONLY): 9 min  Charges:  $Gait Training: 8-22 mins                    G Codes:      Weston Anna, MPT Pager: [phone]

## 2015-11-27 NOTE — Progress Notes (Signed)
CSW continuing to follow.   CSW received notification from pt daughter, Jackelyn Poling that pt family toured facilities and accept bed offer at [facility].   CSW confirmed with [place] that facility can accept pt tomorrow.   CSW to continue to follow to provide support and assist with pt discharge to [facility]. Anticipated d/c 11/28/15.   Alison Murray, MSW, [place] Work [phone]

## 2015-11-27 NOTE — Clinical Social Work Placement (Signed)
   CLINICAL SOCIAL WORK PLACEMENT  NOTE  Date:  11/27/2015  Patient Details  Name: Dawn Woods MRN: [id number] Date of Birth: 1929-11-07  Clinical Social Work is seeking post-discharge placement for this patient at the [place] level of care (*CSW will initial, date and re-position this form in  chart as items are completed):  Yes   Patient/family provided with [place] Work Department's list of facilities offering this level of care within the geographic area requested by the patient (or if unable, by the patient's family).  Yes   Patient/family informed of their freedom to choose among providers that offer the needed level of care, that participate in Medicare, Medicaid or managed care program needed by the patient, have an available bed and are willing to accept the patient.  Yes   Patient/family informed of [facility]'s ownership interest in [facility] and [facility], as well as of the fact that they are under no obligation to receive care at these facilities.  PASRR submitted to EDS on 11/26/15     PASRR number received on 11/26/15     Existing PASRR number confirmed on       FL2 transmitted to all facilities in geographic area requested by pt/family on 11/26/15     FL2 transmitted to all facilities within larger geographic area on       Patient informed that his/her managed care company has contracts with or will negotiate with certain facilities, including the following:        Yes   Patient/family informed of bed offers received.  Patient chooses bed at [facility]     Physician recommends and patient chooses bed at      Patient to be transferred to [facility] on  .  Patient to be transferred to facility by       Patient family notified on   of transfer.  Name of family member notified:        PHYSICIAN Please sign FL2     Additional Comment:     _______________________________________________ Ladell Pier, LCSW 11/27/2015, 3:39 PM

## 2015-11-27 NOTE — Clinical Social Work Placement (Signed)
   CLINICAL SOCIAL WORK PLACEMENT  NOTE  Date:  11/27/2015  Patient Details  Name: Dawn Woods MRN: [id number] Date of Birth: Oct 22, 1929  Clinical Social Work is seeking post-discharge placement for this patient at the [place] level of care (*CSW will initial, date and re-position this form in  chart as items are completed):  Yes   Patient/family provided with [facility] Work Department's list of facilities offering this level of care within the geographic area requested by the patient (or if unable, by the patient's family).  Yes   Patient/family informed of their freedom to choose among providers that offer the needed level of care, that participate in Medicare, Medicaid or managed care program needed by the patient, have an available bed and are willing to accept the patient.  Yes   Patient/family informed of [facility]'s ownership interest in [facility] and [facility], as well as of the fact that they are under no obligation to receive care at these facilities.  PASRR submitted to EDS on 11/26/15     PASRR number received on 11/26/15     Existing PASRR number confirmed on       FL2 transmitted to all facilities in geographic area requested by pt/family on 11/26/15     FL2 transmitted to all facilities within larger geographic area on       Patient informed that his/her managed care company has contracts with or will negotiate with certain facilities, including the following:            Patient/family informed of bed offers received.  Patient chooses bed at       Physician recommends and patient chooses bed at      Patient to be transferred to   on  .  Patient to be transferred to facility by       Patient family notified on   of transfer.  Name of family member notified:        PHYSICIAN Please sign FL2     Additional Comment:    _______________________________________________ Ladell Pier, LCSW 11/27/2015, 8:15  AM

## 2015-11-27 NOTE — Discharge Summary (Signed)
Physician Discharge Summary  Dawn Woods [id number] DOB: 20-Nov-1929 DOA: 11/23/2015  PCP: [name], MD  Admit date: 11/23/2015 Discharge date: 11/28/2015  Recommendations for Outpatient Follow-up:  1. New diagnosis of hypoxia likely secondary to underlying emphysema 2. Dementia with suspected depression, trial of Lexapro started 3. Asymptomatic hypertrophic cardiomyopathy, started on Lopressor 4. Asymptomatic carotid stenosis 5. Hypothyroidism with TSH 16.82. Synthroid dose increased to 100 g daily. Suggest repeat TSH first week in April. 6. 3.2 cm infrarenal abdominal aortic aneurysm. Recommend follow up by ultrasound in 3 years.   Follow-up Information    Follow up with [facility].   Specialty:  [facility] information:   [address] [phone]      Follow up with [name], MD. Schedule an appointment as soon as possible for a visit in 2 weeks.   Specialty:  Internal Medicine   Contact information:   [place] [address] [phone]      Discharge Diagnoses:  1. Acute hypoxic respiratory failure 2. Chronic bronchitis 3. Possible community-acquired pneumonia 4. UTI, pyelonephritis 5. Left leg weakness 6. Acute encephalopathy superimposed on dementia 7. Asymptomatic hypertrophic cardiomyopathy 8. Asymptomatic carotid stenosis 9. Progressive generalized weakness 10. Hypothyroidism 11. Infrarenal abdominal aortic aneurysm  Discharge Condition: improved Disposition: SNF  Diet recommendation: regular   Filed Weights   11/24/15 1513 11/26/15 0032  Weight: 79.198 kg (174 lb 9.6 oz) 79.198 kg (174 lb 9.6 oz)    History of present illness:  82 year old woman recently returned from an [place] of [place], seen by PCP for generalized weakness and diagnosed with volume contraction. Presented to the emergency department  2/27 with ongoing complaints of generalized weakness, left leg weakness, possible short-term memory loss. Admitted for UTI, acute encephalopathy with nonfocal exam  Hospital Course:  Patient was admitted and treated with empiric antibiotics for UTI/pyelonephritis which rapidly resolved. Reported left leg weakness prior to admission was not demonstrated during hospitalization however extensive investigation was conducted including negative imaging of the hip, lumbar spine. Has done well with physical therapy but rehabilitation has been recommended. Acute encephalopathy likely secondary to acute infection superimposed on dementia, now stable. Hypoxia was noted which was a new finding, after extensive investigation as below this is likely solely related to emphysema. Hospitalization was prolonged by need to investigate left leg weakness as well as hypoxia. She was treated with empiric antibiotics for bronchitis or possibly early pneumonia. Individual issues as below.  1. Acute hypoxic respiratory failure. After extensive investigation further history, this is almost certainly related to severe emphysema as documented by chest CT. Longtime smoker who stopped smoking approximately 3 weeks ago. In addition to emphysema there are also bronchitic changes. There is no evidence to suggest pneumonia although she has been treated empirically with antibiotics. CT angiogram chest negative for PE and lower extremity venous Dopplers are negative. 2-D echocardiogram was unrevealing. No further evaluation suggested. On further review, hypoxia would explain her presenting symptoms of increased irritability, sleeping and worsening of cognition. 2. Bronchitis, possible pneumonia. Afebrile. 3. Reported left leg weakness with ataxic gait. MRI brain negative, left hip and lumbar spine films negative. No pain. No weakness on examination. Lumbar spine MRI without acute features to explain symptomology prior to admission. No further  evaluation suggested at this time. 4. UTI, pyelonephritis. Resolved. 5. Acute encephalopathy superimposed on dementia. Acute component resolved. 6. Asymptomatic hypertrophic cardiomyopathy, new finding since echocardiogram 08/06/2013. Start  low-dose beta blocker, discontinue Diovan. 7. Asymptomatic carotid stenosis. 8. Progressive generalized weakness, fatigue. Multifactorial including hypoxia, infection, dementia. 9. Elevated troponin. Spurious. Initial, repeat troponins negative. No chest pain. EKG nonacute. 10. Hypothyroidism. TSH 16.82. Increased Synthroid. Followup as an outpatient. 11. 3.2 cm infrarenal abdominal aortic aneurysm. Recommend follow up by ultrasound in 3 years. 12. Possible depression. Daughter thinks the patient has depression and would like to try an antidepressant.  Consultants:  Cardiology  Occupational therapy recommending skilled skilled nursing facility  Procedures:  Echo Study Conclusions  - Left ventricle: The cavity size was normal. There was moderate  asymmetric hypertrophy of the septum. The appearance was  consistent with hypertrophic cardiomyopathy. Systolic function  was vigorous. The estimated ejection fraction was in the range of  65% to 70%. There was dynamic obstruction at restin the outflow  tract, with a peak velocity of 330 cm/sec and a peak gradient of  44 mm Hg. Wall motion was normal; there were no regional wall  motion abnormalities. Doppler parameters are consistent with  abnormal left ventricular relaxation (grade 1 diastolic  dysfunction). - Mitral valve: There was moderate systolic anterior motion of the  anterior leaflet. There was moderate regurgitation. - Pericardium, extracardiac: A small to moderate pericardial  effusion was identified anterior to the heart. There was no  evidence of hemodynamic compromise.  Impressions:  - Findings are consistent with hypertrophic obstructive   cardiomyopathy.   Bilateral carotid u/s Summary:  - The vertebral arteries appear patent with antegrade flow. - Findings consistent with 1- 39 percent stenosis involving the  right internal carotid artery and the left internal carotid  artery. - Findings consistent with >50% stenosis involving the left  external carotid artery.  Antibiotics:  Ceftriaxone 2/27 >> 3/3  Ceftin 3/4 >> 3/5  Azithromycin 3/2 >> 3/6  Discharge Instructions   Current Discharge Medication List    START taking these medications   Details  azithromycin (ZITHROMAX) 250 MG tablet Take 1 tablet (250 mg total) by mouth daily. Last dose 3/6 in morning.    cefUROXime (CEFTIN) 500 MG tablet Take 1 tablet (500 mg total) by mouth 2 (two) times daily with a meal. Last dose 3/5 in evening.    escitalopram (LEXAPRO) 5 MG tablet Take 1 tablet (5 mg total) by mouth daily.    feeding supplement, ENSURE ENLIVE, (ENSURE ENLIVE) LIQD Take 237 mLs by mouth 2 (two) times daily between meals.    metoprolol tartrate (LOPRESSOR) 25 MG tablet Take 0.5 tablets (12.5 mg total) by mouth 2 (two) times daily.      CONTINUE these medications which have CHANGED   Details  acetaminophen (TYLENOL) 500 MG tablet Take 1 tablet (500 mg total) by mouth every 6 (six) hours as needed for mild pain.    levothyroxine (SYNTHROID, LEVOTHROID) 100 MCG tablet Take 1 tablet (100 mcg total) by mouth daily before breakfast.      CONTINUE these medications which have NOT CHANGED   Details  cetirizine (ZYRTEC) 10 MG tablet Take 10 mg by mouth daily.    Cranberry 425 MG CAPS Take 425 mg by mouth 2 (two) times daily.    ferrous sulfate 325 (65 FE) MG tablet Take 325 mg by mouth every evening.     meclizine (ANTIVERT) 25 MG tablet Take 25 mg by mouth every 4 (four) hours as needed. Dizziness Refills: 0    mometasone (NASONEX) 50 MCG/ACT nasal spray Place 2 sprays into the nose daily.    pantoprazole (PROTONIX) 40 MG  tablet Take 1  tablet (40 mg total) by mouth 2 (two) times daily. Qty: 180 tablet, Refills: 3    simvastatin (ZOCOR) 10 MG tablet Take 1 tablet (10 mg total) by mouth at bedtime. Qty: 90 tablet, Refills: 1      STOP taking these medications     valsartan (DIOVAN) 160 MG tablet        Allergies  Allergen Reactions  . Aspirin Other (See Comments)    GI Bleed  . Ibuprofen Other (See Comments)    GI Bleed  . Ciprofloxacin     REACTION: hives    The results of significant diagnostics from this hospitalization (including imaging, microbiology, ancillary and laboratory) are listed below for reference.    Significant Diagnostic Studies: Dg Chest 2 View  11/26/2015  CLINICAL DATA:  Hypoxia. EXAM: CHEST  2 VIEW COMPARISON:  11/24/2015 FINDINGS: The lungs are hyperinflated. There is perihilar peribronchial thickening. Heart size is normal. No focal consolidations. Suspect small bilateral pleural effusions or pleural blunting. No pulmonary edema. IMPRESSION: 1. Bronchitic changes. 2.  No focal acute pulmonary abnormality. Electronically Signed   By: Nolon Nations M.D.   On: 11/26/2015 09:57   Dg Chest 2 View  11/23/2015  CLINICAL DATA:  Weakness and coughing since last week. History of hypertension. EXAM: CHEST  2 VIEW COMPARISON:  08/05/2013 FINDINGS: Cardiac silhouette is mildly enlarged. No mediastinal or hilar masses or evidence of adenopathy. Lungs are hyperexpanded. There are prominent bronchovascular markings. No evidence of pneumonia or pulmonary edema. No pleural effusion or pneumothorax. Bony thorax demineralized but intact. IMPRESSION: No acute cardiopulmonary disease. Electronically Signed   By: Lajean Manes M.D.   On: 11/23/2015 20:56   Dg Lumbar Spine 2-3 Views  11/23/2015  CLINICAL DATA:  Pt c/o left anterior abd pain inguinally s/p fall today, pt seems confused, also c/o weakness. EXAM: LUMBAR SPINE - 2-3 VIEW COMPARISON:  None. FINDINGS: No fracture.  No spondylolisthesis. Moderate loss of  disc height at L3-L4-L4-L5 with mild loss of disc height at L5-S1. Bones are diffusely demineralized. There are calcifications along the abdominal aorta. My old focal dilation of the lower abdominal aorta measures approximately 3.2 cm. IMPRESSION: 1. No fracture or acute finding. 2. Lower lumbar spine disc degenerative change. Diffuse bone demineralization. 3. 3.2 cm infrarenal abdominal aortic aneurysm. Recommend followup by ultrasound in 3 years. This recommendation follows ACR consensus guidelines: White Paper of the ACR Incidental Findings Committee II on Vascular Findings. Natasha Mead Coll Radiol 2013; 10:789-794 Electronically Signed   By: Lajean Manes M.D.   On: 11/23/2015 23:29   Dg Knee 1-2 Views Left  11/25/2015  CLINICAL DATA:  Left knee pain without injury for several weeks. EXAM: LEFT KNEE - 1-2 VIEW COMPARISON:  None. FINDINGS: No fracture.  No bone lesion. Minor marginal osteophytes from the medial compartment. No other degenerative change. Bones are demineralized. No joint effusion. There are scattered vascular calcifications posteriorly. Soft tissues otherwise unremarkable. IMPRESSION: 1. No fracture or acute finding. 2. Minimal osteoarthritis.  No joint effusion. Electronically Signed   By: Lajean Manes M.D.   On: 11/25/2015 20:07   Ct Head Wo Contrast  11/23/2015  CLINICAL DATA:  83 year old female with altered mental status EXAM: CT HEAD WITHOUT CONTRAST TECHNIQUE: Contiguous axial images were obtained from the base of the skull through the vertex without intravenous contrast. COMPARISON:  None. FINDINGS: There is no acute intracranial hemorrhage. There is moderate age-related atrophy and chronic microvascular ischemic changes. There is no mass  effect or midline shift. There is mild mucoperiosteal thickening of the paranasal sinuses. No air-fluid level. The mastoid air cells are clear. The calvarium is intact. IMPRESSION: No acute intracranial hemorrhage. Age-related atrophy and chronic  microvascular ischemic disease. If symptoms persist and there are no contraindications, MRI may provide better evaluation if clinically indicated. Electronically Signed   By: Anner Crete M.D.   On: 11/23/2015 23:11   Ct Angio Chest Pe W/cm &/or Wo Cm  11/26/2015  CLINICAL DATA:  Acute hypoxic respiratory failure, long history of smoking EXAM: CT ANGIOGRAPHY CHEST WITH CONTRAST TECHNIQUE: Multidetector CT imaging of the chest was performed using the standard protocol during bolus administration of intravenous contrast. Multiplanar CT image reconstructions and MIPs were obtained to evaluate the vascular anatomy. CONTRAST:  161mL OMNIPAQUE IOHEXOL 350 MG/ML SOLN COMPARISON:  None. FINDINGS: Sagittal images of the spine shows diffuse osteopenia. Mild degenerative changes mid and lower thoracic spine. Sagittal view of the sternum is unremarkable. Central airways are patent. Atherosclerotic calcifications of thoracic aorta and coronary arteries are noted. Heart size within normal limits. Tiny hiatal hernia is noted. Atherosclerotic plaques and calcifications are noted within proximal abdominal aorta. There is a small anterior pericardial effusion measures about 3 mm thickness. There is no mediastinal hematoma or adenopathy. No hilar adenopathy. The study is of excellent technical quality. There is no evidence of aortic dissection. No pulmonary embolus is noted. Images of the lung parenchyma shows bilateral emphysematous changes most extensive centrilobular emphysematous changes in upper lobes. There is no pulmonary edema. There is small or infiltrate in left base posteriorly. Minimal subtle focus of patchy infiltrate in right lower lobe posteriorly axial image 78. Bronchitic changes are noted bilateral lower lobes. Hyperinflation is noted. Review of the MIP images confirms the above findings. IMPRESSION: 1. No pulmonary embolus. 2. No mediastinal hematoma or adenopathy. 3. Tiny anterior pericardial effusion  measures 3 mm thickness. 4. Atherosclerotic calcifications of thoracic and upper abdominal aorta. Atherosclerotic calcifications of coronary arteries. 5. Bilateral emphysematous changes are noted most extensive centrilobular emphysematous changes in upper lobes. 6. Bilateral lower lobe bronchitic changes are noted. There is small patchy atelectasis or infiltrate in left base posteriorly. Only tiny focus of subtle infiltrate is noted in right lower lobe. No pneumothorax. 7. Osteopenia and degenerative changes thoracic spine. Electronically Signed   By: Lahoma Crocker M.D.   On: 11/26/2015 17:07   Mr Brain Wo Contrast  11/24/2015  CLINICAL DATA:  Acute encephalopathy. EXAM: MRI HEAD WITHOUT CONTRAST TECHNIQUE: Multiplanar, multiecho pulse sequences of the brain and surrounding structures were obtained without intravenous contrast. COMPARISON:  Head CT from yesterday FINDINGS: Calvarium and upper cervical spine: No focal marrow signal abnormality. Orbits: Bilateral cataract resection. Aspherical appearance of the globes may be related to motion on T2 weighted imaging. Sinuses and Mastoids: Clear. Brain: No acute infarct, hemorrhage, hydrocephalus, or mass lesion. No evidence of large vessel occlusion. There is confluent white matter T2 hyperintensity in the cerebral hemispheres, without mass effect or cortical involvement and compatible with extensive chronic small vessel disease given patient's risk factors and age. Patchy small vessel ischemic change also seen in the bilateral pons. Prominent perivascular spaces in the deep gray nuclei and mesial temporal lobes, often seen with chronic hypertension. Motion degraded gradient imaging but no signs of chronic microhemorrhage. Generalized cerebral volume loss, age congruent. IMPRESSION: 1. Motion degraded study without acute finding.  No acute infarct. 2. Extensive chronic small vessel disease. Electronically Signed   By: Neva Seat.D.  On: 11/24/2015 07:09   Mr  Lumbar Spine Wo Contrast  11/25/2015  CLINICAL DATA:  Left leg weakness. EXAM: MRI LUMBAR SPINE WITHOUT CONTRAST TECHNIQUE: Multiplanar, multisequence MR imaging of the lumbar spine was performed. No intravenous contrast was administered. COMPARISON:  Lumbar spine radiographs 11/23/2015 FINDINGS: Normal signal is present in the conus medullaris which terminates at L2. Slight anterolisthesis is present at L3-4 and L4-5. There is diffuse fatty infiltration of the marrow containing spaces. Rightward curvature of the lumbar spine is centered at L3. The Atherosclerotic changes are present within the abdominal aorta borderline aneurysmal dilation is present. The aorta measures 3.0 cm at the level of L3. There is borderline aneurysmal dilation of the left common iliac artery as well. Central sinus cysts are present in both kidneys. L1-2:  Negative. L2-3: Short pedicles, a leftward disc protrusion, and mild facet hypertrophy contribute to moderate left and mild right foraminal narrowing. The central canal is patent. L3-4: A leftward disc protrusion results in mild left foraminal stenosis. L4-5: A leftward disc protrusion is present. The central canal is patent. The foramina are patent bilaterally. L5-S1: A mild broad-based disc protrusion is present. The central canal is patent. The foramina are patent bilaterally. IMPRESSION: 1. Moderate left and mild right foraminal narrowing at L2-3. 2. Mild left foraminal stenosis at L3-4. 3. Leftward disc protrusion at L4-5 without significant stenosis. 4. In addition to the acquired spinal stenosis, congenitally short pedicles are noted. 5. Borderline aneurysmal dilation of the infrarenal abdominal aorta and left common iliac artery. 6. Extensive atherosclerotic changes throughout the visible aorta and branch vessels. Electronically Signed   By: San Morelle M.D.   On: 11/25/2015 20:38   Dg Chest Port 1 View  11/24/2015  CLINICAL DATA:  Acute onset of shortness of breath.  Initial encounter. EXAM: PORTABLE CHEST 1 VIEW COMPARISON:  Chest radiograph performed 11/23/2015 FINDINGS: The lungs are well-aerated. Vascular congestion is noted. Mild bibasilar opacities could reflect mild interstitial edema. There is no evidence of pleural effusion or pneumothorax. The cardiomediastinal silhouette is borderline normal in size. No acute osseous abnormalities are seen. IMPRESSION: Vascular congestion noted. Mild bibasilar opacities could reflect mild interstitial edema. Electronically Signed   By: Garald Balding M.D.   On: 11/24/2015 05:19   Dg Hip Unilat With Pelvis 2-3 Views Left  11/23/2015  CLINICAL DATA:  Pt c/o left anterior abd pain inguinally s/p fall today, pt seems confused, also c/o weakness. EXAM: DG HIP (WITH OR WITHOUT PELVIS) 2-3V LEFT COMPARISON:  None. FINDINGS: No fracture.  No bone lesion. Hip joints, SI joints and symphysis pubis are normally spaced and aligned. Bones are demineralized. Soft tissues are unremarkable other than vascular calcifications. IMPRESSION: No fracture or acute finding. Electronically Signed   By: Lajean Manes M.D.   On: 11/23/2015 23:30    Microbiology: Recent Results (from the past 240 hour(s))  Urine culture     Status: None   Collection Time: 11/23/15  8:00 AM  Result Value Ref Range Status   Specimen Description URINE, CLEAN CATCH  Final   Special Requests NONE  Final   Culture   Final    MULTIPLE SPECIES PRESENT, SUGGEST RECOLLECTION Performed at [facility]    Report Status 11/25/2015 FINAL  Final  Blood culture (routine x 2)     Status: None (Preliminary result)   Collection Time: 11/23/15 10:14 PM  Result Value Ref Range Status   Specimen Description BLOOD LEFT ANTECUBITAL  Final   Special Requests BOTTLES DRAWN  AEROBIC AND ANAEROBIC 5ML  Final   Culture   Final    NO GROWTH 3 DAYS Performed at [facility]    Report Status PENDING  Incomplete  Blood culture (routine x 2)     Status: None (Preliminary  result)   Collection Time: 11/23/15 10:55 PM  Result Value Ref Range Status   Specimen Description BLOOD LEFT WRIST  Final   Special Requests   Final    BOTTLES DRAWN AEROBIC AND ANAEROBIC 10 CC AER, 5 CC ANA   Culture   Final    NO GROWTH 3 DAYS Performed at [facility]    Report Status PENDING  Incomplete     Labs: Basic Metabolic Panel:  Recent Labs Lab 11/23/15 1939 11/24/15 0538  NA 141 144  K 3.0* 3.4*  CL 102 105  CO2 30 30  GLUCOSE 113* 102*  BUN 15 12  CREATININE 0.79 0.70  CALCIUM 8.6* 8.3*  MG  --  1.7  PHOS  --  2.1*   Liver Function Tests:  Recent Labs Lab 11/23/15 1939 11/24/15 0538  AST 35 34  ALT 22 23  ALKPHOS 65 68  BILITOT 0.7 0.8  PROT 6.8 7.0  ALBUMIN 3.5 3.6   CBC:  Recent Labs Lab 11/23/15 1939 11/24/15 0538  WBC 6.1 7.4  HGB 14.5 14.7  HCT 46.2* 46.0  MCV 93.3 92.6  PLT 214 184   Cardiac Enzymes:  Recent Labs Lab 11/24/15 0207 11/24/15 0850 11/24/15 1405 11/24/15 1959  TROPONINI <0.03 0.15* <0.03 <0.03   CBG:  Recent Labs Lab 11/23/15 1932  GLUCAP 113*    Principal Problem:   Acute respiratory failure with hypoxia (HCC) Active Problems:   Essential hypertension   UTI   Tobacco abuse   Acute encephalopathy   Hypokalemia   Dehydration   Generalized weakness   UTI (lower urinary tract infection)   Malnutrition of moderate degree   Time coordinating discharge: 25 minutes  Signed:  [name], MD [facility] 11/28/2015, 8:45 AM

## 2015-11-28 DIAGNOSIS — J188 Other pneumonia, unspecified organism: Secondary | ICD-10-CM | POA: Diagnosis not present

## 2015-11-28 DIAGNOSIS — Z720 Tobacco use: Secondary | ICD-10-CM | POA: Diagnosis not present

## 2015-11-28 DIAGNOSIS — J42 Unspecified chronic bronchitis: Secondary | ICD-10-CM | POA: Diagnosis not present

## 2015-11-28 DIAGNOSIS — E785 Hyperlipidemia, unspecified: Secondary | ICD-10-CM | POA: Diagnosis not present

## 2015-11-28 DIAGNOSIS — N390 Urinary tract infection, site not specified: Secondary | ICD-10-CM | POA: Diagnosis not present

## 2015-11-28 DIAGNOSIS — I714 Abdominal aortic aneurysm, without rupture: Secondary | ICD-10-CM | POA: Diagnosis not present

## 2015-11-28 DIAGNOSIS — E034 Atrophy of thyroid (acquired): Secondary | ICD-10-CM | POA: Diagnosis not present

## 2015-11-28 DIAGNOSIS — J9601 Acute respiratory failure with hypoxia: Secondary | ICD-10-CM | POA: Diagnosis not present

## 2015-11-28 DIAGNOSIS — F0390 Unspecified dementia without behavioral disturbance: Secondary | ICD-10-CM | POA: Diagnosis not present

## 2015-11-28 DIAGNOSIS — I10 Essential (primary) hypertension: Secondary | ICD-10-CM | POA: Diagnosis not present

## 2015-11-28 DIAGNOSIS — I421 Obstructive hypertrophic cardiomyopathy: Secondary | ICD-10-CM | POA: Diagnosis not present

## 2015-11-28 DIAGNOSIS — J40 Bronchitis, not specified as acute or chronic: Secondary | ICD-10-CM | POA: Diagnosis not present

## 2015-11-28 DIAGNOSIS — G9340 Encephalopathy, unspecified: Secondary | ICD-10-CM | POA: Diagnosis not present

## 2015-11-28 DIAGNOSIS — H8113 Benign paroxysmal vertigo, bilateral: Secondary | ICD-10-CM | POA: Diagnosis not present

## 2015-11-28 DIAGNOSIS — R0902 Hypoxemia: Secondary | ICD-10-CM | POA: Diagnosis not present

## 2015-11-28 DIAGNOSIS — R531 Weakness: Secondary | ICD-10-CM | POA: Diagnosis not present

## 2015-11-28 DIAGNOSIS — E039 Hypothyroidism, unspecified: Secondary | ICD-10-CM | POA: Diagnosis not present

## 2015-11-28 DIAGNOSIS — E038 Other specified hypothyroidism: Secondary | ICD-10-CM | POA: Diagnosis not present

## 2015-11-28 DIAGNOSIS — K219 Gastro-esophageal reflux disease without esophagitis: Secondary | ICD-10-CM | POA: Diagnosis not present

## 2015-11-28 DIAGNOSIS — J9600 Acute respiratory failure, unspecified whether with hypoxia or hypercapnia: Secondary | ICD-10-CM | POA: Diagnosis not present

## 2015-11-28 DIAGNOSIS — F329 Major depressive disorder, single episode, unspecified: Secondary | ICD-10-CM | POA: Diagnosis not present

## 2015-11-28 DIAGNOSIS — R29898 Other symptoms and signs involving the musculoskeletal system: Secondary | ICD-10-CM | POA: Diagnosis not present

## 2015-11-28 DIAGNOSIS — I422 Other hypertrophic cardiomyopathy: Secondary | ICD-10-CM | POA: Diagnosis not present

## 2015-11-28 NOTE — Clinical Social Work Placement (Signed)
   CLINICAL SOCIAL WORK PLACEMENT  NOTE  Date:  11/28/2015  Patient Details  Name: Dawn Woods MRN: [id number] Date of Birth: [birth year]  Clinical Social Work is seeking post-discharge placement for this patient at the [place] level of care (*CSW will initial, date and re-position this form in  chart as items are completed):  Yes   Patient/family provided with [place] Work Department's list of facilities offering this level of care within the geographic area requested by the patient (or if unable, by the patient's family).  Yes   Patient/family informed of their freedom to choose among providers that offer the needed level of care, that participate in Medicare, Medicaid or managed care program needed by the patient, have an available bed and are willing to accept the patient.  Yes   Patient/family informed of [place] 's ownership interest in [facility] and [facility], as well as of the fact that they are under no obligation to receive care at these facilities.  PASRR submitted to EDS on 11/26/15     PASRR number received on 11/26/15     Existing PASRR number confirmed on       FL2 transmitted to all facilities in geographic area requested by pt/family on 11/26/15     FL2 transmitted to all facilities within larger geographic area on       Patient informed that his/her managed care company has contracts with or will negotiate with certain facilities, including the following:        Yes   Patient/family informed of bed offers received.  Patient chooses bed at [place]     Physician recommends and patient chooses bed at      Patient to be transferred to [facility] on 11/28/15.  Patient to be transferred to facility by [facility]     Patient family notified on 11/28/15 of transfer.  Name of family member notified:  Daughter     PHYSICIAN Please sign FL2     Additional Comment: Pt /  daughter are in agreement with d/c to [facility] today. [facility] transport required. Medical necessity form completed. Pt / daughter are aware out of pocket costs may be associated with [facility] transport. D/C Summary sent to SNF for review. # for report provided to nsg.   _______________________________________________ Luretha Rued, [place] 11/28/2015, 2:34 PM

## 2015-11-28 NOTE — Care Management Important Message (Signed)
Important Message  Patient Details  Name: Dawn Woods MRN: [id number] Date of Birth: 03-18-1930   Medicare Important Message Given:  Yes    Erenest Rasher, RN 11/28/2015, 10:38 AM

## 2015-11-28 NOTE — Progress Notes (Signed)
S: doing well, no complaints.  Filed Vitals:   11/27/15 2156 11/28/15 0515  BP: 140/97 136/69  Pulse: 72 75  Temp: 97.7 F (36.5 C) 97.6 F (36.4 C)  Resp: 16 16   General:  Appears comfortable, calm. Cardiovascular: Regular rate and rhythm, 2/6 murmur; no rub or gallop.  Respiratory: Clear to auscultation bilaterally, no wheezes, rales or rhonchi. Normal respiratory effort. Psychiatric: grossly normal mood and affect, speech fluent and appropriate   A/P No new issues. D/c to SNF today.  [name], MD [facility] [phone]

## 2015-11-29 LAB — CULTURE, BLOOD (ROUTINE X 2)
Culture: NO GROWTH
Culture: NO GROWTH

## 2015-11-30 ENCOUNTER — Non-Acute Institutional Stay (SKILLED_NURSING_FACILITY): Payer: Medicare Other | Admitting: Internal Medicine

## 2015-11-30 ENCOUNTER — Encounter: Payer: Self-pay | Admitting: Internal Medicine

## 2015-11-30 DIAGNOSIS — G9340 Encephalopathy, unspecified: Secondary | ICD-10-CM

## 2015-11-30 DIAGNOSIS — N390 Urinary tract infection, site not specified: Secondary | ICD-10-CM

## 2015-11-30 DIAGNOSIS — F0390 Unspecified dementia without behavioral disturbance: Secondary | ICD-10-CM | POA: Diagnosis not present

## 2015-11-30 DIAGNOSIS — J40 Bronchitis, not specified as acute or chronic: Secondary | ICD-10-CM | POA: Diagnosis not present

## 2015-11-30 DIAGNOSIS — R29898 Other symptoms and signs involving the musculoskeletal system: Secondary | ICD-10-CM

## 2015-11-30 DIAGNOSIS — I421 Obstructive hypertrophic cardiomyopathy: Secondary | ICD-10-CM

## 2015-11-30 DIAGNOSIS — E034 Atrophy of thyroid (acquired): Secondary | ICD-10-CM

## 2015-11-30 DIAGNOSIS — F329 Major depressive disorder, single episode, unspecified: Secondary | ICD-10-CM

## 2015-11-30 DIAGNOSIS — H8113 Benign paroxysmal vertigo, bilateral: Secondary | ICD-10-CM

## 2015-11-30 DIAGNOSIS — E038 Other specified hypothyroidism: Secondary | ICD-10-CM | POA: Diagnosis not present

## 2015-11-30 DIAGNOSIS — F32A Depression, unspecified: Secondary | ICD-10-CM

## 2015-11-30 DIAGNOSIS — E785 Hyperlipidemia, unspecified: Secondary | ICD-10-CM

## 2015-11-30 DIAGNOSIS — J9601 Acute respiratory failure with hypoxia: Secondary | ICD-10-CM | POA: Diagnosis not present

## 2015-11-30 DIAGNOSIS — K219 Gastro-esophageal reflux disease without esophagitis: Secondary | ICD-10-CM

## 2015-11-30 NOTE — Progress Notes (Signed)
MRN: [id number] Name: Dawn Woods  Sex: female Age: 82 y.o. DOB: 08/17/1930  [place] #: Karren Burly Facility/Room:124 Level Of Care: SNF Provider: Inocencio Homes D Emergency Contacts: Extended Emergency Contact Information Primary Emergency Contact: Reginold Agent [place] Phone: [phone] Mobile Phone: [phone] Relation: Daughter  Code Status:   Allergies: Aspirin; Ibuprofen; and Ciprofloxacin  Chief Complaint  Patient presents with  . New Admit To SNF    HPI: Patient is 82 y.o. female who presented to [facility] ED for complaints of generalized weakness, left leg weakness, possible short-term memory loss. Admitted for UTI, acute encephalopathy with nonfocal exam. Pt was admitted to [facility] form 2/27-3/4 where she was treated with empiric antibiotics for UTI/pyelonephritis which rapidly resolved. Reported left leg weakness prior to admission was not demonstrated during hospitalization however extensive investigation was conducted including negative imaging of the hip, lumbar spine. Has done well with physical therapy but rehabilitation has been recommended. Acute encephalopathy likely secondary to acute infection superimposed on dementia, now stable. Hypoxia was noted which was a new finding, after extensive investigation as below this is likely solely related to emphysema. Hospitalization was prolonged by need to investigate left leg weakness as well as hypoxia. She was treated with empiric antibiotics for bronchitis or possibly early pneumonia. She is admitted to SNF for ongoing weakness for OT/PT. While at SNF pt will be followed for HLD, tx with zocor,GERD, tx with protonix 40 mg BID and vertigo, tx with antivert.   Past Medical History  Diagnosis Date  . GERD (gastroesophageal reflux disease)   . Hyperlipidemia   . Hypertension   . Hypothyroidism   . H/O: hematuria 07/2010  . PONV (postoperative nausea and vomiting)     Past Surgical  History  Procedure Laterality Date  . Appendectomy      age 55  . Oophorectomy  1954  . Tonsillectomy      age 70  . Cholecystectomy      age 45  . Dilation and curettage of uterus      multiple's  . Fracture surgery  Dec 2009    left wrist  . Esophagogastroduodenoscopy N/A 08/06/2013    Procedure: ESOPHAGOGASTRODUODENOSCOPY (EGD);  Surgeon: Lear Ng, MD;  Location: [facility];  Service: Endoscopy;  Laterality: N/A;      Medication List       This list is accurate as of: 11/30/15 11:59 PM.  Always use your most recent med list.               acetaminophen 500 MG tablet  Commonly known as:  TYLENOL  Take 1 tablet (500 mg total) by mouth every 6 (six) hours as needed for mild pain.     azithromycin 250 MG tablet  Commonly known as:  ZITHROMAX  Take 1 tablet (250 mg total) by mouth daily. Last dose 3/6 in morning.     cefUROXime 500 MG tablet  Commonly known as:  CEFTIN  Take 1 tablet (500 mg total) by mouth 2 (two) times daily with a meal. Last dose 3/5 in evening.     cetirizine 10 MG tablet  Commonly known as:  ZYRTEC  Take 10 mg by mouth daily.     Cranberry 425 MG Caps  Take 425 mg by mouth 2 (two) times daily.     escitalopram 5 MG tablet  Commonly known as:  LEXAPRO  Take 1 tablet (5 mg total) by mouth daily.  feeding supplement (ENSURE ENLIVE) Liqd  Take 237 mLs by mouth 2 (two) times daily between meals.     ferrous sulfate 325 (65 FE) MG tablet  Take 325 mg by mouth every evening.     levothyroxine 100 MCG tablet  Commonly known as:  SYNTHROID  Take 1 tablet (100 mcg total) by mouth daily before breakfast.     meclizine 25 MG tablet  Commonly known as:  ANTIVERT  Take 25 mg by mouth every 4 (four) hours as needed. Dizziness     metoprolol tartrate 25 MG tablet  Commonly known as:  LOPRESSOR  Take 0.5 tablets (12.5 mg total) by mouth 2 (two) times daily.     mometasone 50 MCG/ACT nasal spray  Commonly known as:  NASONEX  Place  2 sprays into the nose daily.     pantoprazole 40 MG tablet  Commonly known as:  PROTONIX  Take 1 tablet (40 mg total) by mouth 2 (two) times daily.     simvastatin 10 MG tablet  Commonly known as:  ZOCOR  Take 1 tablet (10 mg total) by mouth at bedtime.        No orders of the defined types were placed in this encounter.     There is no immunization history on file for this patient.  Social History  Substance Use Topics  . Smoking status: Former Smoker -- 0.50 packs/day for 63 years    Types: Cigarettes    Quit date: 08/05/2013  . Smokeless tobacco: Never Used  . Alcohol Use: No    Family history is  + thyroid dx  Review of Systems  DATA OBTAINED: from patient, nurse GENERAL:  no fevers, fatigue, appetite changes SKIN: No itching, rash or wounds EYES: No eye pain, redness, discharge EARS: No earache, tinnitus, change in hearing NOSE: No congestion, drainage or bleeding  MOUTH/THROAT: No mouth or tooth pain, No sore throat RESPIRATORY: No cough, wheezing, SOB CARDIAC: No chest pain, palpitations, lower extremity edema  GI: No abdominal pain, No N/V/D or constipation, No heartburn or reflux  GU: No dysuria, frequency or urgency, or incontinence  MUSCULOSKELETAL: No unrelieved bone/joint pain NEUROLOGIC: No headache, dizziness or focal weakness PSYCHIATRIC: No c/o anxiety or sadness   Filed Vitals:   11/30/15 1531  BP: 124/78  Pulse: 57  Temp: 98.1 F (36.7 C)  Resp: 18    SpO2 Readings from Last 1 Encounters:  11/30/15 96%        Physical Exam  GENERAL APPEARANCE: Alert, conversant,  No acute distress.  SKIN: No diaphoresis rash HEAD: Normocephalic, atraumatic  EYES: Conjunctiva/lids clear. Pupils round, reactive. EOMs intact.  EARS: External exam WNL, canals clear. Hearing grossly normal.  NOSE: No deformity or discharge.  MOUTH/THROAT: Lips w/o lesions  RESPIRATORY: Breathing is even, unlabored. Lung sounds are clear   CARDIOVASCULAR: Heart  RRR no murmurs, rubs or gallops. No peripheral edema.   GASTROINTESTINAL: Abdomen is soft, non-tender, not distended w/ normal bowel sounds. GENITOURINARY: Bladder non tender, not distended  MUSCULOSKELETAL: No abnormal joints or musculature NEUROLOGIC:  Cranial nerves 2-12 grossly intact. Moves all extremities  PSYCHIATRIC: Mood and affect appropriate to situation, no behavioral issues  Patient Active Problem List   Diagnosis Date Noted  . Bronchitis 12/02/2015  . Left leg weakness 12/02/2015  . Dementia without behavioral disturbance 12/02/2015  . Hypertrophic obstructive cardiomyopathy ([place]) 12/02/2015  . Depression 12/02/2015  . Malnutrition of moderate degree 11/26/2015  . Acute respiratory failure with hypoxia ([place]) 11/26/2015  .  Acute encephalopathy 11/24/2015  . Hypokalemia 11/24/2015  . Dehydration 11/24/2015  . Generalized weakness 11/24/2015  . UTI (lower urinary tract infection)   . BPV (benign positional vertigo) 08/25/2014  . Melena 08/05/2013  . Personal history of colonic polyps 08/05/2013  . Tobacco abuse 01/04/2012  . UTI 05/19/2010  . Hypothyroidism 04/21/2008  . Dyslipidemia 04/21/2008  . Essential hypertension 04/21/2008  . GERD 04/21/2008    CBC    Component Value Date/Time   WBC 7.4 11/24/2015 0538   RBC 4.97 11/24/2015 0538   HGB 14.7 11/24/2015 0538   HCT 46.0 11/24/2015 0538   PLT 184 11/24/2015 0538   MCV 92.6 11/24/2015 0538   LYMPHSABS 0.5* 11/18/2015 1156   MONOABS 0.7 11/18/2015 1156   EOSABS 0.0 11/18/2015 1156   BASOSABS 0.0 11/18/2015 1156    CMP     Component Value Date/Time   NA 144 11/24/2015 0538   K 3.4* 11/24/2015 0538   CL 105 11/24/2015 0538   CO2 30 11/24/2015 0538   GLUCOSE 102* 11/24/2015 0538   BUN 12 11/24/2015 0538   CREATININE 0.70 11/24/2015 0538   CALCIUM 8.3* 11/24/2015 0538   PROT 7.0 11/24/2015 0538   ALBUMIN 3.6 11/24/2015 0538   AST 34 11/24/2015 0538   ALT 23 11/24/2015 0538   ALKPHOS 68 11/24/2015  0538   BILITOT 0.8 11/24/2015 0538   GFRNONAA >60 11/24/2015 0538   GFRAA >60 11/24/2015 0538    Lab Results  Component Value Date   HGBA1C 5.8* 11/24/2015     Dg Chest 2 View  11/23/2015  CLINICAL DATA:  Weakness and coughing since last week. History of hypertension. EXAM: CHEST  2 VIEW COMPARISON:  08/05/2013 FINDINGS: Cardiac silhouette is mildly enlarged. No mediastinal or hilar masses or evidence of adenopathy. Lungs are hyperexpanded. There are prominent bronchovascular markings. No evidence of pneumonia or pulmonary edema. No pleural effusion or pneumothorax. Bony thorax demineralized but intact. IMPRESSION: No acute cardiopulmonary disease. Electronically Signed   By: Lajean Manes M.D.   On: 11/23/2015 20:56   Dg Lumbar Spine 2-3 Views  11/23/2015  CLINICAL DATA:  Pt c/o left anterior abd pain inguinally s/p fall today, pt seems confused, also c/o weakness. EXAM: LUMBAR SPINE - 2-3 VIEW COMPARISON:  None. FINDINGS: No fracture.  No spondylolisthesis. Moderate loss of disc height at L3-L4-L4-L5 with mild loss of disc height at L5-S1. Bones are diffusely demineralized. There are calcifications along the abdominal aorta. My old focal dilation of the lower abdominal aorta measures approximately 3.2 cm. IMPRESSION: 1. No fracture or acute finding. 2. Lower lumbar spine disc degenerative change. Diffuse bone demineralization. 3. 3.2 cm infrarenal abdominal aortic aneurysm. Recommend followup by ultrasound in 3 years. This recommendation follows ACR consensus guidelines: White Paper of the ACR Incidental Findings Committee II on Vascular Findings. Natasha Mead Coll Radiol 2013; 10:789-794 Electronically Signed   By: Lajean Manes M.D.   On: 11/23/2015 23:29   Ct Head Wo Contrast  11/23/2015  CLINICAL DATA:  82 year old female with altered mental status EXAM: CT HEAD WITHOUT CONTRAST TECHNIQUE: Contiguous axial images were obtained from the base of the skull through the vertex without intravenous  contrast. COMPARISON:  None. FINDINGS: There is no acute intracranial hemorrhage. There is moderate age-related atrophy and chronic microvascular ischemic changes. There is no mass effect or midline shift. There is mild mucoperiosteal thickening of the paranasal sinuses. No air-fluid level. The mastoid air cells are clear. The calvarium is intact. IMPRESSION: No acute intracranial hemorrhage.  Age-related atrophy and chronic microvascular ischemic disease. If symptoms persist and there are no contraindications, MRI may provide better evaluation if clinically indicated. Electronically Signed   By: Anner Crete M.D.   On: 11/23/2015 23:11   Mr Brain Wo Contrast  11/24/2015  CLINICAL DATA:  Acute encephalopathy. EXAM: MRI HEAD WITHOUT CONTRAST TECHNIQUE: Multiplanar, multiecho pulse sequences of the brain and surrounding structures were obtained without intravenous contrast. COMPARISON:  Head CT from yesterday FINDINGS: Calvarium and upper cervical spine: No focal marrow signal abnormality. Orbits: Bilateral cataract resection. Aspherical appearance of the globes may be related to motion on T2 weighted imaging. Sinuses and Mastoids: Clear. Brain: No acute infarct, hemorrhage, hydrocephalus, or mass lesion. No evidence of large vessel occlusion. There is confluent white matter T2 hyperintensity in the cerebral hemispheres, without mass effect or cortical involvement and compatible with extensive chronic small vessel disease given patient's risk factors and age. Patchy small vessel ischemic change also seen in the bilateral pons. Prominent perivascular spaces in the deep gray nuclei and mesial temporal lobes, often seen with chronic hypertension. Motion degraded gradient imaging but no signs of chronic microhemorrhage. Generalized cerebral volume loss, age congruent. IMPRESSION: 1. Motion degraded study without acute finding.  No acute infarct. 2. Extensive chronic small vessel disease. Electronically Signed   By:  Monte Fantasia M.D.   On: 11/24/2015 07:09   Dg Chest Port 1 View  11/24/2015  CLINICAL DATA:  Acute onset of shortness of breath. Initial encounter. EXAM: PORTABLE CHEST 1 VIEW COMPARISON:  Chest radiograph performed 11/23/2015 FINDINGS: The lungs are well-aerated. Vascular congestion is noted. Mild bibasilar opacities could reflect mild interstitial edema. There is no evidence of pleural effusion or pneumothorax. The cardiomediastinal silhouette is borderline normal in size. No acute osseous abnormalities are seen. IMPRESSION: Vascular congestion noted. Mild bibasilar opacities could reflect mild interstitial edema. Electronically Signed   By: Garald Balding M.D.   On: 11/24/2015 05:19   Dg Hip Unilat With Pelvis 2-3 Views Left  11/23/2015  CLINICAL DATA:  Pt c/o left anterior abd pain inguinally s/p fall today, pt seems confused, also c/o weakness. EXAM: DG HIP (WITH OR WITHOUT PELVIS) 2-3V LEFT COMPARISON:  None. FINDINGS: No fracture.  No bone lesion. Hip joints, SI joints and symphysis pubis are normally spaced and aligned. Bones are demineralized. Soft tissues are unremarkable other than vascular calcifications. IMPRESSION: No fracture or acute finding. Electronically Signed   By: Lajean Manes M.D.   On: 11/23/2015 23:30    Not all labs, radiology exams or other studies done during hospitalization come through on my EPIC note; however they are reviewed by me.    Assessment and Plan  Acute respiratory failure with hypoxia (HCC) After extensive investigation further history, this is almost certainly related to severe emphysema as documented by chest CT. Longtime smoker who stopped smoking approximately 3 weeks ago. In addition to emphysema there are also bronchitic changes. There is no evidence to suggest pneumonia although she has been treated empirically with antibiotics. CT angiogram chest negative for PE and lower extremity venous Dopplers are negative. 2-D echocardiogram was unrevealing.  No further evaluation suggested. On further review, hypoxia would explain her presenting symptoms of increased irritability, sleeping and worsening of cognition. SNF - prn O2  Bronchitis And possible PNA ; SNF - pt treated empirically, will finish course of zithromax and ceftin at SNF  UTI (lower urinary tract infection) Tx empirically and resolved prior to admit to SNF.  Left leg weakness with ataxic  gait. MRI brain negative, left hip and lumbar spine films negative. No pain. No weakness on examination. Lumbar spine MRI without acute features to explain symptomology prior to admission. No further evaluation suggested at this time SNF - admit for OT/PT  Acute encephalopathy Superimposed on dementia; acute component is resolved  Dementia without behavioral disturbance SNF - new dx ; will f/u with BIMMs and monitor  Hypertrophic obstructive cardiomyopathy ([place]) New finding since ECHO 07/2013; diovan d/c  And low dose bblocker  SNF - continue metoprolol 12.5 mg BID  Hypothyroidism SNF - TSH was 16.82; synthroid was inc to 100 mcg daily; will cont and f/u TSH in 4 weeks  Depression SNF - new dx; daughter thinks Mom depresses;will cont lexapro 5 mg started in hospital  Dyslipidemia SNF - last LDL 89 AND hdl 58; CONT zocor 10 mg daily  BPV (benign positional vertigo) SNF - cont antivert 25 mg q 4 prn  GERD SNF - not stated as unstable; cont protonix 40 mg BID   Time spent > 45 min;> 50% of time with patient was spent reviewing records, labs, tests and studies, counseling and developing plan of care  Hennie Duos, MD

## 2015-12-01 LAB — BASIC METABOLIC PANEL
BUN: 17 mg/dL (ref 4–21)
CREATININE: 0.6 mg/dL (ref 0.5–1.1)
Glucose: 100 mg/dL
Potassium: 4 mmol/L (ref 3.4–5.3)
Sodium: 141 mmol/L (ref 137–147)

## 2015-12-01 LAB — HEPATIC FUNCTION PANEL
ALT: 12 U/L (ref 7–35)
AST: 16 U/L (ref 13–35)
Alkaline Phosphatase: 66 U/L (ref 25–125)
Bilirubin, Total: 0.7 mg/dL

## 2015-12-01 LAB — CBC AND DIFFERENTIAL
HCT: 41 % (ref 36–46)
Hemoglobin: 13 g/dL (ref 12.0–16.0)
PLATELETS: 363 10*3/uL (ref 150–399)
WBC: 8.4 10*3/mL

## 2015-12-02 DIAGNOSIS — F0390 Unspecified dementia without behavioral disturbance: Secondary | ICD-10-CM | POA: Insufficient documentation

## 2015-12-02 DIAGNOSIS — I421 Obstructive hypertrophic cardiomyopathy: Secondary | ICD-10-CM | POA: Insufficient documentation

## 2015-12-02 DIAGNOSIS — J40 Bronchitis, not specified as acute or chronic: Secondary | ICD-10-CM | POA: Insufficient documentation

## 2015-12-02 DIAGNOSIS — F03918 Unspecified dementia, unspecified severity, with other behavioral disturbance: Secondary | ICD-10-CM | POA: Insufficient documentation

## 2015-12-02 DIAGNOSIS — R29898 Other symptoms and signs involving the musculoskeletal system: Secondary | ICD-10-CM | POA: Insufficient documentation

## 2015-12-02 DIAGNOSIS — F0391 Unspecified dementia with behavioral disturbance: Secondary | ICD-10-CM | POA: Insufficient documentation

## 2015-12-02 DIAGNOSIS — F32A Depression, unspecified: Secondary | ICD-10-CM | POA: Insufficient documentation

## 2015-12-02 DIAGNOSIS — F329 Major depressive disorder, single episode, unspecified: Secondary | ICD-10-CM | POA: Insufficient documentation

## 2015-12-02 NOTE — Assessment & Plan Note (Signed)
SNF - last LDL 89 AND hdl 58; CONT zocor 10 mg daily

## 2015-12-02 NOTE — Assessment & Plan Note (Signed)
Superimposed on dementia; acute component is resolved

## 2015-12-02 NOTE — Assessment & Plan Note (Signed)
Tx empirically and resolved prior to admit to SNF.

## 2015-12-02 NOTE — Assessment & Plan Note (Signed)
SNF - TSH was 16.82; synthroid was inc to 100 mcg daily; will cont and f/u TSH in 4 weeks

## 2015-12-02 NOTE — Assessment & Plan Note (Signed)
SNF - new dx; daughter thinks Mom depresses;will cont lexapro 5 mg started in hospital

## 2015-12-02 NOTE — Assessment & Plan Note (Signed)
After extensive investigation further history, this is almost certainly related to severe emphysema as documented by chest CT. Longtime smoker who stopped smoking approximately 3 weeks ago. In addition to emphysema there are also bronchitic changes. There is no evidence to suggest pneumonia although she has been treated empirically with antibiotics. CT angiogram chest negative for PE and lower extremity venous Dopplers are negative. 2-D echocardiogram was unrevealing. No further evaluation suggested. On further review, hypoxia would explain her presenting symptoms of increased irritability, sleeping and worsening of cognition. SNF - prn O2

## 2015-12-02 NOTE — Assessment & Plan Note (Signed)
with ataxic gait. MRI brain negative, left hip and lumbar spine films negative. No pain. No weakness on examination. Lumbar spine MRI without acute features to explain symptomology prior to admission. No further evaluation suggested at this time SNF - admit for OT/PT

## 2015-12-02 NOTE — Assessment & Plan Note (Signed)
SNF - cont antivert 25 mg q 4 prn

## 2015-12-02 NOTE — Assessment & Plan Note (Signed)
New finding since ECHO 07/2013; diovan d/c  And low dose bblocker  SNF - continue metoprolol 12.5 mg BID

## 2015-12-02 NOTE — Assessment & Plan Note (Signed)
And possible PNA ; SNF - pt treated empirically, will finish course of zithromax and ceftin at [facility]

## 2015-12-02 NOTE — Assessment & Plan Note (Signed)
SNF - new dx ; will f/u with BIMMs and monitor

## 2015-12-02 NOTE — Assessment & Plan Note (Signed)
SNF - not stated as unstable; cont protonix 40 mg BID

## 2016-01-05 ENCOUNTER — Encounter: Payer: Self-pay | Admitting: Adult Health

## 2016-01-05 ENCOUNTER — Non-Acute Institutional Stay (SKILLED_NURSING_FACILITY): Payer: Medicare Other | Admitting: Adult Health

## 2016-01-05 DIAGNOSIS — I10 Essential (primary) hypertension: Secondary | ICD-10-CM | POA: Diagnosis not present

## 2016-01-05 DIAGNOSIS — F329 Major depressive disorder, single episode, unspecified: Secondary | ICD-10-CM

## 2016-01-05 DIAGNOSIS — K219 Gastro-esophageal reflux disease without esophagitis: Secondary | ICD-10-CM

## 2016-01-05 DIAGNOSIS — E038 Other specified hypothyroidism: Secondary | ICD-10-CM

## 2016-01-05 DIAGNOSIS — I421 Obstructive hypertrophic cardiomyopathy: Secondary | ICD-10-CM | POA: Diagnosis not present

## 2016-01-05 DIAGNOSIS — E034 Atrophy of thyroid (acquired): Secondary | ICD-10-CM

## 2016-01-05 DIAGNOSIS — E785 Hyperlipidemia, unspecified: Secondary | ICD-10-CM | POA: Diagnosis not present

## 2016-01-05 DIAGNOSIS — N390 Urinary tract infection, site not specified: Secondary | ICD-10-CM

## 2016-01-05 DIAGNOSIS — F0390 Unspecified dementia without behavioral disturbance: Secondary | ICD-10-CM | POA: Diagnosis not present

## 2016-01-05 DIAGNOSIS — F32A Depression, unspecified: Secondary | ICD-10-CM

## 2016-01-05 NOTE — Progress Notes (Signed)
Patient ID: Dawn Woods, female   DOB: 20-Mar-1930, 82 y.o.   MRN: [id number]   Facility:  [facility]       Allergies  Allergen Reactions  . Aspirin Other (See Comments)    GI Bleed  . Ibuprofen Other (See Comments)    GI Bleed  . Ciprofloxacin     REACTION: hives    Chief Complaint  Patient presents with  . Medical Management of Chronic Issues    Follow up    HPI:  She is a resident of this facility being seen for the management of her chronic illnesses. Overall she is doing well. She is not voicing any concerns or complaints at this time. There are no nursing concerns at this time.    Past Medical History  Diagnosis Date  . GERD (gastroesophageal reflux disease)   . Hyperlipidemia   . Hypertension   . Hypothyroidism   . H/O: hematuria 07/2010  . PONV (postoperative nausea and vomiting)     Past Surgical History  Procedure Laterality Date  . Appendectomy      age 19  . Oophorectomy  1954  . Tonsillectomy      age 69  . Cholecystectomy      age 80  . Dilation and curettage of uterus      multiple's  . Fracture surgery  Dec 2009    left wrist  . Esophagogastroduodenoscopy N/A 08/06/2013    Procedure: ESOPHAGOGASTRODUODENOSCOPY (EGD);  Surgeon: Lear Ng, MD;  Location: [facility];  Service: Endoscopy;  Laterality: N/A;    VITAL SIGNS BP 140/75 mmHg  Pulse 70  Temp(Src) 97 F (36.1 C) (Oral)  Resp 20  Ht 5\' 6"  (1.676 m)  Wt 149 lb 4 oz (67.699 kg)  BMI 24.10 kg/m2  SpO2 98%  Patient's Medications  New Prescriptions   No medications on file  Previous Medications   ACETAMINOPHEN (TYLENOL) 500 MG TABLET    Take 1 tablet (500 mg total) by mouth every 6 (six) hours as needed for mild pain.   CETIRIZINE (ZYRTEC) 10 MG TABLET    Take 10 mg by mouth daily.   CRANBERRY 425 MG CAPS    Take 425 mg by mouth 2 (two) times daily.   ESCITALOPRAM (LEXAPRO) 5 MG TABLET    Take 1 tablet (5 mg total) by mouth daily.   FEEDING SUPPLEMENT, ENSURE  ENLIVE, (ENSURE ENLIVE) LIQD    Take 237 mLs by mouth 2 (two) times daily between meals.   FERROUS SULFATE 325 (65 FE) MG TABLET    Take 325 mg by mouth every evening.    GUAIFENESIN-CODEINE (ROBITUSSIN AC) 100-10 MG/5ML SYRUP    Take 10 mLs by mouth 4 (four) times daily as needed for cough.   LEVOTHYROXINE (SYNTHROID, LEVOTHROID) 100 MCG TABLET    Take 1 tablet (100 mcg total) by mouth daily before breakfast.   MECLIZINE (ANTIVERT) 25 MG TABLET    Take 25 mg by mouth every 4 (four) hours as needed. Dizziness   METOPROLOL TARTRATE (LOPRESSOR) 25 MG TABLET    Take 0.5 tablets (12.5 mg total) by mouth 2 (two) times daily.   MOMETASONE (NASONEX) 50 MCG/ACT NASAL SPRAY    Place 2 sprays into the nose daily.   PANTOPRAZOLE (PROTONIX) 40 MG TABLET    Take 1 tablet (40 mg total) by mouth 2 (two) times daily.   SIMVASTATIN (ZOCOR) 10 MG TABLET    Take 1 tablet (10 mg total) by mouth at bedtime.  Modified Medications  No medications on file  Discontinued Medications     SIGNIFICANT DIAGNOSTIC EXAMS   11-25-15: mri lumbar spine; 1. Moderate left and mild right foraminal narrowing at L2-3. 2. Mild left foraminal stenosis at L3-4. 3. Leftward disc protrusion at L4-5 without significant stenosis. 4. In addition to the acquired spinal stenosis, congenitally short pedicles are noted. 5. Borderline aneurysmal dilation of the infrarenal abdominal aorta and left common iliac artery. 6. Extensive atherosclerotic changes throughout the visible aorta and branch vessels.  11-26-15: ct angio of chest: 1. No pulmonary embolus. 2. No mediastinal hematoma or adenopathy. 3. Tiny anterior pericardial effusion measures 3 mm thickness. 4. Atherosclerotic calcifications of thoracic and upper abdominal aorta. Atherosclerotic calcifications of coronary arteries. 5. Bilateral emphysematous changes are noted most extensive centrilobular emphysematous changes in upper lobes. 6. Bilateral lower lobe bronchitic changes are  noted. There is small patchy atelectasis or infiltrate in left base posteriorly. Only tiny focus of subtle infiltrate is noted in right lower lobe. No pneumothorax. 7. Osteopenia and degenerative changes thoracic spine.  11-26-15: 2-d echo: - Left ventricle: The cavity size was normal. There was moderate asymmetric hypertrophy of the septum. The appearance was consistent with hypertrophic cardiomyopathy. Systolic function was vigorous. The estimated ejection fraction was in the range of 65% to 70%. There was dynamic obstruction at restin the outflow tract.  Wall motion was normal; there were no regional wall motion abnormalities. Doppler parameters are consistent with abnormal left ventricular relaxation (grade 1 diastolic dysfunction). - Mitral valve: There was moderate systolic anterior motion of the anterior leaflet. There was moderate regurgitation. - Pericardium, extracardiac: A small to moderate pericardial effusion was identified anterior to the heart. There was no evidence of hemodynamic compromise. Impressions: - Findings are consistent with hypertrophic obstructive cardiomyopathy.    LABS REVIEWED:   11-24-15: wbc 7.4; hgb 14.7; hct 46.0; mcv 92.6; plt 184; glucose 102; bun 17; creat 0.70; k+ 3.4; na++144; liver normal albumin 3.6; phos 2.1; mag 1.7; hb a1c 5.8; tsh 16.882  12-01-15: wbc 8.4; hgb 13.0; hct 40.8; mcv 88.8; plt 363; glucose 100; bun 16.9; creat 0.64; k+ 4.0; na++141; liver normal albumin 3.3    Review of Systems  Constitutional: Negative for malaise/fatigue.  Respiratory: Negative for cough and shortness of breath.   Cardiovascular: Negative for chest pain, palpitations and leg swelling.  Gastrointestinal: Negative for heartburn, abdominal pain and constipation.  Musculoskeletal: Negative for myalgias, back pain and joint pain.  Skin: Negative.   Neurological: Negative for dizziness.  Psychiatric/Behavioral: The patient is not nervous/anxious.     Physical Exam    Constitutional: She is oriented to person, place, and time. No distress.  Eyes: Conjunctivae are normal.  Neck: Neck supple. No JVD present. No thyromegaly present.  Cardiovascular: Normal rate, regular rhythm and intact distal pulses.   Respiratory: Effort normal and breath sounds normal. No respiratory distress. She has no wheezes.  GI: Soft. Bowel sounds are normal. She exhibits no distension. There is no tenderness.  Musculoskeletal: She exhibits no edema.  Able to move all extremities   Lymphadenopathy:    She has no cervical adenopathy.  Neurological: She is alert and oriented to person, place, and time.  Skin: Skin is warm and dry. She is not diaphoretic.  Psychiatric: She has a normal mood and affect.     ASSESSMENT/ PLAN:  1. Hypothyroidism: will continue synthroid 100 mcg daily tsh is 16.882; will check tsh   2. Hypertension: will continue lopressor 12.5 mg twice daily  3. Anemia:  hgb is 13.0; will continue iron daily   4. Gerd: will continue protonix 40 mg twice daily   5. Dyslipidemia: will continue zocor 10 mg daily   6. Depression: will continue lexapro 5 mg daily  7. UTI: no recent infection will continue cranberry supplement  8. Allergic rhinitis: nasonex daily and zyrtec daily   9. Hypertrophic obstructive cardiomyopathy: ef is 65-70%; will monitor is on lopressor 12.5 mg twice daily   10. Vascular dementia: no change in status; is presently not on medications; will monitor      Ok Edwards NP [facility]  Contact [phone] Monday through Friday 8am- 5pm  After hours call [phone]

## 2016-01-11 ENCOUNTER — Non-Acute Institutional Stay (SKILLED_NURSING_FACILITY): Payer: Medicare Other | Admitting: Internal Medicine

## 2016-01-11 DIAGNOSIS — J40 Bronchitis, not specified as acute or chronic: Secondary | ICD-10-CM

## 2016-01-11 DIAGNOSIS — R531 Weakness: Secondary | ICD-10-CM | POA: Diagnosis not present

## 2016-01-11 DIAGNOSIS — I421 Obstructive hypertrophic cardiomyopathy: Secondary | ICD-10-CM

## 2016-01-11 DIAGNOSIS — I10 Essential (primary) hypertension: Secondary | ICD-10-CM | POA: Diagnosis not present

## 2016-01-11 DIAGNOSIS — K219 Gastro-esophageal reflux disease without esophagitis: Secondary | ICD-10-CM | POA: Diagnosis not present

## 2016-01-11 DIAGNOSIS — J9601 Acute respiratory failure with hypoxia: Secondary | ICD-10-CM

## 2016-01-11 DIAGNOSIS — E440 Moderate protein-calorie malnutrition: Secondary | ICD-10-CM

## 2016-01-11 DIAGNOSIS — R29898 Other symptoms and signs involving the musculoskeletal system: Secondary | ICD-10-CM | POA: Diagnosis not present

## 2016-01-11 DIAGNOSIS — F32A Depression, unspecified: Secondary | ICD-10-CM

## 2016-01-11 DIAGNOSIS — F0390 Unspecified dementia without behavioral disturbance: Secondary | ICD-10-CM

## 2016-01-11 DIAGNOSIS — Z720 Tobacco use: Secondary | ICD-10-CM

## 2016-01-11 DIAGNOSIS — H8113 Benign paroxysmal vertigo, bilateral: Secondary | ICD-10-CM

## 2016-01-11 DIAGNOSIS — E785 Hyperlipidemia, unspecified: Secondary | ICD-10-CM

## 2016-01-11 DIAGNOSIS — N390 Urinary tract infection, site not specified: Secondary | ICD-10-CM

## 2016-01-11 DIAGNOSIS — F329 Major depressive disorder, single episode, unspecified: Secondary | ICD-10-CM

## 2016-01-11 DIAGNOSIS — E038 Other specified hypothyroidism: Secondary | ICD-10-CM | POA: Diagnosis not present

## 2016-01-11 DIAGNOSIS — E034 Atrophy of thyroid (acquired): Secondary | ICD-10-CM

## 2016-01-11 NOTE — Progress Notes (Signed)
MRN: [id number] Name: Dawn Woods  Sex: female Age: 82 y.o. DOB: 24-Jun-1930  [place] #: Karren Burly Facility/Room: Level Of Care: SNF Provider: Inocencio Homes  MD Emergency Contacts: Extended Emergency Contact Information Primary Emergency Contact: Raeanne Barry of [place] Phone: [phone] Mobile Phone: [phone] Relation: Daughter  Code Status:   Allergies: Aspirin; Ibuprofen; and Ciprofloxacin  Chief Complaint  Patient presents with  . Discharge Note    HPI: Patient is 82 y.o. female who presented to [facility] ED for complaints of generalized weakness, left leg weakness, possible short-term memory loss. Admitted for UTI, acute encephalopathy with nonfocal exam. Pt was admitted to [facility] form 2/27-3/4 where she was treated with empiric antibiotics for UTI/pyelonephritis which rapidly resolved. Reported left leg weakness prior to admission was not demonstrated during hospitalization however extensive investigation was conducted including negative imaging of the hip, lumbar spine. Has done well with physical therapy but rehabilitation has been recommended. Acute encephalopathy likely secondary to acute infection superimposed on dementia, now stable. Hypoxia was noted which was a new finding, after extensive investigation as below this is likely solely related to emphysema. Hospitalization was prolonged by need to investigate left leg weakness as well as hypoxia. She was treated with empiric antibiotics for bronchitis or possibly early pneumonia. She is admitted to SNF for ongoing weakness for OT/PT. Pt is now ready to be d/c to home.  Past Medical History  Diagnosis Date  . GERD (gastroesophageal reflux disease)   . Hyperlipidemia   . Hypertension   . Hypothyroidism   . H/O: hematuria 07/2010  . PONV (postoperative nausea and vomiting)     Past Surgical History  Procedure Laterality Date  . Appendectomy      age 22  . Oophorectomy  1954  .  Tonsillectomy      age 65  . Cholecystectomy      age 38  . Dilation and curettage of uterus      multiple's  . Fracture surgery  Dec 2009    left wrist  . Esophagogastroduodenoscopy N/A 08/06/2013    Procedure: ESOPHAGOGASTRODUODENOSCOPY (EGD);  Surgeon: Lear Ng, MD;  Location: [facility];  Service: Endoscopy;  Laterality: N/A;      Medication List       This list is accurate as of: 01/11/16 11:59 PM.  Always use your most recent med list.               acetaminophen 500 MG tablet  Commonly known as:  TYLENOL  Take 1 tablet (500 mg total) by mouth every 6 (six) hours as needed for mild pain.     cetirizine 10 MG tablet  Commonly known as:  ZYRTEC  Take 10 mg by mouth daily.     Cranberry 425 MG Caps  Take 425 mg by mouth 2 (two) times daily.     feeding supplement (ENSURE ENLIVE) Liqd  Take 237 mLs by mouth 2 (two) times daily between meals.     ferrous sulfate 325 (65 FE) MG tablet  Take 325 mg by mouth every evening.     guaiFENesin-codeine 100-10 MG/5ML syrup  Commonly known as:  ROBITUSSIN AC  Take 10 mLs by mouth 4 (four) times daily as needed for cough.     levothyroxine 100 MCG tablet  Commonly known as:  SYNTHROID  Take 1 tablet (100 mcg total) by mouth daily before breakfast.     meclizine 25 MG tablet  Commonly known  as:  ANTIVERT  Take 25 mg by mouth every 4 (four) hours as needed. Dizziness     metoprolol tartrate 25 MG tablet  Commonly known as:  LOPRESSOR  Take 0.5 tablets (12.5 mg total) by mouth 2 (two) times daily.     mometasone 50 MCG/ACT nasal spray  Commonly known as:  NASONEX  Place 2 sprays into the nose daily.     pantoprazole 40 MG tablet  Commonly known as:  PROTONIX  Take 1 tablet (40 mg total) by mouth 2 (two) times daily.     simvastatin 10 MG tablet  Commonly known as:  ZOCOR  Take 1 tablet (10 mg total) by mouth at bedtime.        No orders of the defined types were placed in this encounter.      There is no immunization history on file for this patient.  Social History  Substance Use Topics  . Smoking status: Former Smoker -- 0.50 packs/day for 63 years    Types: Cigarettes    Quit date: 08/05/2013  . Smokeless tobacco: Never Used  . Alcohol Use: No    Filed Vitals:   01/11/16 1255  BP: 124/78  Pulse: 57  Temp: 98.1 F (36.7 C)  Resp: 18    Physical Exam  GENERAL APPEARANCE: Alert, conversant. No acute distress.  HEENT: Unremarkable. RESPIRATORY: Breathing is even, unlabored. Lung sounds are clear   CARDIOVASCULAR: Heart RRR no murmurs, rubs or gallops. No peripheral edema.  GASTROINTESTINAL: Abdomen is soft, non-tender, not distended w/ normal bowel sounds.  NEUROLOGIC: Cranial nerves 2-12 grossly intact. Moves all extremities  Patient Active Problem List   Diagnosis Date Noted  . Bronchitis 12/02/2015  . Left leg weakness 12/02/2015  . Dementia without behavioral disturbance 12/02/2015  . Hypertrophic obstructive cardiomyopathy ([place]) 12/02/2015  . Depression 12/02/2015  . Malnutrition of moderate degree 11/26/2015  . Acute respiratory failure with hypoxia ([place]) 11/26/2015  . Acute encephalopathy 11/24/2015  . Hypokalemia 11/24/2015  . Dehydration 11/24/2015  . Generalized weakness 11/24/2015  . UTI (lower urinary tract infection)   . BPV (benign positional vertigo) 08/25/2014  . Melena 08/05/2013  . Personal history of colonic polyps 08/05/2013  . Tobacco abuse 01/04/2012  . UTI 05/19/2010  . Hypothyroidism 04/21/2008  . Dyslipidemia 04/21/2008  . Essential hypertension 04/21/2008  . GERD 04/21/2008    CBC    Component Value Date/Time   WBC 8.4 12/01/2015   WBC 7.4 11/24/2015 0538   RBC 4.97 11/24/2015 0538   HGB 13.0 12/01/2015   HCT 41 12/01/2015   PLT 363 12/01/2015   MCV 92.6 11/24/2015 0538   LYMPHSABS 0.5* 11/18/2015 1156   MONOABS 0.7 11/18/2015 1156   EOSABS 0.0 11/18/2015 1156   BASOSABS 0.0 11/18/2015 1156    CMP      Component Value Date/Time   NA 141 12/01/2015   NA 144 11/24/2015 0538   K 4.0 12/01/2015   CL 105 11/24/2015 0538   CO2 30 11/24/2015 0538   GLUCOSE 102* 11/24/2015 0538   BUN 17 12/01/2015   BUN 12 11/24/2015 0538   CREATININE 0.6 12/01/2015   CREATININE 0.70 11/24/2015 0538   CALCIUM 8.3* 11/24/2015 0538   PROT 7.0 11/24/2015 0538   ALBUMIN 3.6 11/24/2015 0538   AST 16 12/01/2015   ALT 12 12/01/2015   ALKPHOS 66 12/01/2015   BILITOT 0.8 11/24/2015 0538   GFRNONAA >60 11/24/2015 0538   GFRAA >60 11/24/2015 0538    Assessment and Plan  Pt is d/c to home with HH/OT/PT. Medications have been reconciled and rx's written.  Inocencio Homes  MD

## 2016-01-13 ENCOUNTER — Telehealth: Payer: Self-pay | Admitting: Internal Medicine

## 2016-01-13 NOTE — Telephone Encounter (Signed)
Daughter called to advise pt has been moved to [facility] assisted memory care unit. They have decided to use their doctors there. Too complicated to transport pt back and forth. Appreciate all Dr Raliegh Ip has done.

## 2016-01-14 DIAGNOSIS — K769 Liver disease, unspecified: Secondary | ICD-10-CM | POA: Diagnosis not present

## 2016-01-14 DIAGNOSIS — E039 Hypothyroidism, unspecified: Secondary | ICD-10-CM | POA: Diagnosis not present

## 2016-01-14 DIAGNOSIS — I10 Essential (primary) hypertension: Secondary | ICD-10-CM | POA: Diagnosis not present

## 2016-01-14 DIAGNOSIS — E7800 Pure hypercholesterolemia, unspecified: Secondary | ICD-10-CM | POA: Diagnosis not present

## 2016-01-14 DIAGNOSIS — E559 Vitamin D deficiency, unspecified: Secondary | ICD-10-CM | POA: Diagnosis not present

## 2016-01-14 DIAGNOSIS — E538 Deficiency of other specified B group vitamins: Secondary | ICD-10-CM | POA: Diagnosis not present

## 2016-01-14 DIAGNOSIS — D649 Anemia, unspecified: Secondary | ICD-10-CM | POA: Diagnosis not present

## 2016-01-14 NOTE — Telephone Encounter (Signed)
FYI

## 2016-01-18 DIAGNOSIS — R278 Other lack of coordination: Secondary | ICD-10-CM | POA: Diagnosis not present

## 2016-01-18 DIAGNOSIS — R2689 Other abnormalities of gait and mobility: Secondary | ICD-10-CM | POA: Diagnosis not present

## 2016-01-18 DIAGNOSIS — Z9181 History of falling: Secondary | ICD-10-CM | POA: Diagnosis not present

## 2016-01-18 DIAGNOSIS — M6281 Muscle weakness (generalized): Secondary | ICD-10-CM | POA: Diagnosis not present

## 2016-01-19 ENCOUNTER — Encounter: Payer: Self-pay | Admitting: Internal Medicine

## 2016-01-19 DIAGNOSIS — Z9181 History of falling: Secondary | ICD-10-CM | POA: Diagnosis not present

## 2016-01-19 DIAGNOSIS — R2689 Other abnormalities of gait and mobility: Secondary | ICD-10-CM | POA: Diagnosis not present

## 2016-01-19 DIAGNOSIS — M6281 Muscle weakness (generalized): Secondary | ICD-10-CM | POA: Diagnosis not present

## 2016-01-19 DIAGNOSIS — R278 Other lack of coordination: Secondary | ICD-10-CM | POA: Diagnosis not present

## 2016-01-20 DIAGNOSIS — Z9181 History of falling: Secondary | ICD-10-CM | POA: Diagnosis not present

## 2016-01-20 DIAGNOSIS — E039 Hypothyroidism, unspecified: Secondary | ICD-10-CM | POA: Diagnosis not present

## 2016-01-20 DIAGNOSIS — E785 Hyperlipidemia, unspecified: Secondary | ICD-10-CM | POA: Diagnosis not present

## 2016-01-20 DIAGNOSIS — I10 Essential (primary) hypertension: Secondary | ICD-10-CM | POA: Diagnosis not present

## 2016-01-20 DIAGNOSIS — R278 Other lack of coordination: Secondary | ICD-10-CM | POA: Diagnosis not present

## 2016-01-20 DIAGNOSIS — R2689 Other abnormalities of gait and mobility: Secondary | ICD-10-CM | POA: Diagnosis not present

## 2016-01-20 DIAGNOSIS — N390 Urinary tract infection, site not specified: Secondary | ICD-10-CM | POA: Diagnosis not present

## 2016-01-20 DIAGNOSIS — M6281 Muscle weakness (generalized): Secondary | ICD-10-CM | POA: Diagnosis not present

## 2016-01-21 DIAGNOSIS — Z9181 History of falling: Secondary | ICD-10-CM | POA: Diagnosis not present

## 2016-01-21 DIAGNOSIS — R278 Other lack of coordination: Secondary | ICD-10-CM | POA: Diagnosis not present

## 2016-01-21 DIAGNOSIS — M6281 Muscle weakness (generalized): Secondary | ICD-10-CM | POA: Diagnosis not present

## 2016-01-21 DIAGNOSIS — R2689 Other abnormalities of gait and mobility: Secondary | ICD-10-CM | POA: Diagnosis not present

## 2016-02-23 DIAGNOSIS — I10 Essential (primary) hypertension: Secondary | ICD-10-CM | POA: Diagnosis not present

## 2016-04-29 DIAGNOSIS — R710 Precipitous drop in hematocrit: Secondary | ICD-10-CM | POA: Diagnosis not present

## 2016-06-21 DIAGNOSIS — R710 Precipitous drop in hematocrit: Secondary | ICD-10-CM | POA: Diagnosis not present

## 2016-06-24 ENCOUNTER — Other Ambulatory Visit: Payer: Self-pay | Admitting: Internal Medicine

## 2016-07-14 DIAGNOSIS — R0602 Shortness of breath: Secondary | ICD-10-CM | POA: Diagnosis not present

## 2016-07-14 DIAGNOSIS — R0689 Other abnormalities of breathing: Secondary | ICD-10-CM | POA: Diagnosis not present

## 2016-07-14 DIAGNOSIS — E039 Hypothyroidism, unspecified: Secondary | ICD-10-CM | POA: Diagnosis not present

## 2016-07-14 DIAGNOSIS — E785 Hyperlipidemia, unspecified: Secondary | ICD-10-CM | POA: Diagnosis not present

## 2016-07-18 DIAGNOSIS — E039 Hypothyroidism, unspecified: Secondary | ICD-10-CM | POA: Diagnosis not present

## 2016-10-04 DIAGNOSIS — F329 Major depressive disorder, single episode, unspecified: Secondary | ICD-10-CM | POA: Diagnosis not present

## 2016-10-14 DIAGNOSIS — M545 Low back pain: Secondary | ICD-10-CM | POA: Diagnosis not present

## 2016-10-14 DIAGNOSIS — M546 Pain in thoracic spine: Secondary | ICD-10-CM | POA: Diagnosis not present

## 2016-10-15 ENCOUNTER — Emergency Department (HOSPITAL_BASED_OUTPATIENT_CLINIC_OR_DEPARTMENT_OTHER): Payer: Medicare Other

## 2016-10-15 ENCOUNTER — Encounter (HOSPITAL_BASED_OUTPATIENT_CLINIC_OR_DEPARTMENT_OTHER): Payer: Self-pay | Admitting: Emergency Medicine

## 2016-10-15 ENCOUNTER — Inpatient Hospital Stay (HOSPITAL_BASED_OUTPATIENT_CLINIC_OR_DEPARTMENT_OTHER)
Admission: EM | Admit: 2016-10-15 | Discharge: 2016-10-21 | DRG: 640 | Disposition: A | Discharge status: Nursing Home (Includes ICF) | Payer: Medicare Other | Attending: Internal Medicine | Admitting: Internal Medicine

## 2016-10-15 DIAGNOSIS — E039 Hypothyroidism, unspecified: Secondary | ICD-10-CM | POA: Diagnosis present

## 2016-10-15 DIAGNOSIS — I421 Obstructive hypertrophic cardiomyopathy: Secondary | ICD-10-CM | POA: Diagnosis not present

## 2016-10-15 DIAGNOSIS — Z66 Do not resuscitate: Secondary | ICD-10-CM | POA: Diagnosis not present

## 2016-10-15 DIAGNOSIS — F0390 Unspecified dementia without behavioral disturbance: Secondary | ICD-10-CM | POA: Diagnosis present

## 2016-10-15 DIAGNOSIS — R778 Other specified abnormalities of plasma proteins: Secondary | ICD-10-CM | POA: Diagnosis present

## 2016-10-15 DIAGNOSIS — E876 Hypokalemia: Secondary | ICD-10-CM | POA: Diagnosis present

## 2016-10-15 DIAGNOSIS — R7989 Other specified abnormal findings of blood chemistry: Secondary | ICD-10-CM | POA: Diagnosis not present

## 2016-10-15 DIAGNOSIS — Z881 Allergy status to other antibiotic agents status: Secondary | ICD-10-CM

## 2016-10-15 DIAGNOSIS — Z886 Allergy status to analgesic agent status: Secondary | ICD-10-CM

## 2016-10-15 DIAGNOSIS — Z87891 Personal history of nicotine dependence: Secondary | ICD-10-CM

## 2016-10-15 DIAGNOSIS — W19XXXA Unspecified fall, initial encounter: Secondary | ICD-10-CM | POA: Diagnosis present

## 2016-10-15 DIAGNOSIS — I951 Orthostatic hypotension: Secondary | ICD-10-CM | POA: Diagnosis present

## 2016-10-15 DIAGNOSIS — G92 Toxic encephalopathy: Secondary | ICD-10-CM | POA: Diagnosis not present

## 2016-10-15 DIAGNOSIS — N390 Urinary tract infection, site not specified: Secondary | ICD-10-CM | POA: Diagnosis present

## 2016-10-15 DIAGNOSIS — R410 Disorientation, unspecified: Secondary | ICD-10-CM | POA: Diagnosis not present

## 2016-10-15 DIAGNOSIS — Z87440 Personal history of urinary (tract) infections: Secondary | ICD-10-CM

## 2016-10-15 DIAGNOSIS — Z8349 Family history of other endocrine, nutritional and metabolic diseases: Secondary | ICD-10-CM

## 2016-10-15 DIAGNOSIS — G3109 Other frontotemporal dementia: Secondary | ICD-10-CM

## 2016-10-15 DIAGNOSIS — Z09 Encounter for follow-up examination after completed treatment for conditions other than malignant neoplasm: Secondary | ICD-10-CM

## 2016-10-15 DIAGNOSIS — F03918 Unspecified dementia, unspecified severity, with other behavioral disturbance: Secondary | ICD-10-CM | POA: Diagnosis present

## 2016-10-15 DIAGNOSIS — F0280 Dementia in other diseases classified elsewhere without behavioral disturbance: Secondary | ICD-10-CM

## 2016-10-15 DIAGNOSIS — S0990XA Unspecified injury of head, initial encounter: Secondary | ICD-10-CM | POA: Diagnosis not present

## 2016-10-15 DIAGNOSIS — G9340 Encephalopathy, unspecified: Secondary | ICD-10-CM | POA: Diagnosis present

## 2016-10-15 DIAGNOSIS — R748 Abnormal levels of other serum enzymes: Secondary | ICD-10-CM | POA: Diagnosis not present

## 2016-10-15 DIAGNOSIS — E860 Dehydration: Secondary | ICD-10-CM | POA: Diagnosis not present

## 2016-10-15 DIAGNOSIS — B961 Klebsiella pneumoniae [K. pneumoniae] as the cause of diseases classified elsewhere: Secondary | ICD-10-CM | POA: Diagnosis present

## 2016-10-15 DIAGNOSIS — D181 Lymphangioma, any site: Secondary | ICD-10-CM | POA: Diagnosis present

## 2016-10-15 DIAGNOSIS — Z90722 Acquired absence of ovaries, bilateral: Secondary | ICD-10-CM

## 2016-10-15 DIAGNOSIS — F0391 Unspecified dementia with behavioral disturbance: Secondary | ICD-10-CM | POA: Diagnosis present

## 2016-10-15 DIAGNOSIS — E785 Hyperlipidemia, unspecified: Secondary | ICD-10-CM | POA: Diagnosis present

## 2016-10-15 DIAGNOSIS — I248 Other forms of acute ischemic heart disease: Secondary | ICD-10-CM | POA: Diagnosis present

## 2016-10-15 DIAGNOSIS — I10 Essential (primary) hypertension: Secondary | ICD-10-CM | POA: Diagnosis present

## 2016-10-15 DIAGNOSIS — R079 Chest pain, unspecified: Secondary | ICD-10-CM | POA: Diagnosis not present

## 2016-10-15 DIAGNOSIS — K219 Gastro-esophageal reflux disease without esophagitis: Secondary | ICD-10-CM | POA: Diagnosis present

## 2016-10-15 DIAGNOSIS — Z79899 Other long term (current) drug therapy: Secondary | ICD-10-CM

## 2016-10-15 DIAGNOSIS — R001 Bradycardia, unspecified: Secondary | ICD-10-CM | POA: Diagnosis present

## 2016-10-15 LAB — COMPREHENSIVE METABOLIC PANEL
ALBUMIN: 3.8 g/dL (ref 3.5–5.0)
ALT: 21 U/L (ref 14–54)
ANION GAP: 8 (ref 5–15)
AST: 23 U/L (ref 15–41)
Alkaline Phosphatase: 64 U/L (ref 38–126)
BUN: 24 mg/dL — ABNORMAL HIGH (ref 6–20)
CHLORIDE: 99 mmol/L — AB (ref 101–111)
CO2: 32 mmol/L (ref 22–32)
Calcium: 9 mg/dL (ref 8.9–10.3)
Creatinine, Ser: 0.88 mg/dL (ref 0.44–1.00)
GFR calc Af Amer: >60 mL/min (ref >60)
GFR calc non Af Amer: 59 mL/min — ABNORMAL LOW (ref >60)
GLUCOSE: 124 mg/dL — AB (ref 65–99)
POTASSIUM: 3.1 mmol/L — AB (ref 3.5–5.1)
SODIUM: 139 mmol/L (ref 135–145)
TOTAL PROTEIN: 7 g/dL (ref 6.5–8.1)
Total Bilirubin: 1.3 mg/dL — ABNORMAL HIGH (ref 0.3–1.2)

## 2016-10-15 LAB — CBC WITH DIFFERENTIAL/PLATELET
BASOS ABS: 0 10*3/uL (ref 0.0–0.1)
BASOS PCT: 0 %
EOS ABS: 0 10*3/uL (ref 0.0–0.7)
EOS PCT: 0 %
HCT: 46.8 % — ABNORMAL HIGH (ref 36.0–46.0)
Hemoglobin: 15.1 g/dL — ABNORMAL HIGH (ref 12.0–15.0)
Lymphocytes Relative: 14 %
Lymphs Abs: 1.6 10*3/uL (ref 0.7–4.0)
MCH: 29.8 pg (ref 26.0–34.0)
MCHC: 32.3 g/dL (ref 30.0–36.0)
MCV: 92.5 fL (ref 78.0–100.0)
MONO ABS: 1.3 10*3/uL — AB (ref 0.1–1.0)
MONOS PCT: 11 %
Neutro Abs: 8.7 10*3/uL — ABNORMAL HIGH (ref 1.7–7.7)
Neutrophils Relative %: 75 %
PLATELETS: 239 10*3/uL (ref 150–400)
RBC: 5.06 MIL/uL (ref 3.87–5.11)
RDW: 14.3 % (ref 11.5–15.5)
WBC: 11.5 10*3/uL — ABNORMAL HIGH (ref 4.0–10.5)

## 2016-10-15 LAB — URINALYSIS, ROUTINE W REFLEX MICROSCOPIC
GLUCOSE, UA: NEGATIVE mg/dL
Ketones, ur: 15 mg/dL — AB
LEUKOCYTES UA: NEGATIVE
Nitrite: NEGATIVE
SPECIFIC GRAVITY, URINE: 1.025 (ref 1.005–1.030)
pH: 6.5 (ref 5.0–8.0)

## 2016-10-15 LAB — URINALYSIS, MICROSCOPIC (REFLEX)

## 2016-10-15 LAB — I-STAT CG4 LACTIC ACID, ED: LACTIC ACID, VENOUS: 1.73 mmol/L (ref 0.5–1.9)

## 2016-10-15 LAB — TROPONIN I
TROPONIN I: 0.07 ng/mL — AB (ref <0.03)
Troponin I: 0.08 ng/mL (ref <0.03)

## 2016-10-15 LAB — CBG MONITORING, ED: GLUCOSE-CAPILLARY: 107 mg/dL — AB (ref 65–99)

## 2016-10-15 MED ORDER — ONDANSETRON HCL 4 MG/2ML IJ SOLN: 4.0000 mg | Freq: Four times a day (QID) | INTRAMUSCULAR | Status: DC | PRN

## 2016-10-15 MED ORDER — SODIUM CHLORIDE 0.9% FLUSH
3.0000 mL | Freq: Two times a day (BID) | INTRAVENOUS | Status: DC
  Administered 2016-10-15 – 2016-10-21 (×9): 3 mL via INTRAVENOUS

## 2016-10-15 MED ORDER — ACETAMINOPHEN 325 MG PO TABS
ORAL_TABLET | ORAL | Status: AC
Start: 2016-10-15 — End: 2016-10-15
  Administered 2016-10-15: 650 mg via ORAL
  Filled 2016-10-15: qty 2

## 2016-10-15 MED ORDER — HYDROCODONE-ACETAMINOPHEN 5-325 MG PO TABS
1.0000 | ORAL_TABLET | ORAL | Status: DC | PRN
  Filled 2016-10-15: qty 2

## 2016-10-15 MED ORDER — GUAIFENESIN ER 600 MG PO TB12
600.0000 mg | ORAL_TABLET | Freq: Two times a day (BID) | ORAL | Status: DC
  Administered 2016-10-15 – 2016-10-21 (×12): 600 mg via ORAL
  Filled 2016-10-15 (×13): qty 1

## 2016-10-15 MED ORDER — DEXTROSE 5 % IV SOLN
1.0000 g | Freq: Once | INTRAVENOUS | Status: AC
  Administered 2016-10-15: 1 g via INTRAVENOUS
  Filled 2016-10-15: qty 10

## 2016-10-15 MED ORDER — ACETAMINOPHEN 325 MG PO TABS
650.0000 mg | ORAL_TABLET | Freq: Four times a day (QID) | ORAL | Status: DC | PRN
  Administered 2016-10-16 – 2016-10-20 (×4): 650 mg via ORAL
  Filled 2016-10-15 (×4): qty 2

## 2016-10-15 MED ORDER — ONDANSETRON HCL 4 MG PO TABS: 4.0000 mg | ORAL_TABLET | Freq: Four times a day (QID) | ORAL | Status: DC | PRN

## 2016-10-15 MED ORDER — SODIUM CHLORIDE 0.9 % IV SOLN
INTRAVENOUS | Status: AC
  Administered 2016-10-15: 23:00:00 via INTRAVENOUS

## 2016-10-15 MED ORDER — ACETAMINOPHEN 650 MG RE SUPP: 650.0000 mg | Freq: Four times a day (QID) | RECTAL | Status: DC | PRN

## 2016-10-15 MED ORDER — LEVALBUTEROL HCL 0.63 MG/3ML IN NEBU
0.6300 mg | INHALATION_SOLUTION | Freq: Four times a day (QID) | RESPIRATORY_TRACT | Status: DC | PRN
  Administered 2016-10-21: 0.63 mg via RESPIRATORY_TRACT
  Filled 2016-10-15: qty 3

## 2016-10-15 MED ORDER — ACETAMINOPHEN 325 MG PO TABS
650.0000 mg | ORAL_TABLET | Freq: Once | ORAL | Status: AC
  Administered 2016-10-15: 650 mg via ORAL

## 2016-10-15 MED ORDER — POTASSIUM CHLORIDE CRYS ER 20 MEQ PO TBCR
40.0000 meq | EXTENDED_RELEASE_TABLET | Freq: Once | ORAL | Status: AC
  Administered 2016-10-15: 40 meq via ORAL
  Filled 2016-10-15: qty 2

## 2016-10-15 MED ORDER — DEXTROSE 5 % IV SOLN
1.0000 g | INTRAVENOUS | Status: DC
  Administered 2016-10-16: 1 g via INTRAVENOUS
  Filled 2016-10-15 (×2): qty 10

## 2016-10-15 NOTE — ED Triage Notes (Addendum)
Patient family reports that the patient had an unwitnessed fall on Monday states that she fell on Monday and hit her head and ems evaluated her and she was fine. The patient's family reports that she was fine until last night when she started have altered mental status. The patient has a history of dementia but has had trouble with memories that she can usually recall. She also has been pacing at night and "not her self" - Patient is a patient at [facility] and the nurses were concerned

## 2016-10-15 NOTE — H&P (Signed)
Dawn Woods [id number] DOB: 24-Aug-1930 DOA: 10/15/2016     PCP: [name], MD   Outpatient Specialists: none  Patient coming from: From facility [facility] memory unit  Chief Complaint: confusion  HPI: Dawn Woods is a 83 y.o. female with medical history significant of Dementia, hypertrophic obstructive cardiomyopathy, hypothyroidism history of hypertension now resolved, GERD    Presented with worsening confusion since last night, she lives in assisted living and have been unable to recognize familiar faces asking for breakfast in the middle of the night. She fell on Tuesday 16th was found down. Was worrisome if she hit her head or at least face since she had abrasions to her face. No history of being on blood thinners She refused to to to ER. She's been reporting some back pain Her vitals were monitored and have been stable.  She have had xrays of her back no fractures were noted.  Patient have denied any chest pain when questioning she states "I am fine" Family denies any fever, she is prone to UTI's in the past in February patient have had similar admission and was found to have UTI.  Nursing staff was concerned patient was taken to [facility]   Regarding pertinent Chronic problems: She has dementia at baseline but at baseline recognizes people around her.    IN ER:  Temp (24hrs), Avg:97.9 F (36.6 C), Min:97.6 F (36.4 C), Max:98.4 F (36.9 C)   RR 22 satting 94% on 2 L heart rate 84 BP 158/99 Lactic acid 1.73 ABC 11.5 hemoglobin 15.1 Calcium 3.1 BUN 24 creatinine 0.88 Troponin 0.08  Patient was noted to have a regular rhythm has been discussed with cardiology who felt there was bigeminy she was noted to have mild troponin elevation at 0.08 denied any chest pain. Found to have possible UTI and started on antibiotics Chest x-ray showing COPD but no acute disease  CT head nonacute but showing small bilateral diffuse subdural hygroma is  mildly increased from prior.  Following Medications were ordered in ER: Medications  cefTRIAXone (ROCEPHIN) 1 g in dextrose 5 % 50 mL IVPB (not administered)  sodium chloride flush (NS) 0.9 % injection 3 mL (not administered)  acetaminophen (TYLENOL) tablet 650 mg (not administered)    Or  acetaminophen (TYLENOL) suppository 650 mg (not administered)  HYDROcodone-acetaminophen (NORCO/VICODIN) 5-325 MG per tablet 1-2 tablet (not administered)  ondansetron (ZOFRAN) tablet 4 mg (not administered)    Or  ondansetron (ZOFRAN) injection 4 mg (not administered)  0.9 %  sodium chloride infusion ( Intravenous New Bag/Given 10/15/16 2314)  levalbuterol (XOPENEX) nebulizer solution 0.63 mg (not administered)  guaiFENesin (MUCINEX) 12 hr tablet 600 mg (600 mg Oral Given 10/15/16 2229)  cefTRIAXone (ROCEPHIN) 1 g in dextrose 5 % 50 mL IVPB (0 g Intravenous Stopped 10/15/16 2018)  acetaminophen (TYLENOL) tablet 650 mg (650 mg Oral Given 10/15/16 2043)  potassium chloride SA (K-DUR,KLOR-CON) CR tablet 40 mEq (40 mEq Oral Given 10/15/16 2229)     ER provider discussed case with:  Cardiology   Hospitalist was called for admission for Acute encephalopathy in the setting of possible UTI and dehydration associated with elevated troponin  Review of Systems:    Pertinent positives include: confusion, fall, back pain  Constitutional:  No weight loss, night sweats, Fevers, chills, fatigue, weight loss  HEENT:  No headaches, Difficulty swallowing,Tooth/dental problems,Sore throat,  No sneezing, itching, ear ache, nasal congestion, post nasal drip,  Cardio-vascular:  No chest pain, Orthopnea, PND, anasarca, dizziness, palpitations.no  Bilateral lower extremity swelling  GI:  No heartburn, indigestion, abdominal pain, nausea, vomiting, diarrhea, change in bowel habits, loss of appetite, melena, blood in stool, hematemesis Resp:  no shortness of breath at rest. No dyspnea on exertion, No excess mucus, no  productive cough, No non-productive cough, No coughing up of blood.No change in color of mucus.No wheezing. Skin:  no rash or lesions. No jaundice GU:  no dysuria, change in color of urine, no urgency or frequency. No straining to urinate.  No flank pain.  Musculoskeletal:  No joint pain or no joint swelling. No decreased range of motion. No back pain.  Psych:  No change in mood or affect. No depression or anxiety. No memory loss.  Neuro: no localizing neurological complaints, no tingling, no weakness, no double vision, no gait abnormality, no slurred speech, no confusion  As per HPI otherwise 10 point review of systems negative.   Past Medical History: Past Medical History:  Diagnosis Date  . GERD (gastroesophageal reflux disease)   . H/O: hematuria 07/2010  . Hyperlipidemia   . Hypertension   . Hypothyroidism   . PONV (postoperative nausea and vomiting)    Past Surgical History:  Procedure Laterality Date  . APPENDECTOMY     age 65  . CHOLECYSTECTOMY     age 4  . DILATION AND CURETTAGE OF UTERUS     multiple's  . ESOPHAGOGASTRODUODENOSCOPY N/A 08/06/2013   Procedure: ESOPHAGOGASTRODUODENOSCOPY (EGD);  Surgeon: Lear Ng, MD;  Location: [facility];  Service: Endoscopy;  Laterality: N/A;  . FRACTURE SURGERY  Dec 2009   left wrist  . OOPHORECTOMY  1954  . TONSILLECTOMY     age 55     Social History:  Ambulatory   Independently     reports that she quit smoking about 3 years ago. Her smoking use included Cigarettes. She has a 31.50 pack-year smoking history. She has never used smokeless tobacco. She reports that she does not drink alcohol or use drugs.  Allergies:   Allergies  Allergen Reactions  . Aspirin Other (See Comments)    GI Bleed  . Ibuprofen Other (See Comments)    GI Bleed  . Ciprofloxacin Hives       Family History:   Family History  Problem Relation Age of Onset  . Thyroid disease Mother   . Dementia Mother   . Thyroid  disease Sister 50    Medications: Prior to Admission medications   Medication Sig Start Date End Date Taking? Authorizing Provider  acetaminophen (TYLENOL) 325 MG tablet Take 650 mg by mouth every 4 (four) hours as needed (for pain).   Yes Historical Provider, MD  cetirizine (ZYRTEC) 10 MG tablet Take 10 mg by mouth daily.   Yes Historical Provider, MD  Cranberry 425 MG CAPS Take 425 mg by mouth 2 (two) times daily.   Yes Historical Provider, MD  escitalopram (LEXAPRO) 10 MG tablet Take 15 mg by mouth daily.   Yes Historical Provider, MD  levothyroxine (SYNTHROID, LEVOTHROID) 75 MCG tablet Take 75 mcg by mouth daily before breakfast.   Yes Historical Provider, MD  pantoprazole (PROTONIX) 40 MG tablet TAKE 1 TABLET TWICE A DAY 06/24/16  Yes Marletta Lor, MD  simvastatin (ZOCOR) 10 MG tablet Take 1 tablet (10 mg total) by mouth at bedtime. 08/05/15  Yes Marletta Lor, MD  Sodium Fluoride (PREVIDENT 5000 PLUS DT) Place 1 application onto teeth every evening.   Yes Historical Provider, MD  triamcinolone (NASACORT AQ) 55 MCG/ACT  AERO nasal inhaler Place 1 spray into the nose daily.   Yes Historical Provider, MD  Vitamin D, Ergocalciferol, (DRISDOL) 50000 units CAPS capsule Take 50,000 Units by mouth every Thursday.   Yes Historical Provider, MD  acetaminophen (TYLENOL) 500 MG tablet Take 1 tablet (500 mg total) by mouth every 6 (six) hours as needed for mild pain. Patient not taking: Reported on 10/15/2016 11/27/15   [name], MD  feeding supplement, ENSURE ENLIVE, (ENSURE ENLIVE) LIQD Take 237 mLs by mouth 2 (two) times daily between meals. Patient not taking: Reported on 10/15/2016 11/27/15   [name], MD  ferrous sulfate 325 (65 FE) MG tablet Take 325 mg by mouth every evening.     Historical Provider, MD  guaiFENesin-codeine (ROBITUSSIN AC) 100-10 MG/5ML syrup Take 10 mLs by mouth 4 (four) times daily as needed for cough.    Historical Provider, MD  levothyroxine  (SYNTHROID, LEVOTHROID) 100 MCG tablet Take 1 tablet (100 mcg total) by mouth daily before breakfast. Patient not taking: Reported on 10/15/2016 11/27/15   [name], MD  meclizine (ANTIVERT) 25 MG tablet Take 25 mg by mouth every 4 (four) hours as needed. Dizziness 10/26/15   Historical Provider, MD  metoprolol tartrate (LOPRESSOR) 25 MG tablet Take 0.5 tablets (12.5 mg total) by mouth 2 (two) times daily. Patient not taking: Reported on 10/15/2016 11/28/15   [name], MD  mometasone (NASONEX) 50 MCG/ACT nasal spray Place 2 sprays into the nose daily.    Historical Provider, MD    Physical Exam: Patient Vitals for the past 24 hrs:  BP Temp Temp src Pulse Resp SpO2 Height Weight  10/15/16 2238 - - - - - 95 % - -  10/15/16 2200 (!) 169/99 97.6 F (36.4 C) Oral 76 20 96 % - -  10/15/16 1945 158/99 - - (!) 43 22 94 % - -  10/15/16 1941 180/97 - - (!) 41 20 93 % - -  10/15/16 1940 - 98.4 F (36.9 C) Oral 82 - - - -  10/15/16 1904 - - - - 19 95 % - -  10/15/16 1903 - - - - - 95 % - -  10/15/16 1902 (!) 173/109 - - - - 94 % - -  10/15/16 1855 - - - (!) 39 20 99 % - -  10/15/16 1834 (!) 173/101 97.7 F (36.5 C) Oral 78 18 92 % - -  10/15/16 1825 - - - - - 93 % - -  10/15/16 1700 - - - - 18 - - -  10/15/16 1640 (!) 144/103 97.7 F (36.5 C) Oral (!) 58 18 - - -  10/15/16 1622 - - - - - - 5\' 6"  (1.676 m) 67.6 kg (149 lb)    1. General:  in No Acute distress 2. Psychological: Alert not Oriented 3. Head/ENT:    Dry Mucous Membranes                          Head Non traumatic, neck supple                            Poor Dentition 4. SKIN:   decreased Skin turgor,  Skin clean Dry and intact no rash 5. Heart: Regular rate and rhythm no  Murmur, Rub or gallop 6. Lungs:  no wheezes or crackles   7. Abdomen: Soft,  non-tender, Non distended 8.  Lower extremities: no clubbing, cyanosis, or edema 9. Neurologically Grossly intact, moving all 4 extremities equally  10. MSK: Normal range  of motion   body mass index is 24.05 kg/m.  Labs on Admission:   Labs on Admission: I have personally reviewed following labs and imaging studies  CBC:  Recent Labs Lab 10/15/16 1705  WBC 11.5*  NEUTROABS 8.7*  HGB 15.1*  HCT 46.8*  MCV 92.5  PLT [id number]   Basic Metabolic Panel:  Recent Labs Lab 10/15/16 1705  NA 139  K 3.1*  CL 99*  CO2 32  GLUCOSE 124*  BUN 24*  CREATININE 0.88  CALCIUM 9.0   GFR: Estimated Creatinine Clearance: 44.5 mL/min (by C-G formula based on SCr of 0.88 mg/dL). Liver Function Tests:  Recent Labs Lab 10/15/16 1705  AST 23  ALT 21  ALKPHOS 64  BILITOT 1.3*  PROT 7.0  ALBUMIN 3.8   No results for input(s): LIPASE, AMYLASE in the last 168 hours. No results for input(s): AMMONIA in the last 168 hours. Coagulation Profile: No results for input(s): INR, PROTIME in the last 168 hours. Cardiac Enzymes:  Recent Labs Lab 10/15/16 1705  TROPONINI 0.08*   BNP (last 3 results) No results for input(s): PROBNP in the last 8760 hours. HbA1C: No results for input(s): HGBA1C in the last 72 hours. CBG:  Recent Labs Lab 10/15/16 1644  GLUCAP 107*   Lipid Profile: No results for input(s): CHOL, HDL, LDLCALC, TRIG, CHOLHDL, LDLDIRECT in the last 72 hours. Thyroid Function Tests: No results for input(s): TSH, T4TOTAL, FREET4, T3FREE, THYROIDAB in the last 72 hours. Anemia Panel: No results for input(s): VITAMINB12, FOLATE, FERRITIN, TIBC, IRON, RETICCTPCT in the last 72 hours. Urine analysis:    Component Value Date/Time   COLORURINE YELLOW 10/15/2016 1825   APPEARANCEUR CLOUDY (A) 10/15/2016 1825   LABSPEC 1.025 10/15/2016 1825   PHURINE 6.5 10/15/2016 1825   GLUCOSEU NEGATIVE 10/15/2016 1825   HGBUR MODERATE (A) 10/15/2016 1825   HGBUR trace-intact 06/28/2010 0916   BILIRUBINUR SMALL (A) 10/15/2016 1825   BILIRUBINUR small 12/18/2013 1220   KETONESUR 15 (A) 10/15/2016 1825   PROTEINUR >300 (A) 10/15/2016 1825   UROBILINOGEN  0.2 12/18/2013 1220   UROBILINOGEN 1.0 06/28/2010 0916   NITRITE NEGATIVE 10/15/2016 1825   LEUKOCYTESUR NEGATIVE 10/15/2016 1825   Sepsis Labs: @LABRCNTIP (procalcitonin:4,lacticidven:4) )No results found for this or any previous visit (from the past 240 hour(s)).     UA   evidence of UTI   Lab Results  Component Value Date   HGBA1C 5.8 (H) 11/24/2015    Estimated Creatinine Clearance: 44.5 mL/min (by C-G formula based on SCr of 0.88 mg/dL).  BNP (last 3 results) No results for input(s): PROBNP in the last 8760 hours.   ECG REPORT  Independently reviewed Rate:94  Rhythm: Sinus tachycardia official read second-degree AV block as per review by cardiology appears to bigeminy ST&T Change: No acute ischemic changes  QTC 464  Filed Weights   10/15/16 1622  Weight: 67.6 kg (149 lb)     Cultures:    Component Value Date/Time   SDES BLOOD LEFT WRIST 11/23/2015 2255   SPECREQUEST  11/23/2015 2255    BOTTLES DRAWN AEROBIC AND ANAEROBIC 10 CC AER, 5 CC ANA   CULT  11/23/2015 2255    NO GROWTH 5 DAYS Performed at [facility] 11/29/2015 FINAL 11/23/2015 2255     Radiological Exams on Admission: Dg Chest 2 View  Result Date: 10/15/2016 CLINICAL  DATA:  Fall 5 days ago. Right chest pain. Initial encounter. EXAM: CHEST  2 VIEW COMPARISON:  11/26/2015 FINDINGS: The heart size and mediastinal contours are within normal limits. Both lungs are clear. Pulmonary hyperinflation again seen, consistent with COPD. IMPRESSION: Stable exam.  COPD.  No active disease. Electronically Signed   By: Earle Gell M.D.   On: 10/15/2016 17:47   Ct Head Wo Contrast  Result Date: 10/15/2016 CLINICAL DATA:  Fall 5 days ago. Worsening confusion. Altered mental status. EXAM: CT HEAD WITHOUT CONTRAST TECHNIQUE: Contiguous axial images were obtained from the base of the skull through the vertex without intravenous contrast. COMPARISON:  11/23/2015 FINDINGS: Brain: No evidence of acute  infarction, hemorrhage, hydrocephalus, extra-axial fluid collections, or mass lesion/mass effect. Mild diffuse cerebral atrophy and severe chronic small vessel disease are again demonstrated. Symmetric increased prominence of extra-axial CSF spaces noted bilaterally, consistent with small subdural hygromas. No evidence of mass effect. Vascular: No hyperdense vessel or unexpected calcification. Skull: Normal. Negative for fracture or focal lesion. Sinuses/Orbits: No acute finding. Other: None. IMPRESSION: No acute intracranial findings. Small bilateral diffuse subdural hygromas, mildly increased in size since previous study. Diffuse cerebral atrophy and chronic small vessel disease. Electronically Signed   By: Earle Gell M.D.   On: 10/15/2016 17:44    Chart has been reviewed    Assessment/Plan  83 y.o. female with medical history significant of Dementia, hypertrophic obstructive cardiomyopathy, hypothyroidism history of hypertension now resolved, GERD admitted for Acute encephalopathy in the setting of possible UTI and dehydration associated with elevated troponin   Present on Admission:  . Acute encephalopathy in a setting of questionable UTI and dehydration with ongoing history of dementia. Will expect some degree of sundowning hope to have some improvement once patient is rehydrated and treated appropriately. If no improvement despite medical management and patient able to tolerate consider further imaging such as MRI. At this point exam is nonfocal but patient a unable to cooperate fully . Dehydration administer IV fluids check orthostatics when able . Elevated troponin patient denies any chest pain ACS unlikely, EKG nonischemic will let cardiology know in a.m. spoke with family at length who feels the patient would not have wanted over aggressive interventions such as pacemaker placement or possibly even cardiac catheterization. Will obtain echogram to further evaluate . Dementia without  behavioral disturbance expect some degree of sundowning . Urinary tract infection await results of urine culture and treat with Rocephin  . GERD will continue home medications . Hypokalemia replace check magnesium level . Hypothyroidism continue home medications   Other plan as per orders.  DVT prophylaxis:  SCD      Code Status:   DNR/DNI   as per patient  Family states she wouln not wish for over aggressive interventions.   Family Communication:   Family  at  Bedside  plan of care was discussed with   Daughter   Disposition Plan:    Back to current facility when stable                                                   Would benefit from PT/OT eval prior to DC   ordered                    Social Work   consulted  Consults called: email cardiology  Admission status:   obs   Level of care     tele         I have spent a total of 56 min on this admission    Teran Daughenbaugh 10/16/2016, 12:13 AM    [facility]  Pager [phone]   after 2 AM please page floor coverage PA If 7AM-7PM, please contact the day team taking care of the patient  [email or web address]  Password [email or web address]

## 2016-10-15 NOTE — ED Notes (Signed)
MD at bedside. 

## 2016-10-15 NOTE — ED Notes (Signed)
Out the door with [facility], daughter present.

## 2016-10-15 NOTE — ED Notes (Signed)
Pt to restroom, unable to void enough in the hat to make a sample.

## 2016-10-15 NOTE — ED Notes (Addendum)
No changes, no dyspnea, alert, interactive, calm, NAD, [facility] here, family at [facility].

## 2016-10-15 NOTE — ED Notes (Addendum)
Pt daughter reports pt fell (unwitnessed) with LOC injuring head and right arm/shoulder at [place] on Tuesday, was checked by EMS on that day and orthostatics taken which were normal. Pt not on blood thinners.   Pt refused transport to hospital on that day.  Last night pt became increasingly confused according to family.  Pt does have dementia.   Pt has bruising to right elbow and right shoulder, abrasion above right eye, right side of chin and right side of lip.  Pt follows commands appropriately. Denies cp.

## 2016-10-15 NOTE — ED Provider Notes (Signed)
[facility] DEPT [facility] Provider Note   CSN: [id number] Arrival date & time: 10/15/16  1559  By signing my name below, I, Dawn Woods, attest that this documentation has been prepared under the direction and in the presence of Malvin Johns, MD. Electronically Signed: [name], ER Scribe. 05/07/2016. 5:48 PM.  History   Chief Complaint Chief Complaint  Patient presents with  . Altered Mental Status   HPI HPI Comments: Dawn Woods is a 83 y.o. female  With a hx of dementia and HTN who presents to the Emergency Department brought in by daughter complaining of altered mental status. She has some mild confusion at baseline, but last night her confusion significantly increased. She got up every half hour through the night to get breakfast. She lives at [facility] skilled [facility]. Her daughter denies fever, diarrhea, vomiting or abdominal pain. She had an unwitnessed fall six days ago and was found completely passed out laying on her right side on the floor. She's been complaining of low back pain since that time. Her daughter states that she had x-rays of her back at the facility earlier today that did not show anything acute. She hasn't been complaining of any other pain.  Past Medical History:  Diagnosis Date  . GERD (gastroesophageal reflux disease)   . H/O: hematuria 07/2010  . Hyperlipidemia   . Hypertension   . Hypothyroidism   . PONV (postoperative nausea and vomiting)     Patient Active Problem List   Diagnosis Date Noted  . Confusion 10/15/2016  . Bronchitis 12/02/2015  . Left leg weakness 12/02/2015  . Dementia without behavioral disturbance 12/02/2015  . Hypertrophic obstructive cardiomyopathy ([place]) 12/02/2015  . Depression 12/02/2015  . Malnutrition of moderate degree 11/26/2015  . Acute respiratory failure with hypoxia ([place]) 11/26/2015  . Acute encephalopathy 11/24/2015  . Hypokalemia 11/24/2015  . Dehydration 11/24/2015  . Generalized  weakness 11/24/2015  . UTI (lower urinary tract infection)   . BPV (benign positional vertigo) 08/25/2014  . Melena 08/05/2013  . Personal history of colonic polyps 08/05/2013  . Tobacco abuse 01/04/2012  . UTI 05/19/2010  . Hypothyroidism 04/21/2008  . Dyslipidemia 04/21/2008  . Essential hypertension 04/21/2008  . GERD 04/21/2008    Past Surgical History:  Procedure Laterality Date  . APPENDECTOMY     age 22  . CHOLECYSTECTOMY     age 23  . DILATION AND CURETTAGE OF UTERUS     multiple's  . ESOPHAGOGASTRODUODENOSCOPY N/A 08/06/2013   Procedure: ESOPHAGOGASTRODUODENOSCOPY (EGD);  Surgeon: Lear Ng, MD;  Location: [facility];  Service: Endoscopy;  Laterality: N/A;  . FRACTURE SURGERY  Dec 2009   left wrist  . OOPHORECTOMY  1954  . TONSILLECTOMY     age 43    OB History    No data available       Home Medications    Prior to Admission medications   Medication Sig Start Date End Date Taking? Authorizing Provider  acetaminophen (TYLENOL) 500 MG tablet Take 1 tablet (500 mg total) by mouth every 6 (six) hours as needed for mild pain. 11/27/15   [name], MD  cetirizine (ZYRTEC) 10 MG tablet Take 10 mg by mouth daily.    Historical Provider, MD  Cranberry 425 MG CAPS Take 425 mg by mouth 2 (two) times daily.    Historical Provider, MD  feeding supplement, ENSURE ENLIVE, (ENSURE ENLIVE) LIQD Take 237 mLs by mouth 2 (two) times daily between meals. Patient taking differently: Take  240 mLs by mouth 2 (two) times daily between meals.  11/27/15   [name], MD  ferrous sulfate 325 (65 FE) MG tablet Take 325 mg by mouth every evening.     Historical Provider, MD  guaiFENesin-codeine (ROBITUSSIN AC) 100-10 MG/5ML syrup Take 10 mLs by mouth 4 (four) times daily as needed for cough.    Historical Provider, MD  levothyroxine (SYNTHROID, LEVOTHROID) 100 MCG tablet Take 1 tablet (100 mcg total) by mouth daily before breakfast. 11/27/15   [name], MD    meclizine (ANTIVERT) 25 MG tablet Take 25 mg by mouth every 4 (four) hours as needed. Dizziness 10/26/15   Historical Provider, MD  metoprolol tartrate (LOPRESSOR) 25 MG tablet Take 0.5 tablets (12.5 mg total) by mouth 2 (two) times daily. 11/28/15   [name], MD  mometasone (NASONEX) 50 MCG/ACT nasal spray Place 2 sprays into the nose daily.    Historical Provider, MD  pantoprazole (PROTONIX) 40 MG tablet TAKE 1 TABLET TWICE A DAY 06/24/16   Marletta Lor, MD  simvastatin (ZOCOR) 10 MG tablet Take 1 tablet (10 mg total) by mouth at bedtime. 08/05/15   Marletta Lor, MD    Family History Family History  Problem Relation Age of Onset  . Thyroid disease Sister 73    Social History Social History  Substance Use Topics  . Smoking status: Former Smoker    Packs/day: 0.50    Years: 63.00    Types: Cigarettes    Quit date: 08/05/2013  . Smokeless tobacco: Never Used  . Alcohol use No     Allergies   Aspirin; Ibuprofen; and Ciprofloxacin   Review of Systems Review of Systems  Unable to perform ROS: Dementia     Physical Exam Updated Vital Signs BP (!) 173/109   Pulse (!) 39   Temp 97.7 F (36.5 C) (Oral)   Resp 19   Ht 5\' 6"  (1.676 m)   Wt 149 lb (67.6 kg)   SpO2 95%   BMI 24.05 kg/m   Physical Exam  Constitutional: She appears well-developed and well-nourished.  HENT:  Head: Normocephalic and atraumatic.  Eyes: Pupils are equal, round, and reactive to light.  Neck: Normal range of motion. Neck supple.  Cardiovascular: Normal rate, regular rhythm and normal heart sounds.   Pulmonary/Chest: Effort normal and breath sounds normal. No respiratory distress. She has no wheezes. She has no rales. She exhibits no tenderness.  TTP to right anterior ribs.   Abdominal: Soft. Bowel sounds are normal. There is no tenderness. There is no rebound and no guarding.  Musculoskeletal: Normal range of motion. She exhibits no edema.  No pain to cervical or thoracic  spine. Some TTP to upper lumbar spine. No step offs or deformities. No pain on ROM or palpation of extremities.   Lymphadenopathy:    She has no cervical adenopathy.  Neurological: She is alert.  Oriented to person and place only. She will talk STEMI symmetrically without focal deficits  Skin: Skin is warm and dry. No rash noted.   Abrasions to right forehead and jaw. No bony TTP to face.   Psychiatric: She has a normal mood and affect.     ED Treatments / Results  COORDINATION OF CARE: 5:19 PM Discussed treatment plan with pt at bedside and pt agreed to plan.  Labs (all labs ordered are listed, but only abnormal results are displayed) Labs Reviewed  CBC WITH DIFFERENTIAL/PLATELET - Abnormal; Notable for the following:  Result Value   WBC 11.5 (*)    Hemoglobin 15.1 (*)    HCT 46.8 (*)    Neutro Abs 8.7 (*)    Monocytes Absolute 1.3 (*)    All other components within normal limits  COMPREHENSIVE METABOLIC PANEL - Abnormal; Notable for the following:    Potassium 3.1 (*)    Chloride 99 (*)    Glucose, Bld 124 (*)    BUN 24 (*)    Total Bilirubin 1.3 (*)    GFR calc non Af Amer 59 (*)    All other components within normal limits  URINALYSIS, ROUTINE W REFLEX MICROSCOPIC - Abnormal; Notable for the following:    APPearance CLOUDY (*)    Hgb urine dipstick MODERATE (*)    Bilirubin Urine SMALL (*)    Ketones, ur 15 (*)    Protein, ur >300 (*)    All other components within normal limits  TROPONIN I - Abnormal; Notable for the following:    Troponin I 0.08 (*)    All other components within normal limits  URINALYSIS, MICROSCOPIC (REFLEX) - Abnormal; Notable for the following:    Bacteria, UA MANY (*)    Squamous Epithelial / LPF 0-5 (*)    All other components within normal limits  CBG MONITORING, ED - Abnormal; Notable for the following:    Glucose-Capillary 107 (*)    All other components within normal limits  URINE CULTURE  I-STAT CG4 LACTIC ACID, ED    EKG   EKG Interpretation  Date/Time:  Saturday October 15 2016 16:32:48 EST Ventricular Rate:  98 PR Interval:  158 QRS Duration: 86 QT Interval:  364 QTC Calculation: 464 R Axis:   43 Text Interpretation:   Critical Test Result: AV Block Sinus tachycardia with 2nd degree A-V block (Mobitz I) with occasional Premature ventricular complexes Cannot rule out Inferior infarct , age undetermined Anterior infarct , age undetermined Abnormal ECG changed from prior EKG Confirmed by Letrice Pollok  MD, Yekaterina Escutia [phone]) on 10/15/2016 4:38:42 PM       Radiology Dg Chest 2 View  Result Date: 10/15/2016 CLINICAL DATA:  Fall 5 days ago. Right chest pain. Initial encounter. EXAM: CHEST  2 VIEW COMPARISON:  11/26/2015 FINDINGS: The heart size and mediastinal contours are within normal limits. Both lungs are clear. Pulmonary hyperinflation again seen, consistent with COPD. IMPRESSION: Stable exam.  COPD.  No active disease. Electronically Signed   By: Earle Gell M.D.   On: 10/15/2016 17:47   Ct Head Wo Contrast  Result Date: 10/15/2016 CLINICAL DATA:  Fall 5 days ago. Worsening confusion. Altered mental status. EXAM: CT HEAD WITHOUT CONTRAST TECHNIQUE: Contiguous axial images were obtained from the base of the skull through the vertex without intravenous contrast. COMPARISON:  11/23/2015 FINDINGS: Brain: No evidence of acute infarction, hemorrhage, hydrocephalus, extra-axial fluid collections, or mass lesion/mass effect. Mild diffuse cerebral atrophy and severe chronic small vessel disease are again demonstrated. Symmetric increased prominence of extra-axial CSF spaces noted bilaterally, consistent with small subdural hygromas. No evidence of mass effect. Vascular: No hyperdense vessel or unexpected calcification. Skull: Normal. Negative for fracture or focal lesion. Sinuses/Orbits: No acute finding. Other: None. IMPRESSION: No acute intracranial findings. Small bilateral diffuse subdural hygromas, mildly increased in size  since previous study. Diffuse cerebral atrophy and chronic small vessel disease. Electronically Signed   By: Earle Gell M.D.   On: 10/15/2016 17:44    Procedures Procedures (including critical care time)  Medications Ordered in ED Medications  cefTRIAXone (  ROCEPHIN) 1 g in dextrose 5 % 50 mL IVPB (not administered)     Initial Impression / Assessment and Plan / ED Course  I have reviewed the triage vital signs and the nursing notes.  Pertinent labs & imaging results that were available during my care of the patient were reviewed by me and considered in my medical decision making (see chart for details).  Clinical Course as of Oct 15 1914  Sat Oct 15, 2016  1825 Discussed pt with cardiology, Dr. Kenton Kingfisher, pt with mild elevation in trop, ?Mobitz Type 1 heart block. Doesn't feel that pt needs further cards eval unless trop continues to rise.  [MB]    Clinical Course User Index [MB] Malvin Johns, MD    Pt Presents with confusion. She did have an unwitnessed fall/syncopal event earlier this week. There is no arrhythmias noted on EKG. No ischemic changes. She does appear to be in atrial bigeminy. Her troponin is mildly elevated. She is denying any chest pain other than she does have pain in her right ribs which is reproducible on palpation. This seems to be resulting from the recent fall that she had. She did fall on the right side at that point. Her urine and her symptoms are concerning for UTI. It was sent for culture and I started her on Rocephin. She has no suggestions of sepsis. I spoke with Dr. Myna Hidalgo who is accepted patient for admission and transfer to [facility].  Final Clinical Impressions(s) / ED Diagnoses   Final diagnoses:  Confusion  Lower urinary tract infectious disease  Hypokalemia  Elevated troponin    New Prescriptions New Prescriptions   No medications on file     I personally performed the services described in this documentation, which was scribed in my  presence.  The recorded information has been reviewed and considered.         Malvin Johns, MD 10/15/16 [phone]

## 2016-10-15 NOTE — Progress Notes (Signed)
Accepted pt to telemetry bed under observation status for AMS with arrhythmia (looked to ED physician like Wenkebach, but cardiologist said was bigeminy), mild trop elevation, and questionable UTI. She is being treated with abx for possible UTI.

## 2016-10-15 NOTE — ED Notes (Signed)
Pt pleasantly irritable, NAD, calm, interactive, resps e/u, no dyspnea noted, confused, skin W&D, IVF/abx infusing, daughter at [facility]. Pt unable to remember directions to straighten and relax arms, fidgeting with oxisensor, behavior effecting VSs. Will continue to monitor.

## 2016-10-15 NOTE — ED Notes (Signed)
Pt placed on 2L 02 [place] for 02 sats 89%.  

## 2016-10-16 ENCOUNTER — Observation Stay (HOSPITAL_BASED_OUTPATIENT_CLINIC_OR_DEPARTMENT_OTHER): Payer: Medicare Other

## 2016-10-16 DIAGNOSIS — I248 Other forms of acute ischemic heart disease: Secondary | ICD-10-CM | POA: Diagnosis not present

## 2016-10-16 DIAGNOSIS — Z886 Allergy status to analgesic agent status: Secondary | ICD-10-CM | POA: Diagnosis not present

## 2016-10-16 DIAGNOSIS — I421 Obstructive hypertrophic cardiomyopathy: Secondary | ICD-10-CM | POA: Diagnosis present

## 2016-10-16 DIAGNOSIS — G3109 Other frontotemporal dementia: Secondary | ICD-10-CM | POA: Diagnosis not present

## 2016-10-16 DIAGNOSIS — D181 Lymphangioma, any site: Secondary | ICD-10-CM | POA: Diagnosis present

## 2016-10-16 DIAGNOSIS — F0390 Unspecified dementia without behavioral disturbance: Secondary | ICD-10-CM | POA: Diagnosis present

## 2016-10-16 DIAGNOSIS — Z90722 Acquired absence of ovaries, bilateral: Secondary | ICD-10-CM | POA: Diagnosis not present

## 2016-10-16 DIAGNOSIS — E860 Dehydration: Secondary | ICD-10-CM | POA: Diagnosis not present

## 2016-10-16 DIAGNOSIS — B961 Klebsiella pneumoniae [K. pneumoniae] as the cause of diseases classified elsewhere: Secondary | ICD-10-CM | POA: Diagnosis present

## 2016-10-16 DIAGNOSIS — N390 Urinary tract infection, site not specified: Secondary | ICD-10-CM | POA: Diagnosis present

## 2016-10-16 DIAGNOSIS — R05 Cough: Secondary | ICD-10-CM | POA: Diagnosis not present

## 2016-10-16 DIAGNOSIS — S72009A Fracture of unspecified part of neck of unspecified femur, initial encounter for closed fracture: Secondary | ICD-10-CM | POA: Diagnosis not present

## 2016-10-16 DIAGNOSIS — E876 Hypokalemia: Secondary | ICD-10-CM | POA: Diagnosis present

## 2016-10-16 DIAGNOSIS — R0602 Shortness of breath: Secondary | ICD-10-CM | POA: Diagnosis not present

## 2016-10-16 DIAGNOSIS — I6529 Occlusion and stenosis of unspecified carotid artery: Secondary | ICD-10-CM | POA: Diagnosis not present

## 2016-10-16 DIAGNOSIS — Z87440 Personal history of urinary (tract) infections: Secondary | ICD-10-CM | POA: Diagnosis not present

## 2016-10-16 DIAGNOSIS — R410 Disorientation, unspecified: Secondary | ICD-10-CM | POA: Diagnosis not present

## 2016-10-16 DIAGNOSIS — R7989 Other specified abnormal findings of blood chemistry: Secondary | ICD-10-CM | POA: Diagnosis not present

## 2016-10-16 DIAGNOSIS — G92 Toxic encephalopathy: Secondary | ICD-10-CM | POA: Diagnosis present

## 2016-10-16 DIAGNOSIS — G9340 Encephalopathy, unspecified: Secondary | ICD-10-CM | POA: Diagnosis not present

## 2016-10-16 DIAGNOSIS — R001 Bradycardia, unspecified: Secondary | ICD-10-CM | POA: Diagnosis present

## 2016-10-16 DIAGNOSIS — Z8349 Family history of other endocrine, nutritional and metabolic diseases: Secondary | ICD-10-CM | POA: Diagnosis not present

## 2016-10-16 DIAGNOSIS — E785 Hyperlipidemia, unspecified: Secondary | ICD-10-CM | POA: Diagnosis present

## 2016-10-16 DIAGNOSIS — Z79899 Other long term (current) drug therapy: Secondary | ICD-10-CM | POA: Diagnosis not present

## 2016-10-16 DIAGNOSIS — I951 Orthostatic hypotension: Secondary | ICD-10-CM | POA: Diagnosis present

## 2016-10-16 DIAGNOSIS — I10 Essential (primary) hypertension: Secondary | ICD-10-CM | POA: Diagnosis present

## 2016-10-16 DIAGNOSIS — E039 Hypothyroidism, unspecified: Secondary | ICD-10-CM | POA: Diagnosis present

## 2016-10-16 DIAGNOSIS — R748 Abnormal levels of other serum enzymes: Secondary | ICD-10-CM | POA: Diagnosis not present

## 2016-10-16 DIAGNOSIS — Z87891 Personal history of nicotine dependence: Secondary | ICD-10-CM | POA: Diagnosis not present

## 2016-10-16 DIAGNOSIS — F0150 Vascular dementia without behavioral disturbance: Secondary | ICD-10-CM | POA: Diagnosis not present

## 2016-10-16 DIAGNOSIS — R531 Weakness: Secondary | ICD-10-CM | POA: Diagnosis not present

## 2016-10-16 DIAGNOSIS — K219 Gastro-esophageal reflux disease without esophagitis: Secondary | ICD-10-CM | POA: Diagnosis not present

## 2016-10-16 DIAGNOSIS — Z881 Allergy status to other antibiotic agents status: Secondary | ICD-10-CM | POA: Diagnosis not present

## 2016-10-16 DIAGNOSIS — W19XXXA Unspecified fall, initial encounter: Secondary | ICD-10-CM | POA: Diagnosis present

## 2016-10-16 DIAGNOSIS — Z66 Do not resuscitate: Secondary | ICD-10-CM | POA: Diagnosis present

## 2016-10-16 LAB — TROPONIN I
TROPONIN I: 0.07 ng/mL — AB (ref <0.03)
TROPONIN I: 0.06 ng/mL — AB (ref <0.03)
TROPONIN I: 0.06 ng/mL — AB (ref <0.03)
TROPONIN I: 0.05 ng/mL — AB (ref <0.03)

## 2016-10-16 LAB — COMPREHENSIVE METABOLIC PANEL
ALBUMIN: 3.6 g/dL (ref 3.5–5.0)
ALK PHOS: 59 U/L (ref 38–126)
ALT: 22 U/L (ref 14–54)
AST: 25 U/L (ref 15–41)
Anion gap: 8 (ref 5–15)
BUN: 15 mg/dL (ref 6–20)
CALCIUM: 8.9 mg/dL (ref 8.9–10.3)
CO2: 32 mmol/L (ref 22–32)
CREATININE: 0.79 mg/dL (ref 0.44–1.00)
Chloride: 102 mmol/L (ref 101–111)
GFR calc Af Amer: >60 mL/min (ref >60)
GFR calc non Af Amer: >60 mL/min (ref >60)
GLUCOSE: 120 mg/dL — AB (ref 65–99)
Potassium: 3.5 mmol/L (ref 3.5–5.1)
Sodium: 142 mmol/L (ref 135–145)
TOTAL PROTEIN: 6.5 g/dL (ref 6.5–8.1)
Total Bilirubin: 1.5 mg/dL — ABNORMAL HIGH (ref 0.3–1.2)

## 2016-10-16 LAB — ECHOCARDIOGRAM COMPLETE
FS: 21 % — AB (ref 28–44)
Height: 66 in
IVS/LV PW RATIO, ED: 1.44
LA ID, A-P, ES: 35 mm
LA diam end sys: 35 mm
LA diam index: 1.99 cm/m2
LA vol A4C: 29.7 ml
LA vol index: 18.1 mL/m2
LAVOL: 31.8 mL
LV PW d: 10.2 mm — AB (ref 0.6–1.1)
LVOT area: 2.27 cm2
LVOT diameter: 17 mm
Weight: 2384 oz

## 2016-10-16 LAB — MRSA PCR SCREENING: MRSA by PCR: NEGATIVE

## 2016-10-16 LAB — CBC
HCT: 48.2 % — ABNORMAL HIGH (ref 36.0–46.0)
Hemoglobin: 15.6 g/dL — ABNORMAL HIGH (ref 12.0–15.0)
MCH: 29.8 pg (ref 26.0–34.0)
MCHC: 32.4 g/dL (ref 30.0–36.0)
MCV: 92.2 fL (ref 78.0–100.0)
Platelets: 241 10*3/uL (ref 150–400)
RBC: 5.23 MIL/uL — ABNORMAL HIGH (ref 3.87–5.11)
RDW: 14.1 % (ref 11.5–15.5)
WBC: 14.5 10*3/uL — ABNORMAL HIGH (ref 4.0–10.5)

## 2016-10-16 LAB — MAGNESIUM: MAGNESIUM: 1.7 mg/dL (ref 1.7–2.4)

## 2016-10-16 LAB — TSH: TSH: 9.715 u[IU]/mL — ABNORMAL HIGH (ref 0.350–4.500)

## 2016-10-16 LAB — PHOSPHORUS: Phosphorus: 2.3 mg/dL — ABNORMAL LOW (ref 2.5–4.6)

## 2016-10-16 LAB — T4, FREE: FREE T4: 1.08 ng/dL (ref 0.61–1.12)

## 2016-10-16 MED ORDER — HYDRALAZINE HCL 20 MG/ML IJ SOLN
10.0000 mg | INTRAMUSCULAR | Status: DC | PRN
  Administered 2016-10-17 – 2016-10-21 (×5): 10 mg via INTRAVENOUS
  Filled 2016-10-16 (×6): qty 1

## 2016-10-16 MED ORDER — LISINOPRIL 5 MG PO TABS
5.0000 mg | ORAL_TABLET | Freq: Every day | ORAL | Status: DC
  Administered 2016-10-16: 5 mg via ORAL
  Filled 2016-10-16: qty 1

## 2016-10-16 MED ORDER — SIMVASTATIN 10 MG PO TABS
10.0000 mg | ORAL_TABLET | Freq: Every day | ORAL | Status: DC
  Administered 2016-10-16 – 2016-10-20 (×5): 10 mg via ORAL
  Filled 2016-10-16 (×5): qty 1

## 2016-10-16 MED ORDER — ESCITALOPRAM OXALATE 10 MG PO TABS
15.0000 mg | ORAL_TABLET | Freq: Every day | ORAL | Status: DC
  Administered 2016-10-16 – 2016-10-21 (×6): 15 mg via ORAL
  Filled 2016-10-16 (×6): qty 2

## 2016-10-16 MED ORDER — SODIUM CHLORIDE 0.9 % IV SOLN
INTRAVENOUS | Status: DC
Start: 2016-10-16 — End: 2016-10-20
  Administered 2016-10-16: 14:00:00 via INTRAVENOUS
  Administered 2016-10-17 – 2016-10-18 (×2): 1000 mL via INTRAVENOUS
  Administered 2016-10-18 – 2016-10-20 (×4): via INTRAVENOUS

## 2016-10-16 MED ORDER — LEVOTHYROXINE SODIUM 75 MCG PO TABS
75.0000 ug | ORAL_TABLET | Freq: Every day | ORAL | Status: DC
  Administered 2016-10-16 – 2016-10-21 (×6): 75 ug via ORAL
  Filled 2016-10-16 (×6): qty 1

## 2016-10-16 NOTE — Progress Notes (Signed)
PROGRESS NOTE    Dawn Woods  [id number] DOB: [birth year] DOA: 10/15/2016 PCP: [name], MD    Brief Narrative:  83 y.o. female with medical history significant of Dementia, hypertrophic obstructive cardiomyopathy, hypothyroidism history of hypertension now resolved, GERD    Presented with worsening confusion since last night, she lives in assisted living and have been unable to recognize familiar faces asking for breakfast in the middle of the night. She fell on Tuesday 16th was found down. Was worrisome if she hit her head or at least face since she had abrasions to her face. No history of being on blood thinners She refused to to to ER. She's been reporting some back pain Her vitals were monitored and have been stable.  She have had xrays of her back no fractures were noted.  Patient have denied any chest pain when questioning she states "I am fine" Family denies any fever, she is prone to UTI's in the past in February patient have had similar admission and was found to have UTI.  Nursing staff was concerned patient was taken to [facility]  Assessment & Plan:   Active Problems:   Hypothyroidism   GERD   Urinary tract infection   Acute encephalopathy   Hypokalemia   Dehydration   Lower urinary tract infectious disease   Dementia without behavioral disturbance   Confusion   Elevated troponin   1. Toxic metabolic encephalopathy 1. Possible related to dehydration vs HTN vs worsening dementia 2. Family reports improved today, although not yet at baseline 3. Will continue IVF hydration 4. Treat BP per below 5. Doubt UTI given no leuks, no nitrite, no WBC. Many bacteria noted, more suggestive of bacturia  2. Dementia 1. Appears stable at present 2. Continue to monitor 3. Orthostasis 1. Orthostatic vitals reviewed 2. 0000000 systolic drop from laying to sitting and 76mm drop from sitting to standing noted 3. Continue IVF as tolerated 4. LIkely  dehydration 1. Per above, orthostatics positive 2. UA pos for ketones. 3. CBC profile with higher WBC, HGB, and Plts over baseline suggesting concentrated CBC 4. Hygromas noted on head CT, which may be attributed to dehydration 5. Continue IVF as tolerated 5. Elevated troponin 1. Trending down 2. Doubt ACS. Chest pain is very reproducible on exam 3. Follow serial trop 6. Hypothyroid 1. Continue thyroid replacement as tolerated 2. TSH currently 9.75, down from 16.88 in 2/27 3. Ordered free T4, which is normal 7. HTN 1. BP poorly controlled with SBP into the 200's 2. Renal function stable 3. Avoid beta blockers given asymptomatic bradycardia 4. Have started lisinopril 5mg . Titrate accordingly  DVT prophylaxis: SCD's Code Status: Full Family Communication: Pt in room, family at bedside Disposition Plan: Uncertain at this time  Consultants:     Procedures:     Antimicrobials: Anti-infectives    Start     Dose/Rate Route Frequency Ordered Stop   10/16/16 1900  cefTRIAXone (ROCEPHIN) 1 g in dextrose 5 % 50 mL IVPB     1 g 100 mL/hr over 30 Minutes Intravenous Every 24 hours 10/15/16 2208     10/15/16 1915  cefTRIAXone (ROCEPHIN) 1 g in dextrose 5 % 50 mL IVPB     1 g 100 mL/hr over 30 Minutes Intravenous  Once 10/15/16 1909 10/15/16 2018       Subjective: Without complaints  Objective: Vitals:   10/16/16 0047 10/16/16 0550 10/16/16 1231 10/16/16 1545  BP: (!) 178/96 (!) 182/88  (!) 168/103  Pulse: Marland Kitchen)  51 (!) 36 (!) 109 90  Resp: 18 19  (!) 21  Temp:    97.8 F (36.6 C)  TempSrc:    Oral  SpO2: 98% 93% 95% 98%  Weight:      Height:        Intake/Output Summary (Last 24 hours) at 10/16/16 1727 Last data filed at 10/16/16 0645  Gross per 24 hour  Intake             1020 ml  Output               15 ml  Net             1005 ml   Filed Weights   10/15/16 1622  Weight: 67.6 kg (149 lb)    Examination:  General exam: Appears calm and comfortable    Respiratory system: Clear to auscultation. Respiratory effort normal. Cardiovascular system: S1 & S2 heard, RRR Gastrointestinal system: Abdomen is nondistended, soft and nontender. No organomegaly or masses felt. Normal bowel sounds heard. Central nervous system: Alert and oriented. No focal neurological deficits. Extremities: Symmetric 5 x 5 power. Skin: No rashes, lesions Psychiatry: Judgement and insight appear normal. Mood & affect appropriate.   Data Reviewed: I have personally reviewed following labs and imaging studies  CBC:  Recent Labs Lab 10/15/16 1705 10/16/16 0320  WBC 11.5* 14.5*  NEUTROABS 8.7*  --   HGB 15.1* 15.6*  HCT 46.8* 48.2*  MCV 92.5 92.2  PLT 239 [id number]   Basic Metabolic Panel:  Recent Labs Lab 10/15/16 1705 10/16/16 0320  NA 139 142  K 3.1* 3.5  CL 99* 102  CO2 32 32  GLUCOSE 124* 120*  BUN 24* 15  CREATININE 0.88 0.79  CALCIUM 9.0 8.9  MG  --  1.7  PHOS  --  2.3*   GFR: Estimated Creatinine Clearance: 49 mL/min (by C-G formula based on SCr of 0.79 mg/dL). Liver Function Tests:  Recent Labs Lab 10/15/16 1705 10/16/16 0320  AST 23 25  ALT 21 22  ALKPHOS 64 59  BILITOT 1.3* 1.5*  PROT 7.0 6.5  ALBUMIN 3.8 3.6   No results for input(s): LIPASE, AMYLASE in the last 168 hours. No results for input(s): AMMONIA in the last 168 hours. Coagulation Profile: No results for input(s): INR, PROTIME in the last 168 hours. Cardiac Enzymes:  Recent Labs Lab 10/15/16 1705 10/15/16 2227 10/16/16 0320 10/16/16 1226 10/16/16 1407  TROPONINI 0.08* 0.07* 0.07* 0.06* 0.06*   BNP (last 3 results) No results for input(s): PROBNP in the last 8760 hours. HbA1C: No results for input(s): HGBA1C in the last 72 hours. CBG:  Recent Labs Lab 10/15/16 1644  GLUCAP 107*   Lipid Profile: No results for input(s): CHOL, HDL, LDLCALC, TRIG, CHOLHDL, LDLDIRECT in the last 72 hours. Thyroid Function Tests:  Recent Labs  10/16/16 0320  10/16/16 1226  TSH 9.715*  --   FREET4  --  1.08   Anemia Panel: No results for input(s): VITAMINB12, FOLATE, FERRITIN, TIBC, IRON, RETICCTPCT in the last 72 hours. Sepsis Labs:  Recent Labs Lab 10/15/16 1727  LATICACIDVEN 1.73    Recent Results (from the past 240 hour(s))  MRSA PCR Screening     Status: None   Collection Time: 10/15/16  9:50 PM  Result Value Ref Range Status   MRSA by PCR NEGATIVE NEGATIVE Final    Comment:        The GeneXpert MRSA Assay (FDA approved for NASAL specimens only), is one  component of a comprehensive MRSA colonization surveillance program. It is not intended to diagnose MRSA infection nor to guide or monitor treatment for MRSA infections.      Radiology Studies: Dg Chest 2 View  Result Date: 10/15/2016 CLINICAL DATA:  Fall 5 days ago. Right chest pain. Initial encounter. EXAM: CHEST  2 VIEW COMPARISON:  11/26/2015 FINDINGS: The heart size and mediastinal contours are within normal limits. Both lungs are clear. Pulmonary hyperinflation again seen, consistent with COPD. IMPRESSION: Stable exam.  COPD.  No active disease. Electronically Signed   By: Earle Gell M.D.   On: 10/15/2016 17:47   Ct Head Wo Contrast  Result Date: 10/15/2016 CLINICAL DATA:  Fall 5 days ago. Worsening confusion. Altered mental status. EXAM: CT HEAD WITHOUT CONTRAST TECHNIQUE: Contiguous axial images were obtained from the base of the skull through the vertex without intravenous contrast. COMPARISON:  11/23/2015 FINDINGS: Brain: No evidence of acute infarction, hemorrhage, hydrocephalus, extra-axial fluid collections, or mass lesion/mass effect. Mild diffuse cerebral atrophy and severe chronic small vessel disease are again demonstrated. Symmetric increased prominence of extra-axial CSF spaces noted bilaterally, consistent with small subdural hygromas. No evidence of mass effect. Vascular: No hyperdense vessel or unexpected calcification. Skull: Normal. Negative for  fracture or focal lesion. Sinuses/Orbits: No acute finding. Other: None. IMPRESSION: No acute intracranial findings. Small bilateral diffuse subdural hygromas, mildly increased in size since previous study. Diffuse cerebral atrophy and chronic small vessel disease. Electronically Signed   By: Earle Gell M.D.   On: 10/15/2016 17:44    Scheduled Meds: . cefTRIAXone (ROCEPHIN) IVPB 1 gram/50 mL D5W  1 g Intravenous Q24H  . escitalopram  15 mg Oral Daily  . guaiFENesin  600 mg Oral BID  . levothyroxine  75 mcg Oral QAC breakfast  . lisinopril  5 mg Oral Daily  . simvastatin  10 mg Oral QHS  . sodium chloride flush  3 mL Intravenous Q12H   Continuous Infusions: . sodium chloride 75 mL/hr at 10/16/16 1425     LOS: 0 days   Jeramie Scogin, Orpah Melter, MD [facility] Pager [phone]  If 7PM-7AM, please contact night-coverage [email or web address] Password [facility] 10/16/2016, 5:27 PM

## 2016-10-16 NOTE — Progress Notes (Signed)
  Echocardiogram 2D Echocardiogram has been performed.  Dawn Woods 10/16/2016, 11:18 AM

## 2016-10-16 NOTE — Progress Notes (Signed)
Physical Therapy Evaluation Patient Details Name: Dawn Woods MRN: [id number] DOB: 1929/12/30 Today's Date: 10/16/2016   History of Present Illness  83 y.o. female admitted to [facility] on 10/15/16 for increased confusion, fall.  Dx with questionable UTI and dehydration with mildly elevated troponins.  Medical and cardiac workup in progress.  Pt with signifciant PMHx of dementia, HTN, and L wrist fx surgery.   Clinical Impression  Per daughter report she is still more confused than baseline.  She is reporting multiple areas of pain including her head, back, and chest.  O2 sats are WNL on RA, HR 109 with monitor indicating irregular heartbeat.  RN made aware.  Pt seems to be a bit more unsteady than her reported baseline, reaching for objects and furniture for stability during gait.  She would likely do best to go back to her normal environment at [place] care unit.  She could benefit from some HHPT if able to ensure that her balance and mobility return to baseline.  PT to follow acutely until d/c confirmed.       Follow Up Recommendations Home health PT;Other (comment) (at [facility])    Equipment Recommendations  None recommended by PT    Recommendations for Other Services   NA    Precautions / Restrictions Precautions Precautions: Fall Precaution Comments: recent h/o falls      Mobility  Bed Mobility Overal bed mobility: Needs Assistance Bed Mobility: Supine to Sit;Sit to Supine     Supine to sit: Modified independent (Device/Increase time);HOB elevated Sit to supine: Mod assist   General bed mobility comments: Pt able to get to sitting EOB without external assist with use of rail and HOB elevated  Transfers Overall transfer level: Needs assistance Equipment used: 1 person hand held assist Transfers: Sit to/from Stand Sit to Stand: Min guard         General transfer comment: Min guard assist as pt is reaching for objects to stabilize with around the room.    Ambulation/Gait Ambulation/Gait assistance: Min guard Ambulation Distance (Feet): 15 Feet Assistive device: 1 person hand held assist Gait Pattern/deviations: Step-through pattern;Staggering left;Staggering right     General Gait Details: Pt with mildly staggering gait pattern, again reaching for objects in room to stabilize as she made her way around the end of the bed to the chair.  Pt did not want to walk in the hallway at this time.          Balance Overall balance assessment: Needs assistance Sitting-balance support: Feet supported;Bilateral upper extremity supported Sitting balance-Leahy Scale: Fair     Standing balance support: Single extremity supported Standing balance-Leahy Scale: Poor Standing balance comment: seems to need some support to steady herself.                              Pertinent Vitals/Pain Pain Assessment: Faces Faces Pain Scale: Hurts even more Pain Location: head, back, chest Pain Descriptors / Indicators: Grimacing (restless in the chair) Pain Intervention(s): Limited activity within patient's tolerance;Monitored during session;Repositioned;RN gave pain meds during session;Heat applied    Home Living Family/patient expects to be discharged to:: Assisted living (at [facility] in memory care unit)                 Additional Comments: Per daughter's report she is independent at baseline, does her own bathing and dressing, walks without assitance or assistive device, and 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     Prior Function Level of Independence: Independent               Hand Dominance   Dominant Hand: Right    Extremity/Trunk Assessment   Upper Extremity Assessment Upper Extremity Assessment: Generalized weakness    Lower Extremity Assessment Lower Extremity Assessment: Generalized weakness    Cervical / Trunk Assessment Cervical / Trunk Assessment: Other  exceptions Cervical / Trunk Exceptions: pt with acute back pain, no signs of bruising, x-ray negative for fx  Communication   Communication: No difficulties  Cognition Arousal/Alertness: Awake/alert Behavior During Therapy: Restless Overall Cognitive Status: Impaired/Different from baseline                 General Comments: Per daughter she normally can tell people her (daughter's) name.             Assessment/Plan    PT Assessment Patient needs continued PT services  PT Problem List Decreased strength;Decreased activity tolerance;Decreased balance;Decreased mobility;Decreased cognition;Decreased safety awareness;Pain          PT Treatment Interventions DME instruction;Gait training;Functional mobility training;Therapeutic activities;Therapeutic exercise;Balance training;Patient/family education;Modalities    PT Goals (Current goals can be found in the Care Plan section)  Acute Rehab PT Goals Patient Stated Goal: daughter would like to figure out what is making her fall and if it is UTI or if she passed out.  PT Goal Formulation: With patient/family Time For Goal Achievement: 10/30/16 Potential to Achieve Goals: Good    Frequency Min 3X/week       End of Session   Activity Tolerance: Patient limited by pain Patient left: in bed;with call bell/phone within reach;with bed alarm set;with family/visitor present Nurse Communication: Mobility status;Other (comment) (pt having chest pain)    Functional Assessment Tool Used: assist level Functional Limitation: Mobility: Walking and moving around Mobility: Walking and Moving Around Current Status [phone]): At least 1 percent but less than 20 percent impaired, limited or restricted Mobility: Walking and Moving Around Goal Status [phone]): 0 percent impaired, limited or restricted    Time: [id number] PT Time Calculation (min) (ACUTE ONLY): 41 min   Charges:   PT Evaluation $PT Eval Moderate Complexity: 1 Procedure PT  Treatments $Therapeutic Activity: 8-22 mins   PT G Codes:   PT G-Codes **NOT FOR INPATIENT CLASS** Functional Assessment Tool Used: assist level Functional Limitation: Mobility: Walking and moving around Mobility: Walking and Moving Around Current Status [id number]): At least 1 percent but less than 20 percent impaired, limited or restricted Mobility: Walking and Moving Around Goal Status [phone]): 0 percent impaired, limited or restricted    Tzvi Economou B. [place], [place], DPT [phone]   10/16/2016, 12:50 PM

## 2016-10-17 DIAGNOSIS — I248 Other forms of acute ischemic heart disease: Secondary | ICD-10-CM

## 2016-10-17 DIAGNOSIS — R748 Abnormal levels of other serum enzymes: Secondary | ICD-10-CM

## 2016-10-17 DIAGNOSIS — F0150 Vascular dementia without behavioral disturbance: Secondary | ICD-10-CM

## 2016-10-17 DIAGNOSIS — I6529 Occlusion and stenosis of unspecified carotid artery: Secondary | ICD-10-CM

## 2016-10-17 LAB — CBC
HEMATOCRIT: 44.2 % (ref 36.0–46.0)
Hemoglobin: 14.5 g/dL (ref 12.0–15.0)
MCH: 30.1 pg (ref 26.0–34.0)
MCHC: 32.8 g/dL (ref 30.0–36.0)
MCV: 91.7 fL (ref 78.0–100.0)
Platelets: 191 10*3/uL (ref 150–400)
RBC: 4.82 MIL/uL (ref 3.87–5.11)
RDW: 14.1 % (ref 11.5–15.5)
WBC: 10.3 10*3/uL (ref 4.0–10.5)

## 2016-10-17 LAB — HEMOGLOBIN A1C
Hgb A1c MFr Bld: 5.6 % (ref 4.8–5.6)
MEAN PLASMA GLUCOSE: 114 mg/dL

## 2016-10-17 LAB — BASIC METABOLIC PANEL
ANION GAP: 11 (ref 5–15)
BUN: 14 mg/dL (ref 6–20)
CO2: 30 mmol/L (ref 22–32)
Calcium: 8.6 mg/dL — ABNORMAL LOW (ref 8.9–10.3)
Chloride: 99 mmol/L — ABNORMAL LOW (ref 101–111)
Creatinine, Ser: 0.75 mg/dL (ref 0.44–1.00)
GFR calc non Af Amer: >60 mL/min (ref >60)
GLUCOSE: 123 mg/dL — AB (ref 65–99)
Potassium: 3.2 mmol/L — ABNORMAL LOW (ref 3.5–5.1)
Sodium: 140 mmol/L (ref 135–145)

## 2016-10-17 LAB — TROPONIN I: TROPONIN I: 0.05 ng/mL — AB (ref <0.03)

## 2016-10-17 MED ORDER — HYDRALAZINE HCL 10 MG PO TABS
10.0000 mg | ORAL_TABLET | Freq: Three times a day (TID) | ORAL | Status: DC
  Administered 2016-10-17: 10 mg via ORAL
  Filled 2016-10-17: qty 1

## 2016-10-17 MED ORDER — POTASSIUM CHLORIDE CRYS ER 20 MEQ PO TBCR
40.0000 meq | EXTENDED_RELEASE_TABLET | Freq: Two times a day (BID) | ORAL | Status: AC
  Administered 2016-10-17 (×2): 40 meq via ORAL
  Filled 2016-10-17 (×2): qty 2

## 2016-10-17 MED ORDER — LISINOPRIL 10 MG PO TABS
10.0000 mg | ORAL_TABLET | Freq: Every day | ORAL | Status: DC
  Administered 2016-10-17 – 2016-10-21 (×5): 10 mg via ORAL
  Filled 2016-10-17 (×5): qty 1

## 2016-10-17 NOTE — Evaluation (Signed)
Occupational Therapy Evaluation Patient Details Name: Dawn Woods MRN: [id number] DOB: 1930-09-14 Today's Date: 10/17/2016    History of Present Illness 83 y.o. female admitted to [facility] on 10/15/16 for increased confusion, fall.  Dx with questionable UTI and dehydration with mildly elevated troponins.  Medical and cardiac workup in progress.  Pt with signifciant PMHx of dementia, HTN, and L wrist fx surgery.    Clinical Impression   Patient presenting with decreased ADL and functional mobility independence and safety. Patient independent to mod I  PTA. Patient currently functioning at an overall min assist level. Patient will benefit from acute OT to increase overall independence in the areas of ADLs, functional mobility, and overall safety in order to safely discharge back to ALF with 24/7.     Follow Up Recommendations  Home health OT (at [facility]);Supervision/Assistance - 24 hour    Equipment Recommendations   (TBD)    Recommendations for Other Services  none at this time   Precautions / Restrictions Precautions Precautions: Fall Precaution Comments: recent h/o falls Restrictions Weight Bearing Restrictions: No    Mobility Bed Mobility General bed mobility comments: Pt found seated in recliner, see PT eval for more information  Transfers Overall transfer level: Needs assistance Equipment used: 1 person hand held assist Transfers: Sit to/from Stand Sit to Stand: Min guard;Min assist         General transfer comment: for safety    Balance Overall balance assessment: Needs assistance Sitting-balance support: Feet supported;Bilateral upper extremity supported Sitting balance-Leahy Scale: Fair     Standing balance support: Single extremity supported Standing balance-Leahy Scale: Fair Standing balance comment: seems to need some support to steady herself.     ADL Overall ADL's : Needs assistance/impaired Eating/Feeding: Set up;Sitting   Grooming: Set  up;Sitting   Upper Body Bathing: Minimal assistance;Sitting   Lower Body Bathing: Minimal assistance;Sit to/from stand   Upper Body Dressing : Minimal assistance;Sitting   Lower Body Dressing: Minimal assistance;Sit to/from stand   Toilet Transfer: Minimal assistance Armed forces technical officer Details (indicate cue type and reason): simulated from [place] and Hygiene: Moderate assistance;Sit to/from Nurse, children's Details (indicate cue type and reason): did not occur   General ADL Comments: Pt overall min assist for tasks and requires min/mod multimodal cueing.     Pertinent Vitals/Pain Pain Assessment: Faces Faces Pain Scale: Hurts even more Pain Location: chest Pain Descriptors / Indicators: Grimacing (restless in chair) Pain Intervention(s): Limited activity within patient's tolerance;Monitored during session;Repositioned     Hand Dominance Right   Extremity/Trunk Assessment Upper Extremity Assessment Upper Extremity Assessment: Generalized weakness   Lower Extremity Assessment Lower Extremity Assessment: Defer to PT evaluation   Cervical / Trunk Assessment Cervical / Trunk Assessment: Other exceptions Cervical / Trunk Exceptions: pt with acute back pain, no signs of bruising, x-ray negative for fx   Communication Communication Communication: No difficulties   Cognition Arousal/Alertness: Awake/alert Behavior During Therapy: Restless Overall Cognitive Status: No family/caregiver present to determine baseline cognitive functioning General Comments: Pt able to state her name and her daughers name. Pt not oriented to place, situation, or time.               Home Living Family/patient expects to be discharged to:: Assisted living (At [facility] in memory care unit) Additional Comments: Per daughter's report she is independent at baseline, does her own bathing and dressing, walks without assitance or assistive device, and sets  all of the tables for the other  residents before meals every day.       Prior Functioning/Environment Level of Independence: Independent          OT Problem List: Decreased strength;Decreased activity tolerance;Impaired balance (sitting and/or standing);Decreased coordination;Decreased cognition;Decreased safety awareness;Decreased knowledge of use of DME or AE;Decreased knowledge of precautions;Pain   OT Treatment/Interventions: Self-care/ADL training;Therapeutic exercise;Energy conservation;DME and/or AE instruction;Therapeutic activities;Cognitive remediation/compensation;Patient/family education;Balance training    OT Goals(Current goals can be found in the care plan section) Acute Rehab OT Goals Patient Stated Goal: none stated OT Goal Formulation: Patient unable to participate in goal setting Time For Goal Achievement: 10/31/16 Potential to Achieve Goals: Fair ADL Goals Pt Will Perform Upper Body Bathing: with supervision;sitting Pt Will Perform Lower Body Bathing: with supervision;sit to/from stand Pt Will Perform Upper Body Dressing: with supervision;sitting Pt Will Perform Lower Body Dressing: with supervision;sit to/from stand Pt Will Transfer to Toilet: with supervision;ambulating Pt/caregiver will Perform Home Exercise Program: Increased strength;With Supervision;With written HEP provided  OT Frequency: Min 2X/week   Barriers to D/C: Unsure, pt from assisted living. At time of d/c, anticipate pt will need 24/7.    End of Session Equipment Utilized During Treatment: Gait belt  Activity Tolerance: Patient tolerated treatment well Patient left: in chair;with call bell/phone within reach;with chair alarm set;with nursing/sitter in room   Time: 1249-1310 OT Time Calculation (min): 21 min Charges:  OT Evaluation $OT Eval Moderate Complexity: 1 Procedure  Chrys Racer , MS, OTR/L, CLT Pager: [phone] 10/17/2016, 1:32 PM

## 2016-10-17 NOTE — Consult Note (Signed)
Patient ID: Dawn Woods MRN: [id number], DOB/AGE: [birth year]   Admit date: 10/15/2016   Reason for Consult: Elevated Troponin Requesting MD: Dr. Wyline Copas, Internal Medicine  Primary Physician: [name], MD Primary Cardiologist: Dr. Marlou Porch  Pt. Profile:  83 y.o. female with history of dementia, HOCM, HTN, HLD, Hypothyroidism, carotid stenosis and previous tobacco use who was admitted to the [facility] hospital on 1/20 for acute encephalopathy in the setting of UTI and dehydration with ongoing history of dementia. Cardiology consulted for elevated troponin.   Problem List  Past Medical History:  Diagnosis Date  . GERD (gastroesophageal reflux disease)   . H/O: hematuria 07/2010  . Hyperlipidemia   . Hypertension   . Hypothyroidism   . PONV (postoperative nausea and vomiting)     Past Surgical History:  Procedure Laterality Date  . APPENDECTOMY     age 50  . CHOLECYSTECTOMY     age 55  . DILATION AND CURETTAGE OF UTERUS     multiple's  . ESOPHAGOGASTRODUODENOSCOPY N/A 08/06/2013   Procedure: ESOPHAGOGASTRODUODENOSCOPY (EGD);  Surgeon: Lear Ng, MD;  Location: [facility];  Service: Endoscopy;  Laterality: N/A;  . FRACTURE SURGERY  Dec 2009   left wrist  . OOPHORECTOMY  1954  . TONSILLECTOMY     age 27     Allergies  Allergies  Allergen Reactions  . Aspirin Other (See Comments)    GI Bleed  . Ibuprofen Other (See Comments)    GI Bleed  . Ciprofloxacin Hives    HPI  Dawn Woods is a 83 y.o. female with history of dementia, HOCM, HTN, HLD, Hypothyroidism, carotid stenosis, AAA 3.2cm, and previous tobacco use who was admitted to the [facility] hospital on 1/20 for acute encephalopathy in the setting of UTI and dehydration with ongoing history of dementia.   2D Echo 11/2015 showed LV EF: 65%-70%, dynamic obstruction at restin the outflow tract, with a peak velocity of 330 cm/sec and a peak gradient of 44 mm Hg. No RWMA and G1DD. Moderate systolic  anterior motion of the anterior leaflet. Mod MR, small to mod pericardial effusion was identified anterior to the heart with no evidence of hemodynamic compromise. She denied CP at that time. No h/o syncope, prior MI or CVA. Dr. Marlou Porch saw her in consultation and recommended initiation of a beta blocker for her HOCM. She was placed on metoprolol, 12.5 mg BID. She was lost to f/u. No seen by our practice since.   As outlined above, she presented from nursing facility with AMS. The patient was brought to [facility]. In the ED her BP was mildly elevated at 158/99, HR 84, RR 22, 94% on 2L, and afebrile. The patient CXR showed COPD with no active disease or CP changes. ECG showed bigeminy. Tn elevated at 0.08. BUN 24, Cr 0.88. K low. Patient was admitted to the IM service. Tn cycled: 0.08, 0.07, 0.07, 0.06, 0.06, 0.05, 0.05. Echo showed LVEF 60-65%, basal septal hypertrophy, G1DD, with normal wall motion. Mg wnl, 1.7. K low 3.2. BP running high, last 181/91. HR been running upper 90's and low 100's. Cardiology consulted for abnormal troponin. Of note, her BB was held on discharge, given low pulse readings in the 30s-40s.   She continues to deny chest pain. Appears comfortable at rest.     Home Medications  Prior to Admission medications   Medication Sig Start Date End Date Taking? Authorizing Provider  acetaminophen (TYLENOL) 325 MG tablet Take 650 mg by mouth every 4 (four) hours as  needed (for pain).   Yes Historical Provider, MD  cetirizine (ZYRTEC) 10 MG tablet Take 10 mg by mouth daily.   Yes Historical Provider, MD  Cranberry 425 MG CAPS Take 425 mg by mouth 2 (two) times daily.   Yes Historical Provider, MD  escitalopram (LEXAPRO) 10 MG tablet Take 15 mg by mouth daily.   Yes Historical Provider, MD  levothyroxine (SYNTHROID, LEVOTHROID) 75 MCG tablet Take 75 mcg by mouth daily before breakfast.   Yes Historical Provider, MD  pantoprazole (PROTONIX) 40 MG tablet TAKE 1 TABLET TWICE A DAY 06/24/16  Yes  Marletta Lor, MD  simvastatin (ZOCOR) 10 MG tablet Take 1 tablet (10 mg total) by mouth at bedtime. 08/05/15  Yes Marletta Lor, MD  Sodium Fluoride (PREVIDENT 5000 PLUS DT) Place 1 application onto teeth every evening.   Yes Historical Provider, MD  triamcinolone (NASACORT AQ) 55 MCG/ACT AERO nasal inhaler Place 1 spray into the nose daily.   Yes Historical Provider, MD  Vitamin D, Ergocalciferol, (DRISDOL) 50000 units CAPS capsule Take 50,000 Units by mouth every Thursday.   Yes Historical Provider, MD  acetaminophen (TYLENOL) 500 MG tablet Take 1 tablet (500 mg total) by mouth every 6 (six) hours as needed for mild pain. Patient not taking: Reported on 10/15/2016 11/27/15   [name], MD  feeding supplement, ENSURE ENLIVE, (ENSURE ENLIVE) LIQD Take 237 mLs by mouth 2 (two) times daily between meals. Patient not taking: Reported on 10/15/2016 11/27/15   [name], MD  ferrous sulfate 325 (65 FE) MG tablet Take 325 mg by mouth every evening.     Historical Provider, MD  guaiFENesin-codeine (ROBITUSSIN AC) 100-10 MG/5ML syrup Take 10 mLs by mouth 4 (four) times daily as needed for cough.    Historical Provider, MD  levothyroxine (SYNTHROID, LEVOTHROID) 100 MCG tablet Take 1 tablet (100 mcg total) by mouth daily before breakfast. Patient not taking: Reported on 10/15/2016 11/27/15   [name], MD  meclizine (ANTIVERT) 25 MG tablet Take 25 mg by mouth every 4 (four) hours as needed. Dizziness 10/26/15   Historical Provider, MD  metoprolol tartrate (LOPRESSOR) 25 MG tablet Take 0.5 tablets (12.5 mg total) by mouth 2 (two) times daily. Patient not taking: Reported on 10/15/2016 11/28/15   [name], MD  mometasone (NASONEX) 50 MCG/ACT nasal spray Place 2 sprays into the nose daily.    Historical Provider, MD   Hospital Meds . cefTRIAXone (ROCEPHIN) IVPB 1 gram/50 mL D5W  1 g Intravenous Q24H  . escitalopram  15 mg Oral Daily  . guaiFENesin  600 mg Oral BID  .  hydrALAZINE  10 mg Oral Q8H  . levothyroxine  75 mcg Oral QAC breakfast  . lisinopril  10 mg Oral Daily  . potassium chloride  40 mEq Oral BID  . simvastatin  10 mg Oral QHS  . sodium chloride flush  3 mL Intravenous Q12H   Family History  Family History  Problem Relation Age of Onset  . Thyroid disease Mother   . Dementia Mother   . Thyroid disease Sister 8    Social History  Social History   Social History  . Marital status: Widowed    Spouse name: N/A  . Number of children: 3  . Years of education: N/A   Occupational History  . Not on file.   Social History Main Topics  . Smoking status: Former Smoker    Packs/day: 0.50    Years: 63.00  Types: Cigarettes    Quit date: 08/05/2013  . Smokeless tobacco: Never Used  . Alcohol use No  . Drug use: No  . Sexual activity: Not on file   Other Topics Concern  . Not on file   Social History Narrative  . No narrative on file     Review of Systems General:  No chills, fever, night sweats or weight changes.  Cardiovascular:  No chest pain, dyspnea on exertion, edema, orthopnea, palpitations, paroxysmal nocturnal dyspnea. Dermatological: No rash, lesions/masses Respiratory: No cough, dyspnea Urologic: No hematuria, dysuria Abdominal:   No nausea, vomiting, diarrhea, bright red blood per rectum, melena, or hematemesis Neurologic:  No visual changes, wkns, changes in mental status. All other systems reviewed and are otherwise negative except as noted above.  Physical Exam  Blood pressure (!) 181/91, pulse 92, temperature 97.9 F (36.6 C), temperature source Oral, resp. rate 18, height 5\' 6"  (1.676 m), weight 149 lb (67.6 kg), SpO2 91 %.  General: Pleasant, NAD, dementia at baseline  Psych: Normal affect. Neuro: Alert. Moves all extremities spontaneously. Dementia at baseline  HEENT: Normal  Neck: Supple without bruits or JVD. Lungs:  Resp regular and unlabored, CTA. Heart: RRR 1/6 SM along LSB Abdomen: Soft,  non-tender, non-distended, BS + x 4.  Extremities: No clubbing, cyanosis or edema. DP/PT/Radials 2+ and equal bilaterally.  Labs  Troponin (Point of Care Test) No results for input(s): TROPIPOC in the last 72 hours.  Recent Labs  10/16/16 1226 10/16/16 1407 10/16/16 1922 10/17/16 0149  TROPONINI 0.06* 0.06* 0.05* 0.05*   Lab Results  Component Value Date   WBC 10.3 10/17/2016   HGB 14.5 10/17/2016   HCT 44.2 10/17/2016   MCV 91.7 10/17/2016   PLT 191 10/17/2016     Recent Labs Lab 10/16/16 0320 10/17/16 0149  NA 142 140  K 3.5 3.2*  CL 102 99*  CO2 32 30  BUN 15 14  CREATININE 0.79 0.75  CALCIUM 8.9 8.6*  PROT 6.5  --   BILITOT 1.5*  --   ALKPHOS 59  --   ALT 22  --   AST 25  --   GLUCOSE 120* 123*   Lab Results  Component Value Date   CHOL 157 05/01/2015   HDL 58.80 05/01/2015   LDLCALC 79 05/01/2015   TRIG 96.0 05/01/2015   No results found for: DDIMER   Radiology/Studies  Dg Chest 2 View  Result Date: 10/15/2016 CLINICAL DATA:  Fall 5 days ago. Right chest pain. Initial encounter. EXAM: CHEST  2 VIEW COMPARISON:  11/26/2015 FINDINGS: The heart size and mediastinal contours are within normal limits. Both lungs are clear. Pulmonary hyperinflation again seen, consistent with COPD. IMPRESSION: Stable exam.  COPD.  No active disease. Electronically Signed   By: Earle Gell M.D.   On: 10/15/2016 17:47   Ct Head Wo Contrast  Result Date: 10/15/2016 CLINICAL DATA:  Fall 5 days ago. Worsening confusion. Altered mental status. EXAM: CT HEAD WITHOUT CONTRAST TECHNIQUE: Contiguous axial images were obtained from the base of the skull through the vertex without intravenous contrast. COMPARISON:  11/23/2015 FINDINGS: Brain: No evidence of acute infarction, hemorrhage, hydrocephalus, extra-axial fluid collections, or mass lesion/mass effect. Mild diffuse cerebral atrophy and severe chronic small vessel disease are again demonstrated. Symmetric increased prominence of  extra-axial CSF spaces noted bilaterally, consistent with small subdural hygromas. No evidence of mass effect. Vascular: No hyperdense vessel or unexpected calcification. Skull: Normal. Negative for fracture or focal lesion. Sinuses/Orbits: No acute  finding. Other: None. IMPRESSION: No acute intracranial findings. Small bilateral diffuse subdural hygromas, mildly increased in size since previous study. Diffuse cerebral atrophy and chronic small vessel disease. Electronically Signed   By: Earle Gell M.D.   On: 10/15/2016 17:44    ECG Ventricular bigeminy   Echocardiogram  Study Conclusions  - Left ventricle: The cavity size was normal. Wall thickness was   increased in a pattern of mild LVH. There was moderate focal   basal hypertrophy of the septum. Systolic function was normal.   The estimated ejection fraction was in the range of 60% to 65%.   Wall motion was normal; there were no regional wall motion   abnormalities. Doppler parameters are consistent with abnormal   left ventricular relaxation (grade 1 diastolic dysfunction). - Aortic valve: Mildly calcified annulus. Trileaflet; mildly   calcified leaflets. There was mild stenosis. There was trivial   regurgitation. - Mitral valve: Mildly thickened leaflets . There was mild   regurgitation. - Right atrium: Central venous pressure (est): 3 mm Hg. - Atrial septum: No defect or patent foramen ovale was identified. - Tricuspid valve: There was mild regurgitation. - Pulmonary arteries: PA peak pressure: 33 mm Hg (S). - Pericardium, extracardiac: A small to moderate pericardial   effusion was identified anterior to the heart.  Impressions:  - Mild LVH with moderate basal septal hypertrophy and LVEF 60-65%.   Grade 1 diastolic dysfunction. Calcified mitral annulus with mild   mitral regurgitation. Mild calcific aortic stenosis with trivial   aortic regurgitation. Mild tricuspid regurgitation with PASP 33   mmHg. Small to moderate  anterior pericardial effusion with   evidence of organization suggesting chronicity.    ASSESSMENT AND PLAN  83 y.o. female with history of dementia, HOCM, HTN, HLD, Hypothyroidism, carotid stenosis, AAA 3.2cm, and previous tobacco use who was admitted to the [facility] hospital on 1/20 for acute encephalopathy in the setting of UTI and dehydration with ongoing history of dementia. Cardiology consulted for mildly elevated troponin.    1. Elevated Troponin: flat low level trend, not c/w ACS. She denies CP and 2D echo shows normal LVEF and wall motion. Suspect demand ischemia. No further inpatient cardiac w/u indicated.   2. H/o HOCM: Echo shows LVEF 60-65%, basal septal hypertrophy, G1DD, with normal wall motion. BB held on admit given concerns for bradycardia. However, suspect low pulse readings were secondary to frequent PVCs/ ventricular bigeminy. We think that she would benefit from BB being resumed at a low dose. This will help with her HOCM and suppression of her PVCs.   3. HLD: Continue Simvastatin. Order Lipid panel. Last checked 05/01/15  4. UTI: on antibiotics per IM.   5. Hyperthyroidism: Per IM TSH elevated at 9.715. T4 normal.   6. Carotid Artery Stenosis: CT from 2017 shows carotid artery stenosis. Carotid Vas US showed on 11/25/15 showed >50% stenosis involving the left external carotid artery.  Asymptomatic.   Signed, Lyda Jester, PA-C 10/17/2016, 10:46 AM   Personally seen and examined. Agree with above.  Demand ischemia - mildly elevated flat troponin EF normal.  Bigeminy (likely reason for "low pulse") OK with restarting metoprolol. She is comfortable in chair, no complaints. Right sided CP was from compression after fall.   Will sign off. No need for cardiology follow up.   Candee Furbish, MD

## 2016-10-17 NOTE — Progress Notes (Signed)
Nutrition Brief Note  Patient identified on the Malnutrition Screening Tool (MST) Report  Wt Readings from Last 15 Encounters:  10/15/16 149 lb (67.6 kg)  01/11/16 149 lb 4 oz (67.7 kg)  01/05/16 149 lb 4 oz (67.7 kg)  11/30/15 144 lb 8 oz (65.5 kg)  11/26/15 174 lb 9.6 oz (79.2 kg)  11/25/15 174 lb (78.9 kg)  11/18/15 160 lb (72.6 kg)  05/01/15 152 lb (68.9 kg)  08/25/14 148 lb (67.1 kg)  06/05/14 148 lb (67.1 kg)  04/03/14 147 lb (66.7 kg)  02/12/14 144 lb (65.3 kg)  01/29/14 143 lb (64.9 kg)  12/18/13 141 lb (64 kg)  09/18/13 130 lb (59 kg)   83 y.o. female with medical history significant of Dementia, hypertrophic obstructive cardiomyopathy, hypothyroidism history of hypertension now resolved, GERD admitted for Acute encephalopathy in the setting of possible UTI and dehydration associated with elevated troponin   Pt reports fair appetite, intake is variable depending on what she is served. She denies anyb weight loss.   Reviewed records from [facility]. Pt on a regular diet PTA. Pt denies any difficulty chewing or swallowing. Pt reports she did not eat lunch today, however, with hx of dementia.   Pt has been stable; denies wt loss. UBW around 148#, Suspect wts in the 160-170# range are likely outliers.   Nutrition-Focused physical exam completed. Findings are no fat depletion, no muscle depletion, and no edema.   Body mass index is 24.05 kg/m. Patient meets criteria for normal weight range based on current BMI.   Current diet order is Heart Healthy, patient is consuming approximately n/a% of meals at this time. Labs and medications reviewed.   No nutrition interventions warranted at this time. If nutrition issues arise, please consult RD.   Xzavion Doswell A. Jimmye Norman, RD, LDN, CDE Pager: [phone] After hours Pager: [phone]

## 2016-10-17 NOTE — Progress Notes (Signed)
PROGRESS NOTE    Dawn Woods  [id number] DOB: 30-Mar-1930 DOA: 10/15/2016 PCP: [name], MD    Brief Narrative:  83 y.o. female with medical history significant of Dementia, hypertrophic obstructive cardiomyopathy, hypothyroidism history of hypertension now resolved, GERD    Presented with worsening confusion since last night, she lives in assisted living and have been unable to recognize familiar faces asking for breakfast in the middle of the night. She fell on Tuesday 16th was found down. Was worrisome if she hit her head or at least face since she had abrasions to her face. No history of being on blood thinners She refused to to to ER. She's been reporting some back pain Her vitals were monitored and have been stable.  She have had xrays of her back no fractures were noted.  Patient have denied any chest pain when questioning she states "I am fine" Family denies any fever, she is prone to UTI's in the past in February patient have had similar admission and was found to have UTI.  Nursing staff was concerned patient was taken to [facility]  Assessment & Plan:   Active Problems:   Hypothyroidism   GERD   Urinary tract infection   Acute encephalopathy   Hypokalemia   Dehydration   Lower urinary tract infectious disease   Dementia without behavioral disturbance   Confusion   Elevated troponin   1. Toxic metabolic encephalopathy 1. suspect related to dehydration vs HTN vs worsening dementia 2. Mental status appears improved 3. Will continue IVF hydration 4. Continue to treat BP per below 5. Doubt UTI given no leuks, no nitrite, no WBC. Many bacteria noted, however more suggestive of bacturia  6. Urine culture is growing >100,000 gm neg rods 7. Discussed with ID. Agrees to discontinue abx. Likely bacturia, not true UTI 2. Dementia 1. Appears stable at present 2. Continue to monitor 3. Orthostasis 1. Orthostatic vitals reviewed 2. 0000000  systolic drop from laying to sitting and 57mm drop from sitting to standing noted 3. Continue IVF as tolerated 4. LIkely dehydration 1. Per above, orthostatics positive 2. UA pos for ketones. 3. CBC profile with higher WBC, HGB, and Plts over baseline suggesting concentrated CBC 4. Hygromas noted on head CT, which may be attributed to dehydration 5. Patient is continued on IVF as tolerated 5. Elevated troponin 1. Trending down 2. Doubt ACS. Chest pain is very reproducible on exam 3. Input by cardiology appreciated. Likely trop leak 6. Hypothyroid 1. Continue thyroid replacement as tolerated 2. TSH currently 9.75, down from 16.88 in 2/27 3. Ordered free T4, which is normal 4. Continue replacement as tolerated 7. HTN 1. BP poorly controlled with SBP into the 200's 2. Renal function stable 3. Avoid beta blockers given asymptomatic bradycardia 4. Have started lisinopril 5mg . Overnight blood pressures remain poor controlled 5. Have increased lisinopril to 10mg . Initially added PO hydralazine, however bp currently better controlled, thus will cont on lisinopril 10mg  for now  DVT prophylaxis: SCD's Code Status: Full Family Communication: Pt in room, family at bedside Disposition Plan: Uncertain at this time  Consultants:   Cardiology  Discussed case with ID over phone  Procedures:     Antimicrobials: Anti-infectives    Start     Dose/Rate Route Frequency Ordered Stop   10/16/16 1900  cefTRIAXone (ROCEPHIN) 1 g in dextrose 5 % 50 mL IVPB     1 g 100 mL/hr over 30 Minutes Intravenous Every 24 hours 10/15/16 2208  10/15/16 1915  cefTRIAXone (ROCEPHIN) 1 g in dextrose 5 % 50 mL IVPB     1 g 100 mL/hr over 30 Minutes Intravenous  Once 10/15/16 1909 10/15/16 2018      Subjective: No complaints this AM  Objective: Vitals:   10/16/16 1231 10/16/16 1545 10/16/16 2157 10/17/16 0556  BP:  (!) 168/103 (!) 175/97 (!) 181/91  Pulse: (!) 109 90 73 92  Resp:  (!) 21 (!) 24 18    Temp:  97.8 F (36.6 C) 97.5 F (36.4 C) 97.9 F (36.6 C)  TempSrc:  Oral Oral Oral  SpO2: 95% 98% 94% 91%  Weight:      Height:        Intake/Output Summary (Last 24 hours) at 10/17/16 1124 Last data filed at 10/17/16 [id number]  Gross per 24 hour  Intake             1370 ml  Output                0 ml  Net             1370 ml   Filed Weights   10/15/16 1622  Weight: 67.6 kg (149 lb)    Examination:  General exam: Laying in bed, in nad Respiratory system: Normal chest rise, no audible wheezing Cardiovascular system: regular rate, s1-s2 Gastrointestinal system: soft, nondistended, pos BS Central nervous system: cn2-12 grossly intact, strength intact Extremities: perfused, no clubbing Skin: no rashes, no pallor Psychiatry: mood normal// no visual hallucinations   Data Reviewed: I have personally reviewed following labs and imaging studies  CBC:  Recent Labs Lab 10/15/16 1705 10/16/16 0320 10/17/16 0149  WBC 11.5* 14.5* 10.3  NEUTROABS 8.7*  --   --   HGB 15.1* 15.6* 14.5  HCT 46.8* 48.2* 44.2  MCV 92.5 92.2 91.7  PLT 239 241 [id number]   Basic Metabolic Panel:  Recent Labs Lab 10/15/16 1705 10/16/16 0320 10/17/16 0149  NA 139 142 140  K 3.1* 3.5 3.2*  CL 99* 102 99*  CO2 32 32 30  GLUCOSE 124* 120* 123*  BUN 24* 15 14  CREATININE 0.88 0.79 0.75  CALCIUM 9.0 8.9 8.6*  MG  --  1.7  --   PHOS  --  2.3*  --    GFR: Estimated Creatinine Clearance: 49 mL/min (by C-G formula based on SCr of 0.75 mg/dL). Liver Function Tests:  Recent Labs Lab 10/15/16 1705 10/16/16 0320  AST 23 25  ALT 21 22  ALKPHOS 64 59  BILITOT 1.3* 1.5*  PROT 7.0 6.5  ALBUMIN 3.8 3.6   No results for input(s): LIPASE, AMYLASE in the last 168 hours. No results for input(s): AMMONIA in the last 168 hours. Coagulation Profile: No results for input(s): INR, PROTIME in the last 168 hours. Cardiac Enzymes:  Recent Labs Lab 10/16/16 0320 10/16/16 1226 10/16/16 1407 10/16/16 1922  10/17/16 0149  TROPONINI 0.07* 0.06* 0.06* 0.05* 0.05*   BNP (last 3 results) No results for input(s): PROBNP in the last 8760 hours. HbA1C:  Recent Labs  10/16/16 0327  HGBA1C 5.6   CBG:  Recent Labs Lab 10/15/16 1644  GLUCAP 107*   Lipid Profile: No results for input(s): CHOL, HDL, LDLCALC, TRIG, CHOLHDL, LDLDIRECT in the last 72 hours. Thyroid Function Tests:  Recent Labs  10/16/16 0320 10/16/16 1226  TSH 9.715*  --   FREET4  --  1.08   Anemia Panel: No results for input(s): VITAMINB12, FOLATE, FERRITIN, TIBC, IRON, RETICCTPCT in  the last 72 hours. Sepsis Labs:  Recent Labs Lab 10/15/16 1727  LATICACIDVEN 1.73    Recent Results (from the past 240 hour(s))  Urine culture     Status: Abnormal (Preliminary result)   Collection Time: 10/15/16  6:25 PM  Result Value Ref Range Status   Specimen Description URINE, CATHETERIZED  Final   Special Requests NONE  Final   Culture >=100,000 COLONIES/mL GRAM NEGATIVE RODS (A)  Final   Report Status PENDING  Incomplete  MRSA PCR Screening     Status: None   Collection Time: 10/15/16  9:50 PM  Result Value Ref Range Status   MRSA by PCR NEGATIVE NEGATIVE Final    Comment:        The GeneXpert MRSA Assay (FDA approved for NASAL specimens only), is one component of a comprehensive MRSA colonization surveillance program. It is not intended to diagnose MRSA infection nor to guide or monitor treatment for MRSA infections.      Radiology Studies: Dg Chest 2 View  Result Date: 10/15/2016 CLINICAL DATA:  Fall 5 days ago. Right chest pain. Initial encounter. EXAM: CHEST  2 VIEW COMPARISON:  11/26/2015 FINDINGS: The heart size and mediastinal contours are within normal limits. Both lungs are clear. Pulmonary hyperinflation again seen, consistent with COPD. IMPRESSION: Stable exam.  COPD.  No active disease. Electronically Signed   By: Earle Gell M.D.   On: 10/15/2016 17:47   Ct Head Wo Contrast  Result Date:  10/15/2016 CLINICAL DATA:  Fall 5 days ago. Worsening confusion. Altered mental status. EXAM: CT HEAD WITHOUT CONTRAST TECHNIQUE: Contiguous axial images were obtained from the base of the skull through the vertex without intravenous contrast. COMPARISON:  11/23/2015 FINDINGS: Brain: No evidence of acute infarction, hemorrhage, hydrocephalus, extra-axial fluid collections, or mass lesion/mass effect. Mild diffuse cerebral atrophy and severe chronic small vessel disease are again demonstrated. Symmetric increased prominence of extra-axial CSF spaces noted bilaterally, consistent with small subdural hygromas. No evidence of mass effect. Vascular: No hyperdense vessel or unexpected calcification. Skull: Normal. Negative for fracture or focal lesion. Sinuses/Orbits: No acute finding. Other: None. IMPRESSION: No acute intracranial findings. Small bilateral diffuse subdural hygromas, mildly increased in size since previous study. Diffuse cerebral atrophy and chronic small vessel disease. Electronically Signed   By: Earle Gell M.D.   On: 10/15/2016 17:44    Scheduled Meds: . cefTRIAXone (ROCEPHIN) IVPB 1 gram/50 mL D5W  1 g Intravenous Q24H  . escitalopram  15 mg Oral Daily  . guaiFENesin  600 mg Oral BID  . hydrALAZINE  10 mg Oral Q8H  . levothyroxine  75 mcg Oral QAC breakfast  . lisinopril  10 mg Oral Daily  . potassium chloride  40 mEq Oral BID  . simvastatin  10 mg Oral QHS  . sodium chloride flush  3 mL Intravenous Q12H   Continuous Infusions: . sodium chloride 75 mL/hr at 10/16/16 1425     LOS: 1 day   Lashaya Kienitz, Orpah Melter, MD [facility] Pager [phone]  If 7PM-7AM, please contact night-coverage [email or web address] Password [facility] 10/17/2016, 11:24 AM

## 2016-10-17 NOTE — Consult Note (Deleted)
Cardiology Consultation Note    Patient ID: Dawn Woods, MRN: [id number], DOB/AGE: Sep 11, 1930 83 y.o. Admit date: 10/15/2016   Date of Consult: 10/17/2016 Primary Physician: [name], MD Primary Cardiologist: Dr. Marlou Porch  Chief Complaint: AMS Reason for Consultation: Elevated Tn Requesting MD: A. Roel Cluck, MD  HPI: Dawn Woods is a 83 y.o. female with history of dementia, HOCM, HTN, HLD, Hypothyroidism, carotid stenosis and previous tobacco use who was admitted to the [facility] hospital on 1/20 for acute encephalopathy in the setting of UTI and dehydration with ongoing history of dementia.   The patients primary cardiologist is Dr. Marlou Porch, who consulted the patient for a similar situation on 11/2015. The patient had a 2D echo done at the time which showed showed LV EF: 65%-70%, dynamic obstruction at restin the outflow tract, with a peak velocity of 330 cm/sec and a peak gradient of 44 mm Hg. No RWMA and G1DD. Moderate systolic anterior motion of the anterior leaflet. Mod MR, small to mod  pericardial effusion was identified anterior to the heart with no evidence of hemodynamic compromise. The patient denies history of prior chest pain, stress test, cardiac cath or family history of cardiac disease.   The patient lives at [place] skilled memory facility. On 1/14, the patient had an unwitnessed fall. She was found passed out on her floor, laying on her right side. On the night of 1/19, the patients daughter noticed a change in her baseline. She began awakening every 30 minutes to get breakfast, and was no longer able to recognize faces. The patient was brought to [facility]. In the ED her BP was mildly elevated at 158/99, HR 84, RR 22, 94% on 2L, and afebrile. The patient CXR showed COPD with no active disease or CP changes. ECG showed bigeminy. Tn elevated at 0.08. BUN 24, Cr 0.88. K low. Patient was admitted to the IM service. Tn cycled: 0.08, 0.07, 0.07, 0.06, 0.06, 0.05, 0.05. Echo  showed LVEF 60-65%, basal septal hypertrophy, G1DD, with normal wall motion. Mg wnl, 1.7. K low 3.2. BP running high, last 181/91. HR been running upper 90's and low 100's.   The patient was chest pain free today. She denied SOB, leg swelling, N/V. The patient ROS was difficult to obtain due to underlying dementia.    Past Medical History:  Diagnosis Date  . GERD (gastroesophageal reflux disease)   . H/O: hematuria 07/2010  . Hyperlipidemia   . Hypertension   . Hypothyroidism   . PONV (postoperative nausea and vomiting)       Surgical History:  Past Surgical History:  Procedure Laterality Date  . APPENDECTOMY     age 17  . CHOLECYSTECTOMY     age 69  . DILATION AND CURETTAGE OF UTERUS     multiple's  . ESOPHAGOGASTRODUODENOSCOPY N/A 08/06/2013   Procedure: ESOPHAGOGASTRODUODENOSCOPY (EGD);  Surgeon: Lear Ng, MD;  Location: [facility];  Service: Endoscopy;  Laterality: N/A;  . FRACTURE SURGERY  Dec 2009   left wrist  . OOPHORECTOMY  1954  . TONSILLECTOMY     age 77     Home Meds: Prior to Admission medications   Medication Sig Start Date End Date Taking? Authorizing Provider  acetaminophen (TYLENOL) 325 MG tablet Take 650 mg by mouth every 4 (four) hours as needed (for pain).   Yes Historical Provider, MD  cetirizine (ZYRTEC) 10 MG tablet Take 10 mg by mouth daily.   Yes Historical Provider, MD  Cranberry 425 MG CAPS Take 425 mg by  mouth 2 (two) times daily.   Yes Historical Provider, MD  escitalopram (LEXAPRO) 10 MG tablet Take 15 mg by mouth daily.   Yes Historical Provider, MD  levothyroxine (SYNTHROID, LEVOTHROID) 75 MCG tablet Take 75 mcg by mouth daily before breakfast.   Yes Historical Provider, MD  pantoprazole (PROTONIX) 40 MG tablet TAKE 1 TABLET TWICE A DAY 06/24/16  Yes Marletta Lor, MD  simvastatin (ZOCOR) 10 MG tablet Take 1 tablet (10 mg total) by mouth at bedtime. 08/05/15  Yes Marletta Lor, MD  Sodium Fluoride (PREVIDENT 5000 PLUS  DT) Place 1 application onto teeth every evening.   Yes Historical Provider, MD  triamcinolone (NASACORT AQ) 55 MCG/ACT AERO nasal inhaler Place 1 spray into the nose daily.   Yes Historical Provider, MD  Vitamin D, Ergocalciferol, (DRISDOL) 50000 units CAPS capsule Take 50,000 Units by mouth every Thursday.   Yes Historical Provider, MD  acetaminophen (TYLENOL) 500 MG tablet Take 1 tablet (500 mg total) by mouth every 6 (six) hours as needed for mild pain. Patient not taking: Reported on 10/15/2016 11/27/15   [name], MD  feeding supplement, ENSURE ENLIVE, (ENSURE ENLIVE) LIQD Take 237 mLs by mouth 2 (two) times daily between meals. Patient not taking: Reported on 10/15/2016 11/27/15   [name], MD  ferrous sulfate 325 (65 FE) MG tablet Take 325 mg by mouth every evening.     Historical Provider, MD  guaiFENesin-codeine (ROBITUSSIN AC) 100-10 MG/5ML syrup Take 10 mLs by mouth 4 (four) times daily as needed for cough.    Historical Provider, MD  levothyroxine (SYNTHROID, LEVOTHROID) 100 MCG tablet Take 1 tablet (100 mcg total) by mouth daily before breakfast. Patient not taking: Reported on 10/15/2016 11/27/15   [name], MD  meclizine (ANTIVERT) 25 MG tablet Take 25 mg by mouth every 4 (four) hours as needed. Dizziness 10/26/15   Historical Provider, MD  metoprolol tartrate (LOPRESSOR) 25 MG tablet Take 0.5 tablets (12.5 mg total) by mouth 2 (two) times daily. Patient not taking: Reported on 10/15/2016 11/28/15   [name], MD  mometasone (NASONEX) 50 MCG/ACT nasal spray Place 2 sprays into the nose daily.    Historical Provider, MD    Inpatient Medications:  . cefTRIAXone (ROCEPHIN) IVPB 1 gram/50 mL D5W  1 g Intravenous Q24H  . escitalopram  15 mg Oral Daily  . guaiFENesin  600 mg Oral BID  . hydrALAZINE  10 mg Oral Q8H  . levothyroxine  75 mcg Oral QAC breakfast  . lisinopril  10 mg Oral Daily  . potassium chloride  40 mEq Oral BID  . simvastatin  10 mg Oral QHS    . sodium chloride flush  3 mL Intravenous Q12H   . sodium chloride 75 mL/hr at 10/16/16 1425    Allergies:  Allergies  Allergen Reactions  . Aspirin Other (See Comments)    GI Bleed  . Ibuprofen Other (See Comments)    GI Bleed  . Ciprofloxacin Hives    Social History   Social History  . Marital status: Widowed    Spouse name: N/A  . Number of children: 3  . Years of education: N/A   Occupational History  . Not on file.   Social History Main Topics  . Smoking status: Former Smoker    Packs/day: 0.50    Years: 63.00    Types: Cigarettes    Quit date: 08/05/2013  . Smokeless tobacco: Never Used  . Alcohol use No  .  Drug use: No  . Sexual activity: Not on file   Other Topics Concern  . Not on file   Social History Narrative  . No narrative on file     Family History  Problem Relation Age of Onset  . Thyroid disease Mother   . Dementia Mother   . Thyroid disease Sister 42     Review of Systems: Difficult to obtain due to underlying dementia and ams. General: negative for chills, fever, night sweats or weight changes.  Cardiovascular: negative for chest pain, edema, orthopnea, palpitations, paroxysmal nocturnal dyspnea, shortness of breath or dyspnea on exertion Dermatological: negative for rash Respiratory: negative for cough or wheezing Urologic: negative for hematuria Abdominal: negative for nausea, vomiting, diarrhea, bright red blood per rectum, melena, or hematemesis Neurologic: negative for visual changes, syncope, or dizziness All other systems reviewed and are otherwise negative except as noted above.  Labs:  Recent Labs  10/16/16 1226 10/16/16 1407 10/16/16 1922 10/17/16 0149  TROPONINI 0.06* 0.06* 0.05* 0.05*   Lab Results  Component Value Date   WBC 10.3 10/17/2016   HGB 14.5 10/17/2016   HCT 44.2 10/17/2016   MCV 91.7 10/17/2016   PLT 191 10/17/2016    Recent Labs Lab 10/16/16 0320 10/17/16 0149  NA 142 140  K 3.5 3.2*  CL  102 99*  CO2 32 30  BUN 15 14  CREATININE 0.79 0.75  CALCIUM 8.9 8.6*  PROT 6.5  --   BILITOT 1.5*  --   ALKPHOS 59  --   ALT 22  --   AST 25  --   GLUCOSE 120* 123*   Lab Results  Component Value Date   CHOL 157 05/01/2015   HDL 58.80 05/01/2015   LDLCALC 79 05/01/2015   TRIG 96.0 05/01/2015   No results found for: DDIMER  Radiology/Studies:  Dg Chest 2 View  Result Date: 10/15/2016 CLINICAL DATA:  Fall 5 days ago. Right chest pain. Initial encounter. EXAM: CHEST  2 VIEW COMPARISON:  11/26/2015 FINDINGS: The heart size and mediastinal contours are within normal limits. Both lungs are clear. Pulmonary hyperinflation again seen, consistent with COPD. IMPRESSION: Stable exam.  COPD.  No active disease. Electronically Signed   By: Earle Gell M.D.   On: 10/15/2016 17:47   Ct Head Wo Contrast  Result Date: 10/15/2016 CLINICAL DATA:  Fall 5 days ago. Worsening confusion. Altered mental status. EXAM: CT HEAD WITHOUT CONTRAST TECHNIQUE: Contiguous axial images were obtained from the base of the skull through the vertex without intravenous contrast. COMPARISON:  11/23/2015 FINDINGS: Brain: No evidence of acute infarction, hemorrhage, hydrocephalus, extra-axial fluid collections, or mass lesion/mass effect. Mild diffuse cerebral atrophy and severe chronic small vessel disease are again demonstrated. Symmetric increased prominence of extra-axial CSF spaces noted bilaterally, consistent with small subdural hygromas. No evidence of mass effect. Vascular: No hyperdense vessel or unexpected calcification. Skull: Normal. Negative for fracture or focal lesion. Sinuses/Orbits: No acute finding. Other: None. IMPRESSION: No acute intracranial findings. Small bilateral diffuse subdural hygromas, mildly increased in size since previous study. Diffuse cerebral atrophy and chronic small vessel disease. Electronically Signed   By: Earle Gell M.D.   On: 10/15/2016 17:44    Wt Readings from Last 3 Encounters:    10/15/16 149 lb (67.6 kg)  01/11/16 149 lb 4 oz (67.7 kg)  01/05/16 149 lb 4 oz (67.7 kg)    EKG: Rate 94, 2nd degree AV block, bigemeny. T wave inversion. QTc 464  Physical Exam: Blood pressure Marland Kitchen)  181/91, pulse 92, temperature 97.9 F (36.6 C), temperature source Oral, resp. rate 18, height 5\' 6"  (1.676 m), weight 149 lb (67.6 kg), SpO2 91 %. Body mass index is 24.05 kg/m. General: Elderly female, well developed, well nourished, in no acute distress. Neck: Negative for carotid bruits. JVD not elevated. Lungs: Clear bilaterally to auscultation without wheezes, rales, or rhonchi. Breathing is unlabored. Heart: RRR with S1 S2. No murmurs, rubs, or gallops appreciated. Chest pain on palpation R side.  Abdomen: Soft, non-tender, non-distended with normoactive bowel sounds. No hepatomegaly. No rebound/guarding. No obvious abdominal masses. Msk:  Moves all extremities grossly  Extremities: No clubbing or cyanosis. No edema.  Distal pedal pulses are 2+ and equal bilaterally. Neuro: Alert and oriented X 3. No facial asymmetry. No focal deficit. Moves all extremities spontaneously. Psych:  Responds to questions appropriately with a normal affect.     Assessment and Plan  1) Elevated Tn Likely not in the setting of ACS.  Patient has not complained of Chest pain. Chest pain on exam is reproducible with palpation. Likely due to fall on 1/14.  ECG was without ST segment changes. Evidence of Bigeminy.  Tn cycled and downtrending: 0.08, 0.07, 0.07, 0.06, 0.06, 0.05, 0.05. CXR showed COPD with no active disease or CP changes. Echo showed LVEF 60-65%, basal septal hypertrophy, G1DD, with normal wall motion. No significant changes since last Echo in 2017.  CT from 2017 shows carotid artery stenosis. Carotid Vas US showed on 11/25/15 showed >50% stenosis involving the left external carotid artery.  Asymptomatic.  Consider outpatient follow up with lexi stress test.   2) Abnormal ECG ED ECG showed  bigeminy.  Mg wnl, 1.7. K low 3.2. Replace K.   3) HTN BP poorly controlled. SBP running in 200's. Last chest 181/91.  Renal function stable. GFR >60. Cr 0.75.  Consider Metoprolol in the setting of HOCM.  Bradycardia possible in the setting of PVC's.  Continue telemetry monitoring.   4) HLD -Continue Simvastatin  -Order Lipid panel. Last checked 05/01/15  4) Hypothyroid - Per IM TSH elevated at 9.715. T4 normal.    SignedJillyn Ledger PA-C 10/17/2016, 9:01 AM Pager: [phone]

## 2016-10-17 NOTE — Clinical Social Work Note (Signed)
Clinical Social Work Assessment  Patient Details  Name: Dawn Woods MRN: [id number] Date of Birth: [birth year]  Date of referral:  10/17/16               Reason for consult:  Discharge Planning                Permission sought to share information with:  Facility Sport and exercise psychologist, Family Supports Permission granted to share information::  No  Name::     Hilda Blades  Agency::  [facility]  Relationship::  Daughter  Contact Information:  [phone]  Housing/Transportation Living arrangements for the past 2 months:  [place] of Information:  Adult Children Patient Interpreter Needed:  None Criminal Activity/Legal Involvement Pertinent to Current Situation/Hospitalization:  No - Comment as needed Significant Relationships:  Adult Children Lives with:  Facility Resident Do you feel safe going back to the place where you live?  Yes Need for family participation in patient care:  Yes (Comment)  Care giving concerns:  CSW received consult for discharge planning. Patient is disoriented. CSW spoke with patient's daughter. She reported that patient resides at [facility] and will return there at discharge. CSW to continue to follow and assist with discharge planning needs.   Social Worker assessment / plan:  Patient to return to ALF by [facility] at discharge.   Employment status:  Retired Health visitor PT Recommendations:  Home with [place] / Referral to community resources:     Patient/Family's Response to care:  Patient's family expressed concerns about when patient would be ready for discharge and that patient did not have any underwear at the hospital. [place] explained that patient would be changed into a paper gown for transport if family did not want to bring clothes.  Patient/Family's Understanding of and Emotional Response to Diagnosis, Current Treatment, and Prognosis:  Patient/family is realistic regarding therapy  needs and expressed being hopeful for return to ALF. Patient's daughter expressed understanding of CSW role and discharge process. No questions/concerns about plan or treatment.    Emotional Assessment Appearance:  Appears stated age Attitude/Demeanor/Rapport:  Unable to Assess Affect (typically observed):  Unable to Assess Orientation:  Oriented to Self, Oriented to Place Alcohol / Substance use:  Not Applicable Psych involvement (Current and /or in the community):  No (Comment)  Discharge Needs  Concerns to be addressed:  Care Coordination Readmission within the last 30 days:  No Current discharge risk:  None Barriers to Discharge:  Continued Medical Work up   [employer], [place] 10/17/2016, 11:16 AM

## 2016-10-18 LAB — URINALYSIS, ROUTINE W REFLEX MICROSCOPIC
Bilirubin Urine: NEGATIVE
Glucose, UA: NEGATIVE mg/dL
Ketones, ur: 5 mg/dL — AB
Leukocytes, UA: NEGATIVE
Nitrite: NEGATIVE
PROTEIN: NEGATIVE mg/dL
Specific Gravity, Urine: 1.018 (ref 1.005–1.030)
pH: 5 (ref 5.0–8.0)

## 2016-10-18 LAB — BASIC METABOLIC PANEL
ANION GAP: 8 (ref 5–15)
BUN: 17 mg/dL (ref 6–20)
CALCIUM: 8.5 mg/dL — AB (ref 8.9–10.3)
CO2: 25 mmol/L (ref 22–32)
Chloride: 108 mmol/L (ref 101–111)
Creatinine, Ser: 0.7 mg/dL (ref 0.44–1.00)
GFR calc Af Amer: >60 mL/min (ref >60)
GLUCOSE: 105 mg/dL — AB (ref 65–99)
Potassium: 4.1 mmol/L (ref 3.5–5.1)
Sodium: 141 mmol/L (ref 135–145)

## 2016-10-18 LAB — TROPONIN I: Troponin I: 0.03 ng/mL (ref <0.03)

## 2016-10-18 MED ORDER — METOPROLOL TARTRATE 12.5 MG HALF TABLET
12.5000 mg | ORAL_TABLET | Freq: Two times a day (BID) | ORAL | Status: DC
Start: 2016-10-18 — End: 2016-10-21
  Administered 2016-10-18 – 2016-10-21 (×7): 12.5 mg via ORAL
  Filled 2016-10-18 (×7): qty 1

## 2016-10-18 NOTE — Progress Notes (Signed)
Physical Therapy Treatment Patient Details Name: Dawn Woods MRN: [id number] DOB: 1930/03/20 Today's Date: 10/18/2016    History of Present Illness 83 y.o. female admitted to [facility] on 10/15/16 for increased confusion, fall.  Dx with questionable UTI and dehydration with mildly elevated troponins.  Medical and cardiac workup in progress.  Pt with signifciant PMHx of dementia, HTN, and L wrist fx surgery.     PT Comments    Pt presented with continued confusion throughout treatment, unable to recall how long her headache has been there nor did she know her nurse's name or why we were in the room. During first trial of ambulation, pt asked to sit down but when asked if she was fatigued or dizzy, she expressed, "no." She displayed labored breathing; O2 sats read 89-91% throughout treatment and HR was 90-94 bpm.  Pt would benefit from continued PT with vitals monitored to increased functional independence to prepare for d/c.   Follow Up Recommendations  Home health PT;Supervision/Assistance - 24 hour (At [facility])     Equipment Recommendations  None recommended by PT    Recommendations for Other Services       Precautions / Restrictions Precautions Precautions: Fall Precaution Comments: recent h/o falls    Mobility  Bed Mobility Overal bed mobility: Needs Assistance Bed Mobility: Supine to Sit;Sit to Supine       Sit to supine: Min assist   General bed mobility comments: min A to help reposition pt higher in bed; cuing required to not get up without being commanded to.   Transfers Overall transfer level: Needs assistance Equipment used: Rolling walker (2 wheeled) Transfers: Sit to/from Stand Sit to Stand: Min guard         General transfer comment: for safety due to checking orthostatics. V/c for hand placement  Ambulation/Gait Ambulation/Gait assistance: Min guard Ambulation Distance (Feet): 60 Feet (+ 60 feet) Assistive device: Rolling walker (2 wheeled) Gait  Pattern/deviations: Staggering right;Staggering left;Narrow base of support;Trunk flexed     General Gait Details: Pt with mildly staggering gait pattern. Pt able to use RW but required repeated v/c to stay within boundaries of walker.  Displayed labored breathing but patient stated she feels fine.   Stairs            Wheelchair Mobility    Modified Rankin (Stroke Patients Only)       Balance Overall balance assessment: Needs assistance Sitting-balance support: Feet supported;Bilateral upper extremity supported Sitting balance-Leahy Scale: Fair     Standing balance support: Single extremity supported Standing balance-Leahy Scale: Fair Standing balance comment: pt able to stand statically with single UE support. Unsteady with dynamic without RW.                     Cognition Arousal/Alertness: Awake/alert Behavior During Therapy: Impulsive;Agitated   Area of Impairment: Orientation;Safety/judgement;Memory;Awareness Orientation Level: Disoriented to;Person;Situation   Memory: Decreased short-term memory   Safety/Judgement: Decreased awareness of safety;Decreased awareness of deficits     General Comments: pt aware of location, name and DOB but unable to recall anything that happened earlier today. Didn't recognize her nurse when asked.  Displayed confused facial expression when asked question or listening to staff but able to follow commands.     Exercises      General Comments        Pertinent Vitals/Pain Pain Assessment: Faces Faces Pain Scale: Hurts a little bit Pain Location: back, headache Pain Descriptors / Indicators: Discomfort;Grimacing;Pressure Pain Intervention(s): Monitored during session;Repositioned  Home Living                      Prior Function            PT Goals (current goals can now be found in the care plan section)      Frequency    Min 3X/week      PT Plan Current plan remains appropriate     Co-evaluation             End of Session Equipment Utilized During Treatment: Gait belt Activity Tolerance: Patient limited by fatigue;Other (comment);Patient tolerated treatment well (limited by confusion- unable to assess duration of headache.) Patient left: in bed;with call bell/phone within reach;with bed alarm set     Time: [id number] PT Time Calculation (min) (ACUTE ONLY): 37 min  Charges:  $Gait Training: 8-22 mins $Therapeutic Activity: 8-22 mins                    G Codes:      [facility] 11/02/16, 5:17 PM Olena [place], [place] Pager [phone]

## 2016-10-18 NOTE — Progress Notes (Signed)
Pt complain of chest pain when taking her pills, EGK done result sent to the call practitioner, ordered stat troponin test done and result is paged to the on call, pt  Pain lasted for few minutes and has not complain any chest pain since then, I will continue to monitor

## 2016-10-18 NOTE — Progress Notes (Signed)
BP 170/79 hydralazine given as prescribed BP trending down will continue to monitor

## 2016-10-18 NOTE — Progress Notes (Signed)
PROGRESS NOTE    Dawn Woods  [id number] DOB: 1930/02/10 DOA: 10/15/2016 PCP: [name], MD    Brief Narrative:  83 y.o. female with medical history significant of Dementia, hypertrophic obstructive cardiomyopathy, hypothyroidism history of hypertension now resolved, GERD    Presented with worsening confusion since last night, she lives in assisted living and have been unable to recognize familiar faces asking for breakfast in the middle of the night. She fell on Tuesday 16th was found down. Was worrisome if she hit her head or at least face since she had abrasions to her face. No history of being on blood thinners She refused to to to ER. She's been reporting some back pain Her vitals were monitored and have been stable.  She have had xrays of her back no fractures were noted.  Patient have denied any chest pain when questioning she states "I am fine" Family denies any fever, she is prone to UTI's in the past in February patient have had similar admission and was found to have UTI.  Nursing staff was concerned patient was taken to [facility]  Assessment & Plan:   Active Problems:   Hypothyroidism   GERD   Urinary tract infection   Acute encephalopathy   Hypokalemia   Dehydration   Lower urinary tract infectious disease   Dementia without behavioral disturbance   Confusion   Elevated troponin   1. Toxic metabolic encephalopathy 1. suspect related to dehydration vs HTN vs worsening dementia 2. Mental status appears improved 3. Will continue IVF hydration 4. Continue to treat BP per below 5. Doubt UTI given no leuks, no nitrite, no WBC. Many bacteria noted, however more suggestive of bacturia  6. Urine culture is growing >100,000 gm neg rods 7. Had discussed with ID. Agrees to discontinue abx. Likely bacturia, not true UTI 8. This AM, low grade temp of 30F noted. Will repeat UA as pt is historically prone to developing  UTI's 2. Dementia 1. Remains stable at present 2. Patient is answering questions appropriately 3. Continue to monitor 3. Orthostasis 1. Orthostatic vitals from 1/22 reviewed. Note to have 0000000 systolic drop from laying to sitting and 20mm drop from sitting to standing 2. Improved with IVF, however still with 0000000 drop in systolic from laying to sitting 3. Repeat orthostatic vital signs today pending 4. LIkely dehydration 1. Per above, orthostatics positive on admission 2. UA pos for ketones. 3. CBC profile with higher WBC, HGB, and Plts over baseline suggesting concentrated CBC 4. Hygromas noted on head CT, which may be attributed to dehydration 5. Patient is continued on IVF, clinically seems improved 5. Elevated troponin 1. Trending down 2. Doubt ACS. Chest pain is very reproducible on exam 3. Input by cardiology appreciated. Likely trop leak 4. Stable at this time 6. Hypothyroid 1. Continue thyroid replacement as tolerated 2. TSH currently 9.75, down from 16.88 in 2/27 3. Free T4 found to be normal 4. Continue replacement as tolerated 7. HTN 1. BP poorly controlled with SBP into the 200's on presentation 2. Renal function stable 3. OK to resume beta blocker per Cardiology recs. Will resume 4. Have started lisinopril at 10mg  daily  DVT prophylaxis: SCD's Code Status: Full Family Communication: Pt in room Disposition Plan: Home with home health  Consultants:   Cardiology  Discussed case with ID over phone  Procedures:     Antimicrobials: Anti-infectives    Start     Dose/Rate Route Frequency Ordered Stop   10/16/16 1900  cefTRIAXone (  ROCEPHIN) 1 g in dextrose 5 % 50 mL IVPB  Status:  Discontinued     1 g 100 mL/hr over 30 Minutes Intravenous Every 24 hours 10/15/16 2208 10/17/16 1128   10/15/16 1915  cefTRIAXone (ROCEPHIN) 1 g in dextrose 5 % 50 mL IVPB     1 g 100 mL/hr over 30 Minutes Intravenous  Once 10/15/16 1909 10/15/16 2018      Subjective: Without  complaints  Objective: Vitals:   10/17/16 2213 10/17/16 2245 10/18/16 0518 10/18/16 1505  BP: (!) 175/79 140/70 (!) 155/72 (!) 141/84  Pulse: 74  78 80  Resp: 20  20 15   Temp: 97.5 F (36.4 C)  99.2 F (37.3 C) 97.6 F (36.4 C)  TempSrc: Oral   Oral  SpO2: 92%  96% 93%  Weight:      Height:        Intake/Output Summary (Last 24 hours) at 10/18/16 1506 Last data filed at 10/18/16 [id number]  Gross per 24 hour  Intake           2197.5 ml  Output                0 ml  Net           2197.5 ml   Filed Weights   10/15/16 1622  Weight: 67.6 kg (149 lb)    Examination:  General exam: Sitting in chair, conversant, in nad Respiratory system: normal resp effort, no wheezing Cardiovascular system: regular rhythm, s1-2 Gastrointestinal system: no masses, pos BS, nondistended Central nervous system: no seizures, no tremors Extremities: no cyanosis, no joint deformities Skin: normal skin turgor, no notable skin lesions seen Psychiatry: affect normal// no auditory hallucinations   Data Reviewed: I have personally reviewed following labs and imaging studies  CBC:  Recent Labs Lab 10/15/16 1705 10/16/16 0320 10/17/16 0149  WBC 11.5* 14.5* 10.3  NEUTROABS 8.7*  --   --   HGB 15.1* 15.6* 14.5  HCT 46.8* 48.2* 44.2  MCV 92.5 92.2 91.7  PLT 239 241 [id number]   Basic Metabolic Panel:  Recent Labs Lab 10/15/16 1705 10/16/16 0320 10/17/16 0149 10/18/16 0843  NA 139 142 140 141  K 3.1* 3.5 3.2* 4.1  CL 99* 102 99* 108  CO2 32 32 30 25  GLUCOSE 124* 120* 123* 105*  BUN 24* 15 14 17   CREATININE 0.88 0.79 0.75 0.70  CALCIUM 9.0 8.9 8.6* 8.5*  MG  --  1.7  --   --   PHOS  --  2.3*  --   --    GFR: Estimated Creatinine Clearance: 49 mL/min (by C-G formula based on SCr of 0.7 mg/dL). Liver Function Tests:  Recent Labs Lab 10/15/16 1705 10/16/16 0320  AST 23 25  ALT 21 22  ALKPHOS 64 59  BILITOT 1.3* 1.5*  PROT 7.0 6.5  ALBUMIN 3.8 3.6   No results for input(s): LIPASE,  AMYLASE in the last 168 hours. No results for input(s): AMMONIA in the last 168 hours. Coagulation Profile: No results for input(s): INR, PROTIME in the last 168 hours. Cardiac Enzymes:  Recent Labs Lab 10/16/16 1226 10/16/16 1407 10/16/16 1922 10/17/16 0149 10/17/16 2339  TROPONINI 0.06* 0.06* 0.05* 0.05* 0.03*   BNP (last 3 results) No results for input(s): PROBNP in the last 8760 hours. HbA1C:  Recent Labs  10/16/16 0327  HGBA1C 5.6   CBG:  Recent Labs Lab 10/15/16 1644  GLUCAP 107*   Lipid Profile: No results for input(s): CHOL, HDL,  LDLCALC, TRIG, CHOLHDL, LDLDIRECT in the last 72 hours. Thyroid Function Tests:  Recent Labs  10/16/16 0320 10/16/16 1226  TSH 9.715*  --   FREET4  --  1.08   Anemia Panel: No results for input(s): VITAMINB12, FOLATE, FERRITIN, TIBC, IRON, RETICCTPCT in the last 72 hours. Sepsis Labs:  Recent Labs Lab 10/15/16 1727  LATICACIDVEN 1.73    Recent Results (from the past 240 hour(s))  Urine culture     Status: Abnormal (Preliminary result)   Collection Time: 10/15/16  6:25 PM  Result Value Ref Range Status   Specimen Description URINE, CATHETERIZED  Final   Special Requests NONE  Final   Culture (A)  Final    >=100,000 COLONIES/mL KLEBSIELLA PNEUMONIAE CULTURE REINCUBATED FOR BETTER GROWTH Performed at [facility], [place] [address] [place], [place] [address]    Report Status PENDING  Incomplete  MRSA PCR Screening     Status: None   Collection Time: 10/15/16  9:50 PM  Result Value Ref Range Status   MRSA by PCR NEGATIVE NEGATIVE Final    Comment:        The GeneXpert MRSA Assay (FDA approved for NASAL specimens only), is one component of a comprehensive MRSA colonization surveillance program. It is not intended to diagnose MRSA infection nor to guide or monitor treatment for MRSA infections.      Radiology Studies: No results found.  Scheduled Meds: . escitalopram  15 mg Oral Daily  . guaiFENesin   600 mg Oral BID  . levothyroxine  75 mcg Oral QAC breakfast  . lisinopril  10 mg Oral Daily  . simvastatin  10 mg Oral QHS  . sodium chloride flush  3 mL Intravenous Q12H   Continuous Infusions: . sodium chloride 1,000 mL (10/18/16 1356)     LOS: 2 days   CHIU, Orpah Melter, MD [facility] Pager [phone]  If 7PM-7AM, please contact night-coverage [email or web address] Password [facility] 10/18/2016, 3:06 PM

## 2016-10-18 NOTE — Care Management Note (Signed)
Case Management Note  Patient Details  Name: Dawn Woods MRN: [id number] Date of Birth: 02-19-1930  Subjective/Objective:          Admitted to [facility] on 10/15/16 for increased confusion, fall.  Dx with questionable UTI and dehydration with mildly elevated troponins. From [facility]/ALF.     Towanda Octave (Daughter)     [phone]        PCP: Bluford Kaufmann  Action/Plan: Plan is to d/c back to ALF with home health services ( RN,PT,OT) when medically stable. CM to f/u with disposition needs.  Expected Discharge Date:                  Expected Discharge Plan:  Assisted Living / Rest Home ([facility]/ ALF)  In-House Referral:  Clinical Social Work  Discharge planning Services  CM Consult  Post Acute Care Choice:    Choice offered to:     DME Arranged:    DME Agency:     HH Arranged:    [place] Agency:     Status of Service:  In process, will continue to follow  If discussed at Long Length of Stay Meetings, dates discussed:    Additional Comments:  Sharin Mons, RN 10/18/2016, 1:55 PM

## 2016-10-19 LAB — CBC
HEMATOCRIT: 43.7 % (ref 36.0–46.0)
Hemoglobin: 14 g/dL (ref 12.0–15.0)
MCH: 29.6 pg (ref 26.0–34.0)
MCHC: 32 g/dL (ref 30.0–36.0)
MCV: 92.4 fL (ref 78.0–100.0)
Platelets: 210 10*3/uL (ref 150–400)
RBC: 4.73 MIL/uL (ref 3.87–5.11)
RDW: 14.5 % (ref 11.5–15.5)
WBC: 8.9 10*3/uL (ref 4.0–10.5)

## 2016-10-19 LAB — BASIC METABOLIC PANEL
Anion gap: 7 (ref 5–15)
BUN: 12 mg/dL (ref 6–20)
CALCIUM: 8.6 mg/dL — AB (ref 8.9–10.3)
CHLORIDE: 101 mmol/L (ref 101–111)
CO2: 28 mmol/L (ref 22–32)
CREATININE: 0.65 mg/dL (ref 0.44–1.00)
GFR calc Af Amer: >60 mL/min (ref >60)
GFR calc non Af Amer: >60 mL/min (ref >60)
GLUCOSE: 99 mg/dL (ref 65–99)
Potassium: 3.4 mmol/L — ABNORMAL LOW (ref 3.5–5.1)
Sodium: 136 mmol/L (ref 135–145)

## 2016-10-19 MED ORDER — DEXTROSE 5 % IV SOLN
1.0000 g | INTRAVENOUS | Status: DC
  Administered 2016-10-19 – 2016-10-20 (×2): 1 g via INTRAVENOUS
  Filled 2016-10-19 (×3): qty 10

## 2016-10-19 MED ORDER — POTASSIUM CHLORIDE CRYS ER 20 MEQ PO TBCR
40.0000 meq | EXTENDED_RELEASE_TABLET | Freq: Once | ORAL | Status: AC
  Administered 2016-10-19: 40 meq via ORAL
  Filled 2016-10-19: qty 2

## 2016-10-19 MED ORDER — DEXTROSE 5 % IV SOLN
1.0000 g | INTRAVENOUS | Status: DC
  Filled 2016-10-19: qty 10

## 2016-10-19 NOTE — Consult Note (Signed)
           [facility] CM Primary Care Navigator  10/19/2016  Dawn Woods 07-25-1930 [id number]   Patient seen at the bedside with her daughter Dawn Woods) to identify possible discharge needs. Patient was admitted on 10/15/16 for increased confusion, fall, with questionable UTI and dehydration.  Daughter reports that patient currently resides at [facility] (memory unit due to Alzheimer's dementia) since April 2017.  Per daughter's report, all of her care needs have been provided at the facility. Plan is to return to [facility] when ready per daughter.   Discharge plan is to go back to [facility] with home health services when medically stable.  For questions, please contact:  Dannielle Huh, BSN, RN- [facility] Primary Care Navigator  Telephone: [phone] [place]

## 2016-10-19 NOTE — Progress Notes (Signed)
Physical Therapy Treatment Patient Details Name: Dawn Woods MRN: [id number] DOB: 1930-04-04 Today's Date: 10/19/2016    History of Present Illness 83 y.o. female admitted to [facility] on 10/15/16 for increased confusion, fall.  Dx with questionable UTI and dehydration with mildly elevated troponins.  Medical and cardiac workup in progress.  Pt with signifciant PMHx of dementia, HTN, and L wrist fx surgery.     PT Comments    Pt performed gait training with need for supplemental O2 during session.  Informed RN, remain to recommend HHPT at d/c at ALF.    Follow Up Recommendations  Home health PT;Supervision/Assistance - 24 hour (at [facility])     Equipment Recommendations  None recommended by PT    Recommendations for Other Services       Precautions / Restrictions Precautions Precautions: Fall Precaution Comments: recent h/o falls Restrictions Weight Bearing Restrictions: No    Mobility  Bed Mobility Overal bed mobility: Needs Assistance Bed Mobility: Supine to Sit     Supine to sit: Supervision     General bed mobility comments: Cues for hand placement and LE advancement to seated surface.    Transfers Overall transfer level: Needs assistance Equipment used: Rolling walker (2 wheeled) Transfers: Sit to/from Stand Sit to Stand: Min guard         General transfer comment: Cues for sequencing and hand placement to and from seated surface.  Pt performed toilet transfer and required multimodal cueing for correct positioning to toilet.    Ambulation/Gait Ambulation/Gait assistance: Min guard Ambulation Distance (Feet): 240 Feet Assistive device: Rolling walker (2 wheeled) Gait Pattern/deviations: Step-through pattern;Trunk flexed;Narrow base of support     General Gait Details: Pt with mildly staggering gait pattern. Pt able to use RW but required repeated v/c to stay within boundaries of walker.  Displayed labored breathing but patient stated she feels fine.  Pt  desaturated on RA to 87%.  required 2L O2 to maintain sats 97% and above.     Stairs            Wheelchair Mobility    Modified Rankin (Stroke Patients Only)       Balance     Sitting balance-Leahy Scale: Good       Standing balance-Leahy Scale: Fair Standing balance comment: UE support with RW.                      Cognition Arousal/Alertness: Awake/alert Behavior During Therapy: Impulsive Overall Cognitive Status: History of cognitive impairments - at baseline (dementia)                      Exercises      General Comments        Pertinent Vitals/Pain Pain Assessment: Faces Faces Pain Scale: Hurts a little bit Pain Location: head ache Pain Intervention(s): Monitored during session;RN gave pain meds during session    Home Living                      Prior Function            PT Goals (current goals can now be found in the care plan section) Acute Rehab PT Goals Patient Stated Goal: none stated Potential to Achieve Goals: Good Progress towards PT goals: Progressing toward goals    Frequency    Min 3X/week      PT Plan Current plan remains appropriate    Co-evaluation  End of Session Equipment Utilized During Treatment: Gait belt Activity Tolerance: Patient tolerated treatment well (remains limited due to cognitive impairments.  ) Patient left: with call bell/phone within reach;in chair;with chair alarm set;with family/visitor present     Time: 1707-1740 PT Time Calculation (min) (ACUTE ONLY): 33 min  Charges:  $Gait Training: 8-22 mins $Therapeutic Activity: 8-22 mins                    G Codes:      Cristela Blue 10-24-2016, 5:58 PM Governor Rooks, PTA pager [phone]

## 2016-10-19 NOTE — Care Management Important Message (Signed)
Important Message  Patient Details  Name: Dawn Woods MRN: [id number] Date of Birth: 11/11/1929   Medicare Important Message Given:  Yes    Nathen May 10/19/2016, 1:43 PM

## 2016-10-19 NOTE — Progress Notes (Signed)
Lab called about pt's BMET blood sample. They reported that they would restick her for a better sample. Care handed over to the day RN.

## 2016-10-19 NOTE — Evaluation (Deleted)
Physical Therapy Evaluation Patient Details Name: Dawn Woods MRN: [id number] DOB: 03-17-1930 Today's Date: 10/19/2016   History of Present Illness  83 y.o. female admitted to [facility] on 10/15/16 for increased confusion, fall.  Dx with questionable UTI and dehydration with mildly elevated troponins.  Medical and cardiac workup in progress.  Pt with signifciant PMHx of dementia, HTN, and L wrist fx surgery.   Clinical Impression  Pt performed gait training with need for supplemental O2 during session.  Informed RN, remain to recommend HHPT at d/c at ALF.      Follow Up Recommendations Home health PT;Supervision/Assistance - 24 hour (at [facility])    Equipment Recommendations  None recommended by PT    Recommendations for Other Services       Precautions / Restrictions Precautions Precautions: Fall Precaution Comments: recent h/o falls Restrictions Weight Bearing Restrictions: No      Mobility  Bed Mobility Overal bed mobility: Needs Assistance Bed Mobility: Supine to Sit     Supine to sit: Supervision     General bed mobility comments: Cues for hand placement and LE advancement to seated surface.    Transfers Overall transfer level: Needs assistance Equipment used: Rolling walker (2 wheeled) Transfers: Sit to/from Stand Sit to Stand: Min guard         General transfer comment: Cues for sequencing and hand placement to and from seated surface.  Pt performed toilet transfer and required multimodal cueing for correct positioning to toilet.    Ambulation/Gait Ambulation/Gait assistance: Min guard Ambulation Distance (Feet): 240 Feet Assistive device: Rolling walker (2 wheeled) Gait Pattern/deviations: Step-through pattern;Trunk flexed;Narrow base of support     General Gait Details: Pt with mildly staggering gait pattern. Pt able to use RW but required repeated v/c to stay within boundaries of walker.  Displayed labored breathing but patient stated she feels fine.   Pt desaturated on RA to 87%.  required 2L O2 to maintain sats 97% and above.    Stairs            Wheelchair Mobility    Modified Rankin (Stroke Patients Only)       Balance     Sitting balance-Leahy Scale: Good       Standing balance-Leahy Scale: Fair Standing balance comment: UE support with RW.                               Pertinent Vitals/Pain Pain Assessment: Faces Faces Pain Scale: Hurts a little bit Pain Location: head ache Pain Intervention(s): Monitored during session;RN gave pain meds during session    Home Living                        Prior Function                 Hand Dominance        Extremity/Trunk Assessment                Communication      Cognition Arousal/Alertness: Awake/alert Behavior During Therapy: Impulsive Overall Cognitive Status: History of cognitive impairments - at baseline (dementia)                      General Comments      Exercises     Assessment/Plan    PT Assessment    PT Problem List  PT Treatment Interventions      PT Goals (Current goals can be found in the Care Plan section)  Acute Rehab PT Goals Patient Stated Goal: none stated Potential to Achieve Goals: Good    Frequency Min 3X/week   Barriers to discharge        Co-evaluation               End of Session Equipment Utilized During Treatment: Gait belt Activity Tolerance: Patient tolerated treatment well (remains limited due to cognitive impairments.  ) Patient left: with call bell/phone within reach;in chair;with chair alarm set;with family/visitor present Nurse Communication: Mobility status (desaturation on RA. )         [id number] PT Time Calculation (min) (ACUTE ONLY): 33 min   Charges:     PT Treatments $Gait Training: 8-22 mins $Therapeutic Activity: 8-22 mins   PT G Codes:        Cristela Blue 2016-10-21, 5:57 PM Governor Rooks, PTA pager  [phone]

## 2016-10-19 NOTE — Progress Notes (Signed)
Occupational Therapy Treatment Patient Details Name: Dawn Woods MRN: [id number] DOB: 1930/04/22 Today's Date: 10/19/2016    History of present illness 83 y.o. female admitted to [facility] on 10/15/16 for increased confusion, fall.  Dx with questionable UTI and dehydration with mildly elevated troponins.  Medical and cardiac workup in progress.  Pt with signifciant PMHx of dementia, HTN, and L wrist fx surgery.    OT comments  Pt making progress with functional goals. OT will continue to follow acutely  Follow Up Recommendations  Home health OT;Supervision/Assistance - 24 hour    Equipment Recommendations       Recommendations for Other Services      Precautions / Restrictions Precautions Precautions: Fall Precaution Comments: recent h/o falls Restrictions Weight Bearing Restrictions: No       Mobility Bed Mobility Overal bed mobility: Needs Assistance Bed Mobility: Supine to Sit;Sit to Supine     Supine to sit: Modified independent (Device/Increase time);HOB elevated Sit to supine: Supervision      Transfers Overall transfer level: Needs assistance Equipment used: Rolling walker (2 wheeled) Transfers: Sit to/from Stand Sit to Stand: Min guard              Balance     Sitting balance-Leahy Scale: Fair     Standing balance support: During functional activity Standing balance-Leahy Scale: Fair                     ADL Overall ADL's : Needs assistance/impaired     Grooming: Wash/dry hands;Wash/dry face;Standing;Min guard   Upper Body Bathing: Supervision/ safety;Set up;Sitting (simulated)   Lower Body Bathing: Minimal assistance;Sit to/from stand (simulated)   Upper Body Dressing : Supervision/safety;Set up;Sitting       Toilet Transfer: Min guard;RW   Toileting- Clothing Manipulation and Hygiene: Minimal assistance;Sit to/from stand         General ADL Comments: requires mutlimodal cues                                     Cognition   Behavior During Therapy: Impulsive Overall Cognitive Status: No family/caregiver present to determine baseline cognitive functioning Area of Impairment: Orientation;Safety/judgement;Memory;Awareness Orientation Level: Disoriented to;Person;Situation   Memory: Decreased short-term memory    Safety/Judgement: Decreased awareness of safety;Decreased awareness of deficits          Extremity/Trunk Assessment   WFL                        General Comments  pt pleasant and cooperative    Pertinent Vitals/ Pain       Pain Assessment: No/denies pain                                                          Frequency  Min 2X/week        Progress Toward Goals  OT Goals(current goals can now be found in the care plan section)  Progress towards OT goals: Progressing toward goals     Plan Discharge plan remains appropriate                     End of Session Equipment Utilized During Treatment: Gait belt;Rolling walker   Activity Tolerance  Patient tolerated treatment well   Patient Left with chair alarm set;in bed;with bed alarm set;with call bell/phone within reach;with nursing/sitter in room             Time: 1041-1104 OT Time Calculation (min): 23 min  Charges: OT General Charges $OT Visit: 1 Procedure OT Treatments $Self Care/Home Management : 8-22 mins $Therapeutic Activity: 8-22 mins  Britt Bottom 10/19/2016, 1:05 PM

## 2016-10-19 NOTE — Progress Notes (Signed)
PROGRESS NOTE    Dawn Woods  [id number] DOB: [birth year] DOA: 10/15/2016 PCP: [name], MD    Brief Narrative:  83 y.o. female with known dementia, hypertrophic obstructive cardiomyopathy, hypothyroidism, presented to [facility] ED for evaluation of confusion and recent fall just piror to the onset of confusion. Pt also with known recurrent UTI's/   Assessment & Plan:   Toxic metabolic encephalopathy - secondary to UTI, dehydration imposed on dementia - will start Rocephin for now as pt still rather confused - repeat urine cultures requested as the recent once notable for Klebsiella sp.  - also keep on IVF for now until oral intake improves  - will need PT while inpatient   Dementia - difficult to assess progression at this time until encephalopathy resolves - treat UTI for now and monitor clinical response   Orthostatic hypotension  - in the setting of UTI and dehydration  - 0000000 systolic drop from laying to sitting and 37mm drop from sitting to standing, on admission  - improved with IVF - will ask for repeat orthostatics once pt medically more stable   Elevated troponin - not consistent with ACS but likely secondary to acute illness UTI - no chest pain reported this AM   Hypothyroid - TSH currently 9.75, down from 16.88 in 2/27 - Free T4 found to be normal Continue replacement   Hypokalemia - mild, supplement and repeat BMP in AM  HTN, essential  - reasonable control given pt's age, would not try to push her BP to 120's due to orthostasis   DVT prophylaxis: SCD's Code Status: Full Family Communication: Daughter-in-law over the phone  Disposition Plan: To be determined, once UTI treated and pt more medically stable   Consultants:   Cardiology  Procedures:   NOne  Antimicrobials: Rocephin 1/24 -->  Subjective: Without concerns this AM but still confused.   Objective: Vitals:   10/18/16 2347 10/19/16 0434 10/19/16 0603 10/19/16 0704    BP: (!) 175/77 (!) 184/77 (!) 189/74 (!) 145/69  Pulse: 73 65 (!) 58 71  Resp: 20 20    Temp: 98.9 F (37.2 C) 98 F (36.7 C)    TempSrc: Oral Oral    SpO2: 93% 94%  96%  Weight:      Height:        Intake/Output Summary (Last 24 hours) at 10/19/16 1615 Last data filed at 10/19/16 [id number]  Gross per 24 hour  Intake          1446.25 ml  Output             1100 ml  Net           346.25 ml   Filed Weights   10/15/16 1622  Weight: 67.6 kg (149 lb)    Examination:  General exam: Lying down in bed, NAD but confused Respiratory system: normal resp effort, no wheezing Cardiovascular system: regular rhythm, s1-2 Gastrointestinal system: no masses, pos BS, nondistended Central nervous system: no focal deficits, confused but able to follow some simple commands   Data Reviewed: I have personally reviewed following labs and imaging studies  CBC:  Recent Labs Lab 10/15/16 1705 10/16/16 0320 10/17/16 0149 10/19/16 0548  WBC 11.5* 14.5* 10.3 8.9  NEUTROABS 8.7*  --   --   --   HGB 15.1* 15.6* 14.5 14.0  HCT 46.8* 48.2* 44.2 43.7  MCV 92.5 92.2 91.7 92.4  PLT 239 241 191 [id number]   Basic Metabolic Panel:  Recent Labs Lab 10/15/16 1705 10/16/16  0320 10/17/16 0149 10/18/16 0843 10/19/16 0753  NA 139 142 140 141 136  K 3.1* 3.5 3.2* 4.1 3.4*  CL 99* 102 99* 108 101  CO2 32 32 30 25 28   GLUCOSE 124* 120* 123* 105* 99  BUN 24* 15 14 17 12   CREATININE 0.88 0.79 0.75 0.70 0.65  CALCIUM 9.0 8.9 8.6* 8.5* 8.6*  MG  --  1.7  --   --   --   PHOS  --  2.3*  --   --   --    Liver Function Tests:  Recent Labs Lab 10/15/16 1705 10/16/16 0320  AST 23 25  ALT 21 22  ALKPHOS 64 59  BILITOT 1.3* 1.5*  PROT 7.0 6.5  ALBUMIN 3.8 3.6    Recent Labs Lab 10/16/16 1226 10/16/16 1407 10/16/16 1922 10/17/16 0149 10/17/16 2339  TROPONINI 0.06* 0.06* 0.05* 0.05* 0.03*   CBG:  Recent Labs Lab 10/15/16 1644  GLUCAP 107*   Sepsis Labs:  Recent Labs Lab 10/15/16 1727   LATICACIDVEN 1.73    Recent Results (from the past 240 hour(s))  Urine culture     Status: Abnormal (Preliminary result)   Collection Time: 10/15/16  6:25 PM  Result Value Ref Range Status   Specimen Description URINE, CATHETERIZED  Final   Special Requests NONE  Final   Culture (A)  Final    >=100,000 COLONIES/mL KLEBSIELLA PNEUMONIAE REPEATING SUSCEPTIBILITIES Performed at [facility], [place] [address], [place], [place] [address]    Report Status PENDING  Incomplete  MRSA PCR Screening     Status: None   Collection Time: 10/15/16  9:50 PM  Result Value Ref Range Status   MRSA by PCR NEGATIVE NEGATIVE Final    Comment:        The GeneXpert MRSA Assay (FDA approved for NASAL specimens only), is one component of a comprehensive MRSA colonization surveillance program. It is not intended to diagnose MRSA infection nor to guide or monitor treatment for MRSA infections.      Radiology Studies: No results found.  Scheduled Meds: . cefTRIAXone (ROCEPHIN)  IV  1 g Intravenous Q24H  . escitalopram  15 mg Oral Daily  . guaiFENesin  600 mg Oral BID  . levothyroxine  75 mcg Oral QAC breakfast  . lisinopril  10 mg Oral Daily  . metoprolol tartrate  12.5 mg Oral BID  . simvastatin  10 mg Oral QHS  . sodium chloride flush  3 mL Intravenous Q12H   Continuous Infusions: . sodium chloride 75 mL/hr at 10/19/16 0607     LOS: 3 days   Faye Ramsay, MD [facility] Pager [phone]  If 7PM-7AM, please contact night-coverage [email or web address] Password [email or web address] 10/19/2016, 4:15 PM

## 2016-10-20 LAB — BASIC METABOLIC PANEL
Anion gap: 7 (ref 5–15)
BUN: 9 mg/dL (ref 6–20)
CHLORIDE: 103 mmol/L (ref 101–111)
CO2: 30 mmol/L (ref 22–32)
Calcium: 8.7 mg/dL — ABNORMAL LOW (ref 8.9–10.3)
Creatinine, Ser: 0.62 mg/dL (ref 0.44–1.00)
GFR calc Af Amer: >60 mL/min (ref >60)
GFR calc non Af Amer: >60 mL/min (ref >60)
GLUCOSE: 115 mg/dL — AB (ref 65–99)
POTASSIUM: 4.1 mmol/L (ref 3.5–5.1)
Sodium: 140 mmol/L (ref 135–145)

## 2016-10-20 LAB — CBC
HEMATOCRIT: 43.6 % (ref 36.0–46.0)
HEMOGLOBIN: 13.9 g/dL (ref 12.0–15.0)
MCH: 29.8 pg (ref 26.0–34.0)
MCHC: 31.9 g/dL (ref 30.0–36.0)
MCV: 93.6 fL (ref 78.0–100.0)
Platelets: 226 10*3/uL (ref 150–400)
RBC: 4.66 MIL/uL (ref 3.87–5.11)
RDW: 14.7 % (ref 11.5–15.5)
WBC: 8.9 10*3/uL (ref 4.0–10.5)

## 2016-10-20 LAB — URINE CULTURE

## 2016-10-20 NOTE — Progress Notes (Signed)
Iv hydralazine give due to high BP, I have made the first shift nurse nurse  [facility]  aware to recheck BP in 35minutes

## 2016-10-20 NOTE — Progress Notes (Signed)
PROGRESS NOTE    Dawn Woods  [id number] DOB: 17-Apr-1930 DOA: 10/15/2016 PCP: [name], MD    Brief Narrative:  83 y.o. female with known dementia, hypertrophic obstructive cardiomyopathy, hypothyroidism, presented to [facility] ED for evaluation of confusion and recent fall just piror to the onset of confusion. Pt also with known recurrent UTI's/   Assessment & Plan:   Toxic metabolic encephalopathy - secondary to UTI, dehydration imposed on dementia - still confused this AM but overall better  - started Rocephin 1/24, continue day #2 - repeat urine cultures requested 1/24 as the recent once notable for Klebsiella sp.  - change ABX based on urine sensitivity report  - also keep on IVF for now until oral intake improves  - will need PT while inpatient which is ongoing, home health recommended and orders placed   Dementia - difficult to assess progression at this time until encephalopathy resolves - treat UTI for now and monitor clinical response   Orthostatic hypotension  - in the setting of UTI and dehydration  - 20 mm systolic drop from laying to sitting and 38mm drop from sitting to standing, on admission  - improved with IVF - will ask for repeat orthostatics once pt medically more stable   Elevated troponin - not consistent with ACS but likely secondary to acute illness UTI - no chest pain reported this AM   Hypothyroid - TSH currently 9.75, down from 16.88 in 2/27 - Free T4 found to be normal - Continue replacement   Hypokalemia - supplemented and WNL this AM   HTN, essential  - slightly above target range this AM - continue Lisinopril  - added Hydralazine as needed   DVT prophylaxis: SCD's Code Status: Full Family Communication: Daughter-in-law over the phone  Disposition Plan: To be determined, once UTI treated and pt more medically stable   Consultants:   Cardiology  Procedures:   None  Antimicrobials: Rocephin 1/24  -->  Subjective: Without concerns this AM but still confused.   Objective: Vitals:   10/19/16 0704 10/19/16 2103 10/20/16 0609 10/20/16 0910  BP: (!) 145/69 (!) 168/90 (!) 189/81 (!) 159/74  Pulse: 71 75 66 78  Resp:  18    Temp:  97.7 F (36.5 C) 97.2 F (36.2 C)   TempSrc:  Oral Oral   SpO2: 96% 96% 96% 94%  Weight:      Height:        Intake/Output Summary (Last 24 hours) at 10/20/16 0924 Last data filed at 10/20/16 0731  Gross per 24 hour  Intake           1922.5 ml  Output                0 ml  Net           1922.5 ml   Filed Weights   10/15/16 1622  Weight: 67.6 kg (149 lb)    Examination:  General exam: Lying down in bed, NAD but confused Respiratory system: normal resp effort, no wheezing Cardiovascular system: regular rhythm, s1-2 Gastrointestinal system: no masses, pos BS, nondistended Central nervous system: no focal deficits, confused but able to follow some simple commands   Data Reviewed: I have personally reviewed following labs and imaging studies  CBC:  Recent Labs Lab 10/15/16 1705 10/16/16 0320 10/17/16 0149 10/19/16 0548 10/20/16 0627  WBC 11.5* 14.5* 10.3 8.9 8.9  NEUTROABS 8.7*  --   --   --   --   HGB 15.1* 15.6* 14.5  14.0 13.9  HCT 46.8* 48.2* 44.2 43.7 43.6  MCV 92.5 92.2 91.7 92.4 93.6  PLT 239 241 191 210 [id number]   Basic Metabolic Panel:  Recent Labs Lab 10/16/16 0320 10/17/16 0149 10/18/16 0843 10/19/16 0753 10/20/16 0627  NA 142 140 141 136 140  K 3.5 3.2* 4.1 3.4* 4.1  CL 102 99* 108 101 103  CO2 32 30 25 28 30   GLUCOSE 120* 123* 105* 99 115*  BUN 15 14 17 12 9   CREATININE 0.79 0.75 0.70 0.65 0.62  CALCIUM 8.9 8.6* 8.5* 8.6* 8.7*  MG 1.7  --   --   --   --   PHOS 2.3*  --   --   --   --    Liver Function Tests:  Recent Labs Lab 10/15/16 1705 10/16/16 0320  AST 23 25  ALT 21 22  ALKPHOS 64 59  BILITOT 1.3* 1.5*  PROT 7.0 6.5  ALBUMIN 3.8 3.6    Recent Labs Lab 10/16/16 1226 10/16/16 1407  10/16/16 1922 10/17/16 0149 10/17/16 2339  TROPONINI 0.06* 0.06* 0.05* 0.05* 0.03*   CBG:  Recent Labs Lab 10/15/16 1644  GLUCAP 107*   Sepsis Labs:  Recent Labs Lab 10/15/16 1727  LATICACIDVEN 1.73    Recent Results (from the past 240 hour(s))  Urine culture     Status: Abnormal (Preliminary result)   Collection Time: 10/15/16  6:25 PM  Result Value Ref Range Status   Specimen Description URINE, CATHETERIZED  Final   Special Requests NONE  Final   Culture (A)  Final    >=100,000 COLONIES/mL KLEBSIELLA PNEUMONIAE REPEATING SUSCEPTIBILITIES Performed at [facility], [place] [address], [place],  [address]    Report Status PENDING  Incomplete  MRSA PCR Screening     Status: None   Collection Time: 10/15/16  9:50 PM  Result Value Ref Range Status   MRSA by PCR NEGATIVE NEGATIVE Final     Radiology Studies: No results found.  Scheduled Meds: . cefTRIAXone (ROCEPHIN)  IV  1 g Intravenous Q24H  . escitalopram  15 mg Oral Daily  . guaiFENesin  600 mg Oral BID  . levothyroxine  75 mcg Oral QAC breakfast  . lisinopril  10 mg Oral Daily  . metoprolol tartrate  12.5 mg Oral BID  . simvastatin  10 mg Oral QHS  . sodium chloride flush  3 mL Intravenous Q12H   Continuous Infusions: . sodium chloride 75 mL/hr at 10/20/16 0104     LOS: 4 days   Faye Ramsay, MD [facility] Pager [phone]  If 7PM-7AM, please contact night-coverage [email or web address] Password [email or web address] 10/20/2016, 9:24 AM

## 2016-10-21 ENCOUNTER — Inpatient Hospital Stay (HOSPITAL_COMMUNITY): Payer: Medicare Other

## 2016-10-21 DIAGNOSIS — G9340 Encephalopathy, unspecified: Secondary | ICD-10-CM

## 2016-10-21 DIAGNOSIS — E876 Hypokalemia: Secondary | ICD-10-CM

## 2016-10-21 DIAGNOSIS — E034 Atrophy of thyroid (acquired): Secondary | ICD-10-CM

## 2016-10-21 DIAGNOSIS — K219 Gastro-esophageal reflux disease without esophagitis: Secondary | ICD-10-CM

## 2016-10-21 DIAGNOSIS — N390 Urinary tract infection, site not specified: Secondary | ICD-10-CM

## 2016-10-21 DIAGNOSIS — R410 Disorientation, unspecified: Secondary | ICD-10-CM

## 2016-10-21 DIAGNOSIS — E860 Dehydration: Principal | ICD-10-CM

## 2016-10-21 LAB — BASIC METABOLIC PANEL
ANION GAP: 7 (ref 5–15)
BUN: 8 mg/dL (ref 6–20)
CO2: 28 mmol/L (ref 22–32)
Calcium: 8.5 mg/dL — ABNORMAL LOW (ref 8.9–10.3)
Chloride: 102 mmol/L (ref 101–111)
Creatinine, Ser: 0.65 mg/dL (ref 0.44–1.00)
GFR calc Af Amer: >60 mL/min (ref >60)
GFR calc non Af Amer: >60 mL/min (ref >60)
GLUCOSE: 106 mg/dL — AB (ref 65–99)
POTASSIUM: 3.5 mmol/L (ref 3.5–5.1)
Sodium: 137 mmol/L (ref 135–145)

## 2016-10-21 LAB — CBC
HEMATOCRIT: 43.9 % (ref 36.0–46.0)
Hemoglobin: 14.1 g/dL (ref 12.0–15.0)
MCH: 29.7 pg (ref 26.0–34.0)
MCHC: 32.1 g/dL (ref 30.0–36.0)
MCV: 92.6 fL (ref 78.0–100.0)
PLATELETS: 248 10*3/uL (ref 150–400)
RBC: 4.74 MIL/uL (ref 3.87–5.11)
RDW: 14.5 % (ref 11.5–15.5)
WBC: 9.7 10*3/uL (ref 4.0–10.5)

## 2016-10-21 LAB — URINE CULTURE: Culture: >=100000 — AB

## 2016-10-21 MED ORDER — ALBUTEROL SULFATE (2.5 MG/3ML) 0.083% IN NEBU: 5.0000 mg | INHALATION_SOLUTION | Freq: Once | RESPIRATORY_TRACT | Status: DC

## 2016-10-21 MED ORDER — LEVOTHYROXINE SODIUM 100 MCG PO TABS: 100.0000 ug | ORAL_TABLET | Freq: Every day | ORAL | Status: DC

## 2016-10-21 MED ORDER — LISINOPRIL 10 MG PO TABS: 10.0000 mg | ORAL_TABLET | Freq: Every day | ORAL | Status: DC

## 2016-10-21 MED ORDER — CEPHALEXIN 500 MG PO CAPS
500.0000 mg | ORAL_CAPSULE | Freq: Three times a day (TID) | ORAL | Status: DC
  Administered 2016-10-21: 500 mg via ORAL
  Filled 2016-10-21: qty 1

## 2016-10-21 MED ORDER — CEPHALEXIN 500 MG PO CAPS: 500.0000 mg | ORAL_CAPSULE | Freq: Three times a day (TID) | ORAL | 0 refills | Status: AC

## 2016-10-21 MED ORDER — CEPHALEXIN 500 MG PO CAPS: 500.0000 mg | ORAL_CAPSULE | Freq: Three times a day (TID) | ORAL | Status: DC

## 2016-10-21 NOTE — Discharge Summary (Signed)
Physician Discharge Summary  Dawn Woods [id number] DOB: 1929-11-27 DOA: 10/15/2016  PCP: [name], MD  Admit date: 10/15/2016 Discharge date: 10/21/2016  Admitted From: Home Disposition: [facility] dementia unit  Recommendations for Outpatient Follow-up:  1. Follow up with PCP in Nursing facility. 2. Please obtain BMP/CBC in one week, check TSH in 6 weeks. 3. Keflex for 5 more days.  Home Health: NA Equipment/Devices:NA  Discharge Condition: Stable CODE STATUS: Full Code Diet recommendation: Diet Heart Room service appropriate? Yes; Fluid consistency: Thin Diet Heart Room service appropriate? Yes; Fluid consistency: Thin Diet - low sodium heart healthy  Brief/Interim Summary: 83 y.o.femalewith known dementia, hypertrophic obstructive cardiomyopathy, hypothyroidism, presented to [facility] ED for evaluation of confusion and recent fall just piror to the onset of confusion. Pt also with known recurrent UTI's. Had multiple episodes before with confusion at came from UTI.  Discharge Diagnoses:  Active Problems:   Hypothyroidism   GERD   Urinary tract infection   Acute encephalopathy   Hypokalemia   Dehydration   Lower urinary tract infectious disease   Dementia without behavioral disturbance   Confusion   Elevated troponin   Toxic metabolic encephalopathy -As is likely secondary to UTI, dehydration on top of her dementia. -Infusion improving. -Discussed with her daughter the day of discharge, not back 100% to her baseline but it probably will be better for her to go back to her facility or she is familiar with the surroundings. -Patient is alert to herself and place but not to the date.  Klebsiella UTI -Has history of recurrent UTIs that usually manifests only with confusion not fever or leukocytosis. -Urine culture from 1/20 showed Klebsiella, repeat on 1/23 multiple morphotypes -Treated initially with Rocephin, on discharge Keflex for 5 more  days.  Dementia - difficult to assess progression at this time until encephalopathy resolves - treat UTI for now and monitor clinical response, follow closely in the nursing home.  Orthostatic hypotension  - in the setting of UTI and dehydration  - 20 mm systolic drop from laying to sitting and 59mm drop from sitting to standing, on admission  - As is resolved with IV fluids, as a matter of fact blood pressure medication increased, lisinopril added.  Elevated troponin -Slight elevation of troponin (peak 0.08) trend does not support ACS. -Cardiology was consulted and reported this likely from demand ischemia. -Restarted metoprolol.  Hypothyroid - TSH currently 9.75, down from 16.88 in 2/17 - According to the last note from Dr. Sheppard Coil, her dose is 100 g daily restarted.  -There was brief confusion about the dose of her it is a 75 or 100 g. I also recommend to follow TSH in 6 weeks.  Hypokalemia - supplemented and WNL this AM   HTN, essential  - slightly above target range this AM - Added lisinopril to metoprolol.   Discharge Instructions  Discharge Instructions    Diet - low sodium heart healthy    Complete by:  As directed    Increase activity slowly    Complete by:  As directed      Allergies as of 10/21/2016      Reactions   Aspirin Other (See Comments)   GI Bleed   Ibuprofen Other (See Comments)   GI Bleed   Ciprofloxacin Hives      Medication List    TAKE these medications   acetaminophen 325 MG tablet Commonly known as:  TYLENOL Take 650 mg by mouth every 4 (four) hours as needed (for pain). What changed:  Another medication with the same name was removed. Continue taking this medication, and follow the directions you see here.   cephALEXin 500 MG capsule Commonly known as:  KEFLEX Take 1 capsule (500 mg total) by mouth every 8 (eight) hours.   cetirizine 10 MG tablet Commonly known as:  ZYRTEC Take 10 mg by mouth daily.   Cranberry 425 MG  Caps Take 425 mg by mouth 2 (two) times daily.   feeding supplement (ENSURE ENLIVE) Liqd Take 237 mLs by mouth 2 (two) times daily between meals.   ferrous sulfate 325 (65 FE) MG tablet Take 325 mg by mouth every evening.   guaiFENesin-codeine 100-10 MG/5ML syrup Commonly known as:  ROBITUSSIN AC Take 10 mLs by mouth 4 (four) times daily as needed for cough.   levothyroxine 100 MCG tablet Commonly known as:  SYNTHROID, LEVOTHROID Take 1 tablet (100 mcg total) by mouth daily before breakfast. What changed:  medication strength  how much to take  Another medication with the same name was removed. Continue taking this medication, and follow the directions you see here.   LEXAPRO 10 MG tablet Generic drug:  escitalopram Take 15 mg by mouth daily.   lisinopril 10 MG tablet Commonly known as:  PRINIVIL,ZESTRIL Take 1 tablet (10 mg total) by mouth daily. Start taking on:  10/22/2016   meclizine 25 MG tablet Commonly known as:  ANTIVERT Take 25 mg by mouth every 4 (four) hours as needed. Dizziness   metoprolol tartrate 25 MG tablet Commonly known as:  LOPRESSOR Take 0.5 tablets (12.5 mg total) by mouth 2 (two) times daily.   mometasone 50 MCG/ACT nasal spray Commonly known as:  NASONEX Place 2 sprays into the nose daily.   NASACORT AQ 55 MCG/ACT Aero nasal inhaler Generic drug:  triamcinolone Place 1 spray into the nose daily.   pantoprazole 40 MG tablet Commonly known as:  PROTONIX TAKE 1 TABLET TWICE A DAY   PREVIDENT 5000 PLUS DT Place 1 application onto teeth every evening.   simvastatin 10 MG tablet Commonly known as:  ZOCOR Take 1 tablet (10 mg total) by mouth at bedtime.   Vitamin D (Ergocalciferol) 50000 units Caps capsule Commonly known as:  DRISDOL Take 50,000 Units by mouth every Thursday.       Allergies  Allergen Reactions  . Aspirin Other (See Comments)    GI Bleed  . Ibuprofen Other (See Comments)    GI Bleed  . Ciprofloxacin Hives     Consultations: -Cardiology  Procedures (Echo, Carotid, EGD, Colonoscopy, ERCP)   Radiological studies: Dg Chest 2 View  Result Date: 10/15/2016 CLINICAL DATA:  Fall 5 days ago. Right chest pain. Initial encounter. EXAM: CHEST  2 VIEW COMPARISON:  11/26/2015 FINDINGS: The heart size and mediastinal contours are within normal limits. Both lungs are clear. Pulmonary hyperinflation again seen, consistent with COPD. IMPRESSION: Stable exam.  COPD.  No active disease. Electronically Signed   By: Earle Gell M.D.   On: 10/15/2016 17:47   Ct Head Wo Contrast  Result Date: 10/15/2016 CLINICAL DATA:  Fall 5 days ago. Worsening confusion. Altered mental status. EXAM: CT HEAD WITHOUT CONTRAST TECHNIQUE: Contiguous axial images were obtained from the base of the skull through the vertex without intravenous contrast. COMPARISON:  11/23/2015 FINDINGS: Brain: No evidence of acute infarction, hemorrhage, hydrocephalus, extra-axial fluid collections, or mass lesion/mass effect. Mild diffuse cerebral atrophy and severe chronic small vessel disease are again demonstrated. Symmetric increased prominence of extra-axial CSF spaces noted bilaterally, consistent with  small subdural hygromas. No evidence of mass effect. Vascular: No hyperdense vessel or unexpected calcification. Skull: Normal. Negative for fracture or focal lesion. Sinuses/Orbits: No acute finding. Other: None. IMPRESSION: No acute intracranial findings. Small bilateral diffuse subdural hygromas, mildly increased in size since previous study. Diffuse cerebral atrophy and chronic small vessel disease. Electronically Signed   By: Earle Gell M.D.   On: 10/15/2016 17:44   Dg Chest Port 1 View  Result Date: 10/21/2016 CLINICAL DATA:  Cough, shortness of breath, COPD EXAM: PORTABLE CHEST 1 VIEW COMPARISON:  10/15/2016 FINDINGS: Borderline cardiomegaly. Mild perihilar interstitial prominence and mild perihilar increased bronchial markings. Mild edema or  pneumonitis cannot be excluded. There is bilateral small pleural effusion. Streaky right lower lobe atelectasis or infiltrate. Superimposed pneumonia cannot be excluded. Mild left basilar atelectasis. IMPRESSION: Mild perihilar interstitial prominence and mild perihilar increased bronchial markings. Mild edema or pneumonitis cannot be excluded. There is bilateral small pleural effusion. Streaky right lower lobe atelectasis or infiltrate. Superimposed pneumonia cannot be excluded. Mild left basilar atelectasis. Electronically Signed   By: Lahoma Crocker M.D.   On: 10/21/2016 08:41    Subjective:  Discharge Exam: Vitals:   10/21/16 0515 10/21/16 0534 10/21/16 0625 10/21/16 0803  BP: (!) 174/78 (!) 170/69 134/67 (!) 145/78  Pulse: 67  77 75  Resp: 18     Temp: 98.2 F (36.8 C)     TempSrc: Oral     SpO2: 97%  96%   Weight:      Height:       General: Pt is alert, awake, not in acute distress Cardiovascular: RRR, S1/S2 +, no rubs, no gallops Respiratory: CTA bilaterally, no wheezing, no rhonchi Abdominal: Soft, NT, ND, bowel sounds + Extremities: no edema, no cyanosis   The results of significant diagnostics from this hospitalization (including imaging, microbiology, ancillary and laboratory) are listed below for reference.    Microbiology: Recent Results (from the past 240 hour(s))  Urine culture     Status: Abnormal   Collection Time: 10/15/16  6:25 PM  Result Value Ref Range Status   Specimen Description URINE, CATHETERIZED  Final   Special Requests NONE  Final   Culture >=100,000 COLONIES/mL KLEBSIELLA PNEUMONIAE (A)  Final   Report Status 10/21/2016 FINAL  Final   Organism ID, Bacteria KLEBSIELLA PNEUMONIAE (A)  Final      Susceptibility   Klebsiella pneumoniae - MIC*    AMPICILLIN 16 RESISTANT Resistant     CEFAZOLIN <=4 SENSITIVE Sensitive     CEFTRIAXONE <=1 SENSITIVE Sensitive     CIPROFLOXACIN <=0.25 SENSITIVE Sensitive     GENTAMICIN <=1 SENSITIVE Sensitive     IMIPENEM  <=0.25 SENSITIVE Sensitive     NITROFURANTOIN 32 SENSITIVE Sensitive     TRIMETH/SULFA <=20 SENSITIVE Sensitive     AMPICILLIN/SULBACTAM 4 SENSITIVE Sensitive     PIP/TAZO <=4 SENSITIVE Sensitive     Extended ESBL NEGATIVE Sensitive     * >=100,000 COLONIES/mL KLEBSIELLA PNEUMONIAE  MRSA PCR Screening     Status: None   Collection Time: 10/15/16  9:50 PM  Result Value Ref Range Status   MRSA by PCR NEGATIVE NEGATIVE Final    Comment:        The GeneXpert MRSA Assay (FDA approved for NASAL specimens only), is one component of a comprehensive MRSA colonization surveillance program. It is not intended to diagnose MRSA infection nor to guide or monitor treatment for MRSA infections.   Culture, Urine     Status: Abnormal  Collection Time: 10/18/16  4:02 PM  Result Value Ref Range Status   Specimen Description URINE, RANDOM  Final   Special Requests NONE  Final   Culture MULTIPLE SPECIES PRESENT, SUGGEST RECOLLECTION (A)  Final   Report Status 10/20/2016 FINAL  Final     Labs: BNP (last 3 results) No results for input(s): BNP in the last 8760 hours. Basic Metabolic Panel:  Recent Labs Lab 10/16/16 0320 10/17/16 0149 10/18/16 0843 10/19/16 0753 10/20/16 0627 10/21/16 0558  NA 142 140 141 136 140 137  K 3.5 3.2* 4.1 3.4* 4.1 3.5  CL 102 99* 108 101 103 102  CO2 32 30 25 28 30 28   GLUCOSE 120* 123* 105* 99 115* 106*  BUN 15 14 17 12 9 8   CREATININE 0.79 0.75 0.70 0.65 0.62 0.65  CALCIUM 8.9 8.6* 8.5* 8.6* 8.7* 8.5*  MG 1.7  --   --   --   --   --   PHOS 2.3*  --   --   --   --   --    Liver Function Tests:  Recent Labs Lab 10/15/16 1705 10/16/16 0320  AST 23 25  ALT 21 22  ALKPHOS 64 59  BILITOT 1.3* 1.5*  PROT 7.0 6.5  ALBUMIN 3.8 3.6   No results for input(s): LIPASE, AMYLASE in the last 168 hours. No results for input(s): AMMONIA in the last 168 hours. CBC:  Recent Labs Lab 10/15/16 1705 10/16/16 0320 10/17/16 0149 10/19/16 0548 10/20/16 0627  10/21/16 0558  WBC 11.5* 14.5* 10.3 8.9 8.9 9.7  NEUTROABS 8.7*  --   --   --   --   --   HGB 15.1* 15.6* 14.5 14.0 13.9 14.1  HCT 46.8* 48.2* 44.2 43.7 43.6 43.9  MCV 92.5 92.2 91.7 92.4 93.6 92.6  PLT 239 241 191 210 226 248   Cardiac Enzymes:  Recent Labs Lab 10/16/16 1226 10/16/16 1407 10/16/16 1922 10/17/16 0149 10/17/16 2339  TROPONINI 0.06* 0.06* 0.05* 0.05* 0.03*   BNP: Invalid input(s): POCBNP CBG:  Recent Labs Lab 10/15/16 1644  GLUCAP 107*   D-Dimer No results for input(s): DDIMER in the last 72 hours. Hgb A1c No results for input(s): HGBA1C in the last 72 hours. Lipid Profile No results for input(s): CHOL, HDL, LDLCALC, TRIG, CHOLHDL, LDLDIRECT in the last 72 hours. Thyroid function studies No results for input(s): TSH, T4TOTAL, T3FREE, THYROIDAB in the last 72 hours.  Invalid input(s): FREET3 Anemia work up No results for input(s): VITAMINB12, FOLATE, FERRITIN, TIBC, IRON, RETICCTPCT in the last 72 hours. Urinalysis    Component Value Date/Time   COLORURINE YELLOW 10/18/2016 1811   APPEARANCEUR HAZY (A) 10/18/2016 1811   LABSPEC 1.018 10/18/2016 1811   PHURINE 5.0 10/18/2016 1811   GLUCOSEU NEGATIVE 10/18/2016 1811   HGBUR SMALL (A) 10/18/2016 1811   HGBUR trace-intact 06/28/2010 0916   BILIRUBINUR NEGATIVE 10/18/2016 1811   BILIRUBINUR small 12/18/2013 1220   KETONESUR 5 (A) 10/18/2016 1811   PROTEINUR NEGATIVE 10/18/2016 1811   UROBILINOGEN 0.2 12/18/2013 1220   UROBILINOGEN 1.0 06/28/2010 0916   NITRITE NEGATIVE 10/18/2016 1811   LEUKOCYTESUR NEGATIVE 10/18/2016 1811   Sepsis Labs Invalid input(s): PROCALCITONIN,  WBC,  LACTICIDVEN Microbiology Recent Results (from the past 240 hour(s))  Urine culture     Status: Abnormal   Collection Time: 10/15/16  6:25 PM  Result Value Ref Range Status   Specimen Description URINE, CATHETERIZED  Final   Special Requests NONE  Final  Culture >=100,000 COLONIES/mL KLEBSIELLA PNEUMONIAE (A)  Final    Report Status 10/21/2016 FINAL  Final   Organism ID, Bacteria KLEBSIELLA PNEUMONIAE (A)  Final      Susceptibility   Klebsiella pneumoniae - MIC*    AMPICILLIN 16 RESISTANT Resistant     CEFAZOLIN <=4 SENSITIVE Sensitive     CEFTRIAXONE <=1 SENSITIVE Sensitive     CIPROFLOXACIN <=0.25 SENSITIVE Sensitive     GENTAMICIN <=1 SENSITIVE Sensitive     IMIPENEM <=0.25 SENSITIVE Sensitive     NITROFURANTOIN 32 SENSITIVE Sensitive     TRIMETH/SULFA <=20 SENSITIVE Sensitive     AMPICILLIN/SULBACTAM 4 SENSITIVE Sensitive     PIP/TAZO <=4 SENSITIVE Sensitive     Extended ESBL NEGATIVE Sensitive     * >=100,000 COLONIES/mL KLEBSIELLA PNEUMONIAE  MRSA PCR Screening     Status: None   Collection Time: 10/15/16  9:50 PM  Result Value Ref Range Status   MRSA by PCR NEGATIVE NEGATIVE Final    Comment:        The GeneXpert MRSA Assay (FDA approved for NASAL specimens only), is one component of a comprehensive MRSA colonization surveillance program. It is not intended to diagnose MRSA infection nor to guide or monitor treatment for MRSA infections.   Culture, Urine     Status: Abnormal   Collection Time: 10/18/16  4:02 PM  Result Value Ref Range Status   Specimen Description URINE, RANDOM  Final   Special Requests NONE  Final   Culture MULTIPLE SPECIES PRESENT, SUGGEST RECOLLECTION (A)  Final   Report Status 10/20/2016 FINAL  Final     Time coordinating discharge: Over 30 minutes  SIGNED:   Birdie Hopes, MD  [facility] 10/21/2016, 11:24 AM Pager   If 7PM-7AM, please contact night-coverage [email or web address] Password [email or web address]

## 2016-10-21 NOTE — Progress Notes (Signed)
Patient will DC to: [facility] Anticipated DC date: 10/21/16 Family notified: Daughter Transport by: Corey Harold   Per MD patient ready for DC to [facility]. RN, patient, patient's family, and facility notified of DC. Discharge Summary sent to facility. RN given number for report. DC packet on chart. Ambulance transport requested for patient.   CSW signing off.  Cedric Fishman, [place] Social Worker [phone]

## 2016-10-21 NOTE — Progress Notes (Signed)
At 0515 BP was 174/78 automatic, 176/69 manual.  PRN hydralazine give.  A recheck BP is 134/67.  Pt got up to use BSC and was noted to have wheezing by the NT.  RN was notified.  On assessment lungs were clear but wheezing is heard in the upper respiratory tract.  Pt is in not acute distress at this time.  On call NP paged.  Will continue to monitor

## 2016-10-21 NOTE — Care Management Note (Addendum)
Case Management Note  Patient Details  Name: Dawn Woods MRN: [id number] Date of Birth: [birth year]  Subjective/Objective:    Pt with hx of dementia, hypertrophic obstructive cardiomyopathy, hypothyroidism, recurrent UTI's presented to [facility] ED for evaluation of confusion and recent fall, UTI.   Action/Plan:  Plan is to d/c to ALF today with home health services in place. CSW managing disposition back to ALF.  Expected Discharge Date:    10/20/2016              Expected Discharge Plan:  Assisted Living / Rest Home ([facility]/ ALF)  In-House Referral:  Clinical Social Work  Discharge planning Services  CM Consult   [place] Agency:     Status of Service:  Completed, signed off  If discussed at [employer], dates discussed:    Additional Comments:  Sharin Mons, RN 10/21/2016, 10:15 AM

## 2016-10-21 NOTE — Progress Notes (Signed)
PT Cancellation Note  Patient Details Name: Dawn Woods MRN: [id number] DOB: March 12, 1930   Cancelled Treatment:    Reason Eval/Treat Not Completed: Patient declined, no reason specified (Pt addiment about refusal of therapy today, stating she already walk today. Nurse stated pt hasn't walked today and would benefit from it. Despite efforts to inform pt she hasn't walked and the importance of getting up, pt continued to refuse to rest. )   Araceli Bouche 10/21/2016, 12:09 PM [place], [place] Pager [phone]

## 2016-10-21 NOTE — NC FL2 (Signed)
[place] MEDICAID FL2 LEVEL OF CARE SCREENING TOOL     IDENTIFICATION  Patient Name: Dawn Woods Birthdate: June 19, 1930 Sex: female Admission Date (Current Location): 10/15/2016  [facility] and [place] Number:  Herbalist and Address:  [facility] [place]. [facility], [place] [address] [place], [place] [address]      Provider Number: [id number]  Attending Physician Name and Address:  Verlee Monte, MD  Relative Name and Phone Number:  Hilda Blades, daughter, [phone]    Current Level of Care: Hospital Recommended Level of Care: [place] Prior Approval Number:    Date Approved/Denied:   PASRR Number:    Discharge Plan: Other (Comment) (ALF)    Current Diagnoses: Patient Active Problem List   Diagnosis Date Noted  . Confusion 10/15/2016  . Elevated troponin 10/15/2016  . Bronchitis 12/02/2015  . Left leg weakness 12/02/2015  . Dementia without behavioral disturbance 12/02/2015  . Hypertrophic obstructive cardiomyopathy ([place]) 12/02/2015  . Depression 12/02/2015  . Malnutrition of moderate degree 11/26/2015  . Acute respiratory failure with hypoxia ([place]) 11/26/2015  . Acute encephalopathy 11/24/2015  . Hypokalemia 11/24/2015  . Dehydration 11/24/2015  . Generalized weakness 11/24/2015  . Lower urinary tract infectious disease   . BPV (benign positional vertigo) 08/25/2014  . Melena 08/05/2013  . Personal history of colonic polyps 08/05/2013  . Tobacco abuse 01/04/2012  . Urinary tract infection 05/19/2010  . Hypothyroidism 04/21/2008  . Dyslipidemia 04/21/2008  . GERD 04/21/2008    Orientation RESPIRATION BLADDER Height & Weight     Self, Place  Normal Continent Weight: 67.6 kg (149 lb) Height:  5\' 6"  (167.6 cm)  BEHAVIORAL SYMPTOMS/MOOD NEUROLOGICAL BOWEL NUTRITION STATUS      Continent  (Regular)  AMBULATORY STATUS COMMUNICATION OF NEEDS Skin   Limited Assist Verbally Normal                       Personal Care  Assistance Level of Assistance  Bathing, Feeding, Dressing Bathing Assistance: Limited assistance Feeding assistance: Independent Dressing Assistance: Limited assistance     Functional Limitations Info             SPECIAL CARE FACTORS FREQUENCY  PT (By licensed PT)     PT Frequency: home health PT              Contractures      Additional Factors Info  Code Status, Allergies Code Status Info: Full Allergies Info:  Aspirin, Ibuprofen, Ciprofloxacin           Current Medications (10/21/2016):   Discharge Medications: TAKE these medications   acetaminophen 325 MG tablet Commonly known as:  TYLENOL Take 650 mg by mouth every 4 (four) hours as needed (for pain). What changed:  Another medication with the same name was removed. Continue taking this medication, and follow the directions you see here.   cephALEXin 500 MG capsule Commonly known as:  KEFLEX Take 1 capsule (500 mg total) by mouth every 8 (eight) hours.   cetirizine 10 MG tablet Commonly known as:  ZYRTEC Take 10 mg by mouth daily.   Cranberry 425 MG Caps Take 425 mg by mouth 2 (two) times daily.   feeding supplement (ENSURE ENLIVE) Liqd Take 237 mLs by mouth 2 (two) times daily between meals.   ferrous sulfate 325 (65 FE) MG tablet Take 325 mg by mouth every evening.   guaiFENesin-codeine 100-10 MG/5ML syrup Commonly known as:  ROBITUSSIN AC Take 10 mLs by mouth 4 (four)  times daily as needed for cough.   levothyroxine 100 MCG tablet Commonly known as:  SYNTHROID, LEVOTHROID Take 1 tablet (100 mcg total) by mouth daily before breakfast. What changed:  medication strength  how much to take  Another medication with the same name was removed. Continue taking this medication, and follow the directions you see here.   LEXAPRO 10 MG tablet Generic drug:  escitalopram Take 15 mg by mouth daily.   lisinopril 10 MG tablet Commonly known as:  PRINIVIL,ZESTRIL Take 1 tablet (10 mg  total) by mouth daily. Start taking on:  10/22/2016   meclizine 25 MG tablet Commonly known as:  ANTIVERT Take 25 mg by mouth every 4 (four) hours as needed. Dizziness   metoprolol tartrate 25 MG tablet Commonly known as:  LOPRESSOR Take 0.5 tablets (12.5 mg total) by mouth 2 (two) times daily.   mometasone 50 MCG/ACT nasal spray Commonly known as:  NASONEX Place 2 sprays into the nose daily.   NASACORT AQ 55 MCG/ACT Aero nasal inhaler Generic drug:  triamcinolone Place 1 spray into the nose daily.   pantoprazole 40 MG tablet Commonly known as:  PROTONIX TAKE 1 TABLET TWICE A DAY   PREVIDENT 5000 PLUS DT Place 1 application onto teeth every evening.   simvastatin 10 MG tablet Commonly known as:  ZOCOR Take 1 tablet (10 mg total) by mouth at bedtime.   Vitamin D (Ergocalciferol) 50000 units Caps capsule Commonly known as:  DRISDOL Take 50,000 Units by mouth every Thursday.    Relevant Imaging Results:  Relevant Lab Results:   Additional Information SSN: [id number] Will need home health PT, OT, [place] [facility] [place], LCSWA

## 2016-10-21 NOTE — Progress Notes (Signed)
[name] to be D/C'd Nursing Home per MD order.  Discussed with the patient and all questions fully answered. Report called to [facility] at facility   VSS, Skin clean, dry and intact without evidence of skin break down, no evidence of skin tears noted. IV catheter discontinued intact. Site without signs and symptoms of complications. Dressing and pressure applied.  An After Visit Summary was printed and given to the patient. Patient received prescription.  D/c education completed with patient/family including follow up instructions, medication list, d/c activities limitations if indicated, with other d/c instructions as indicated by MD - patient able to verbalize understanding, all questions fully answered.   Patient instructed to return to ED, call 911, or call MD for any changes in condition.   Patient escorted via [place], and D/C home via private auto.  Dawn Woods 10/21/2016 1:35 PM

## 2016-10-24 DIAGNOSIS — D509 Iron deficiency anemia, unspecified: Secondary | ICD-10-CM | POA: Diagnosis not present

## 2016-10-24 DIAGNOSIS — N390 Urinary tract infection, site not specified: Secondary | ICD-10-CM | POA: Diagnosis not present

## 2016-10-24 DIAGNOSIS — F0390 Unspecified dementia without behavioral disturbance: Secondary | ICD-10-CM | POA: Diagnosis not present

## 2016-10-24 DIAGNOSIS — I10 Essential (primary) hypertension: Secondary | ICD-10-CM | POA: Diagnosis not present

## 2016-10-24 DIAGNOSIS — E785 Hyperlipidemia, unspecified: Secondary | ICD-10-CM | POA: Diagnosis not present

## 2016-10-27 DIAGNOSIS — M6281 Muscle weakness (generalized): Secondary | ICD-10-CM | POA: Diagnosis not present

## 2016-10-27 DIAGNOSIS — G9341 Metabolic encephalopathy: Secondary | ICD-10-CM | POA: Diagnosis not present

## 2016-10-27 DIAGNOSIS — E876 Hypokalemia: Secondary | ICD-10-CM | POA: Diagnosis not present

## 2016-10-27 DIAGNOSIS — N390 Urinary tract infection, site not specified: Secondary | ICD-10-CM | POA: Diagnosis not present

## 2016-10-27 DIAGNOSIS — R2689 Other abnormalities of gait and mobility: Secondary | ICD-10-CM | POA: Diagnosis not present

## 2016-10-29 DIAGNOSIS — I10 Essential (primary) hypertension: Secondary | ICD-10-CM | POA: Diagnosis not present

## 2016-10-29 DIAGNOSIS — D649 Anemia, unspecified: Secondary | ICD-10-CM | POA: Diagnosis not present

## 2016-10-29 DIAGNOSIS — R5383 Other fatigue: Secondary | ICD-10-CM | POA: Diagnosis not present

## 2016-11-01 DIAGNOSIS — E876 Hypokalemia: Secondary | ICD-10-CM | POA: Diagnosis not present

## 2016-11-01 DIAGNOSIS — N390 Urinary tract infection, site not specified: Secondary | ICD-10-CM | POA: Diagnosis not present

## 2016-11-01 DIAGNOSIS — M6281 Muscle weakness (generalized): Secondary | ICD-10-CM | POA: Diagnosis not present

## 2016-11-01 DIAGNOSIS — R2689 Other abnormalities of gait and mobility: Secondary | ICD-10-CM | POA: Diagnosis not present

## 2016-11-01 DIAGNOSIS — G9341 Metabolic encephalopathy: Secondary | ICD-10-CM | POA: Diagnosis not present

## 2016-11-02 DIAGNOSIS — R2689 Other abnormalities of gait and mobility: Secondary | ICD-10-CM | POA: Diagnosis not present

## 2016-11-02 DIAGNOSIS — M6281 Muscle weakness (generalized): Secondary | ICD-10-CM | POA: Diagnosis not present

## 2016-11-02 DIAGNOSIS — E876 Hypokalemia: Secondary | ICD-10-CM | POA: Diagnosis not present

## 2016-11-02 DIAGNOSIS — N390 Urinary tract infection, site not specified: Secondary | ICD-10-CM | POA: Diagnosis not present

## 2016-11-02 DIAGNOSIS — G9341 Metabolic encephalopathy: Secondary | ICD-10-CM | POA: Diagnosis not present

## 2016-11-04 DIAGNOSIS — R2689 Other abnormalities of gait and mobility: Secondary | ICD-10-CM | POA: Diagnosis not present

## 2016-11-04 DIAGNOSIS — G9341 Metabolic encephalopathy: Secondary | ICD-10-CM | POA: Diagnosis not present

## 2016-11-04 DIAGNOSIS — M6281 Muscle weakness (generalized): Secondary | ICD-10-CM | POA: Diagnosis not present

## 2016-11-04 DIAGNOSIS — N390 Urinary tract infection, site not specified: Secondary | ICD-10-CM | POA: Diagnosis not present

## 2016-11-04 DIAGNOSIS — E876 Hypokalemia: Secondary | ICD-10-CM | POA: Diagnosis not present

## 2016-11-08 DIAGNOSIS — R2689 Other abnormalities of gait and mobility: Secondary | ICD-10-CM | POA: Diagnosis not present

## 2016-11-08 DIAGNOSIS — E876 Hypokalemia: Secondary | ICD-10-CM | POA: Diagnosis not present

## 2016-11-08 DIAGNOSIS — M6281 Muscle weakness (generalized): Secondary | ICD-10-CM | POA: Diagnosis not present

## 2016-11-08 DIAGNOSIS — N390 Urinary tract infection, site not specified: Secondary | ICD-10-CM | POA: Diagnosis not present

## 2016-11-08 DIAGNOSIS — G9341 Metabolic encephalopathy: Secondary | ICD-10-CM | POA: Diagnosis not present

## 2016-11-09 DIAGNOSIS — M6281 Muscle weakness (generalized): Secondary | ICD-10-CM | POA: Diagnosis not present

## 2016-11-09 DIAGNOSIS — N390 Urinary tract infection, site not specified: Secondary | ICD-10-CM | POA: Diagnosis not present

## 2016-11-09 DIAGNOSIS — E876 Hypokalemia: Secondary | ICD-10-CM | POA: Diagnosis not present

## 2016-11-09 DIAGNOSIS — R2689 Other abnormalities of gait and mobility: Secondary | ICD-10-CM | POA: Diagnosis not present

## 2016-11-09 DIAGNOSIS — G9341 Metabolic encephalopathy: Secondary | ICD-10-CM | POA: Diagnosis not present

## 2016-11-11 DIAGNOSIS — E876 Hypokalemia: Secondary | ICD-10-CM | POA: Diagnosis not present

## 2016-11-11 DIAGNOSIS — N390 Urinary tract infection, site not specified: Secondary | ICD-10-CM | POA: Diagnosis not present

## 2016-11-11 DIAGNOSIS — G9341 Metabolic encephalopathy: Secondary | ICD-10-CM | POA: Diagnosis not present

## 2016-11-11 DIAGNOSIS — M6281 Muscle weakness (generalized): Secondary | ICD-10-CM | POA: Diagnosis not present

## 2016-11-11 DIAGNOSIS — R2689 Other abnormalities of gait and mobility: Secondary | ICD-10-CM | POA: Diagnosis not present

## 2016-11-15 DIAGNOSIS — G9341 Metabolic encephalopathy: Secondary | ICD-10-CM | POA: Diagnosis not present

## 2016-11-15 DIAGNOSIS — E876 Hypokalemia: Secondary | ICD-10-CM | POA: Diagnosis not present

## 2016-11-15 DIAGNOSIS — N390 Urinary tract infection, site not specified: Secondary | ICD-10-CM | POA: Diagnosis not present

## 2016-11-15 DIAGNOSIS — R2689 Other abnormalities of gait and mobility: Secondary | ICD-10-CM | POA: Diagnosis not present

## 2016-11-15 DIAGNOSIS — M6281 Muscle weakness (generalized): Secondary | ICD-10-CM | POA: Diagnosis not present

## 2016-11-16 DIAGNOSIS — R2689 Other abnormalities of gait and mobility: Secondary | ICD-10-CM | POA: Diagnosis not present

## 2016-11-16 DIAGNOSIS — E876 Hypokalemia: Secondary | ICD-10-CM | POA: Diagnosis not present

## 2016-11-16 DIAGNOSIS — M6281 Muscle weakness (generalized): Secondary | ICD-10-CM | POA: Diagnosis not present

## 2016-11-16 DIAGNOSIS — N390 Urinary tract infection, site not specified: Secondary | ICD-10-CM | POA: Diagnosis not present

## 2016-11-16 DIAGNOSIS — G9341 Metabolic encephalopathy: Secondary | ICD-10-CM | POA: Diagnosis not present

## 2016-11-17 DIAGNOSIS — E876 Hypokalemia: Secondary | ICD-10-CM | POA: Diagnosis not present

## 2016-11-17 DIAGNOSIS — R2689 Other abnormalities of gait and mobility: Secondary | ICD-10-CM | POA: Diagnosis not present

## 2016-11-17 DIAGNOSIS — G9341 Metabolic encephalopathy: Secondary | ICD-10-CM | POA: Diagnosis not present

## 2016-11-17 DIAGNOSIS — F329 Major depressive disorder, single episode, unspecified: Secondary | ICD-10-CM | POA: Diagnosis not present

## 2016-11-17 DIAGNOSIS — N390 Urinary tract infection, site not specified: Secondary | ICD-10-CM | POA: Diagnosis not present

## 2016-11-17 DIAGNOSIS — M6281 Muscle weakness (generalized): Secondary | ICD-10-CM | POA: Diagnosis not present

## 2016-11-21 DIAGNOSIS — N390 Urinary tract infection, site not specified: Secondary | ICD-10-CM | POA: Diagnosis not present

## 2016-11-21 DIAGNOSIS — G9341 Metabolic encephalopathy: Secondary | ICD-10-CM | POA: Diagnosis not present

## 2016-11-21 DIAGNOSIS — M6281 Muscle weakness (generalized): Secondary | ICD-10-CM | POA: Diagnosis not present

## 2016-11-21 DIAGNOSIS — R2689 Other abnormalities of gait and mobility: Secondary | ICD-10-CM | POA: Diagnosis not present

## 2016-11-21 DIAGNOSIS — E876 Hypokalemia: Secondary | ICD-10-CM | POA: Diagnosis not present

## 2016-11-30 DIAGNOSIS — R531 Weakness: Secondary | ICD-10-CM | POA: Diagnosis not present

## 2016-11-30 DIAGNOSIS — E559 Vitamin D deficiency, unspecified: Secondary | ICD-10-CM | POA: Diagnosis not present

## 2016-11-30 DIAGNOSIS — E039 Hypothyroidism, unspecified: Secondary | ICD-10-CM | POA: Diagnosis not present

## 2016-12-07 DIAGNOSIS — E860 Dehydration: Secondary | ICD-10-CM | POA: Diagnosis not present

## 2016-12-07 DIAGNOSIS — E875 Hyperkalemia: Secondary | ICD-10-CM | POA: Diagnosis not present

## 2017-01-18 DIAGNOSIS — J309 Allergic rhinitis, unspecified: Secondary | ICD-10-CM | POA: Diagnosis not present

## 2017-01-18 DIAGNOSIS — E039 Hypothyroidism, unspecified: Secondary | ICD-10-CM | POA: Diagnosis not present

## 2017-01-18 DIAGNOSIS — K219 Gastro-esophageal reflux disease without esophagitis: Secondary | ICD-10-CM | POA: Diagnosis not present

## 2017-01-18 DIAGNOSIS — I10 Essential (primary) hypertension: Secondary | ICD-10-CM | POA: Diagnosis not present

## 2017-01-18 DIAGNOSIS — F0390 Unspecified dementia without behavioral disturbance: Secondary | ICD-10-CM | POA: Diagnosis not present

## 2017-01-31 DIAGNOSIS — H26493 Other secondary cataract, bilateral: Secondary | ICD-10-CM | POA: Diagnosis not present

## 2017-01-31 DIAGNOSIS — Z7951 Long term (current) use of inhaled steroids: Secondary | ICD-10-CM | POA: Diagnosis not present

## 2017-01-31 DIAGNOSIS — Z961 Presence of intraocular lens: Secondary | ICD-10-CM | POA: Diagnosis not present

## 2017-01-31 DIAGNOSIS — H04123 Dry eye syndrome of bilateral lacrimal glands: Secondary | ICD-10-CM | POA: Diagnosis not present

## 2017-03-15 DIAGNOSIS — F329 Major depressive disorder, single episode, unspecified: Secondary | ICD-10-CM | POA: Diagnosis not present

## 2017-04-27 DIAGNOSIS — E785 Hyperlipidemia, unspecified: Secondary | ICD-10-CM | POA: Diagnosis not present

## 2017-04-27 DIAGNOSIS — E039 Hypothyroidism, unspecified: Secondary | ICD-10-CM | POA: Diagnosis not present

## 2017-04-27 DIAGNOSIS — I10 Essential (primary) hypertension: Secondary | ICD-10-CM | POA: Diagnosis not present

## 2017-04-27 DIAGNOSIS — K219 Gastro-esophageal reflux disease without esophagitis: Secondary | ICD-10-CM | POA: Diagnosis not present

## 2017-05-31 DIAGNOSIS — F329 Major depressive disorder, single episode, unspecified: Secondary | ICD-10-CM | POA: Diagnosis not present

## 2017-06-15 ENCOUNTER — Encounter: Payer: Self-pay | Admitting: Internal Medicine

## 2017-07-03 DIAGNOSIS — I10 Essential (primary) hypertension: Secondary | ICD-10-CM | POA: Diagnosis not present

## 2017-07-26 DIAGNOSIS — F329 Major depressive disorder, single episode, unspecified: Secondary | ICD-10-CM | POA: Diagnosis not present

## 2017-07-26 DIAGNOSIS — F39 Unspecified mood [affective] disorder: Secondary | ICD-10-CM | POA: Diagnosis not present

## 2017-08-09 DIAGNOSIS — R4189 Other symptoms and signs involving cognitive functions and awareness: Secondary | ICD-10-CM | POA: Diagnosis not present

## 2017-08-09 DIAGNOSIS — F39 Unspecified mood [affective] disorder: Secondary | ICD-10-CM | POA: Diagnosis not present

## 2017-08-09 DIAGNOSIS — F329 Major depressive disorder, single episode, unspecified: Secondary | ICD-10-CM | POA: Diagnosis not present

## 2017-09-06 DIAGNOSIS — F419 Anxiety disorder, unspecified: Secondary | ICD-10-CM | POA: Diagnosis not present

## 2017-09-06 DIAGNOSIS — F329 Major depressive disorder, single episode, unspecified: Secondary | ICD-10-CM | POA: Diagnosis not present

## 2017-09-06 DIAGNOSIS — E039 Hypothyroidism, unspecified: Secondary | ICD-10-CM | POA: Diagnosis not present

## 2017-09-06 DIAGNOSIS — R4189 Other symptoms and signs involving cognitive functions and awareness: Secondary | ICD-10-CM | POA: Diagnosis not present

## 2017-09-14 DIAGNOSIS — R05 Cough: Secondary | ICD-10-CM | POA: Diagnosis not present

## 2018-03-15 IMAGING — CT CT HEAD W/O CM
3 series · 15 of 47 positions shown, 18 images · non-contrast
Comparison: 11/23/2015

CLINICAL DATA: Fall 5 days ago. Worsening confusion. Altered mental
status.

EXAM:
CT HEAD WITHOUT CONTRAST
TECHNIQUE: Contiguous axial images were obtained from the base of the skull
through the vertex without intravenous contrast.

[Series 2: head wo · axial · 0.42mm/px · z∈[-98,+26]mm · 9 of 31 slices shown, 12 images]
[im 3/31  brain]
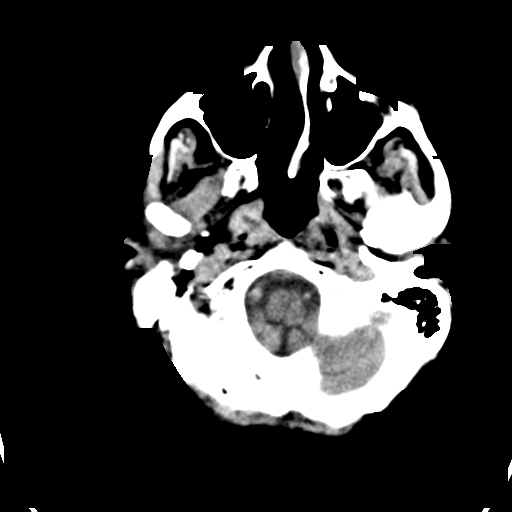
[im 3/31  bone]
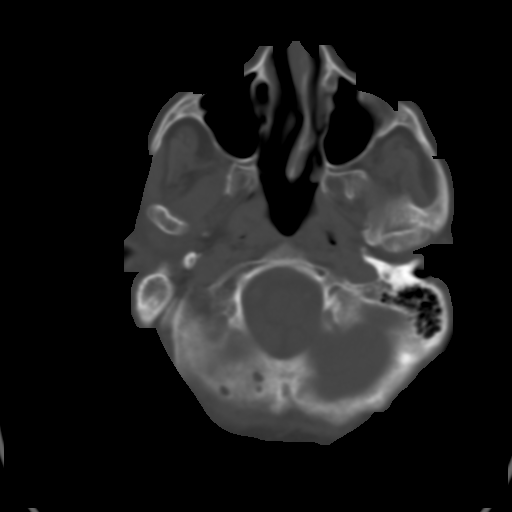
[im 6/31  brain]
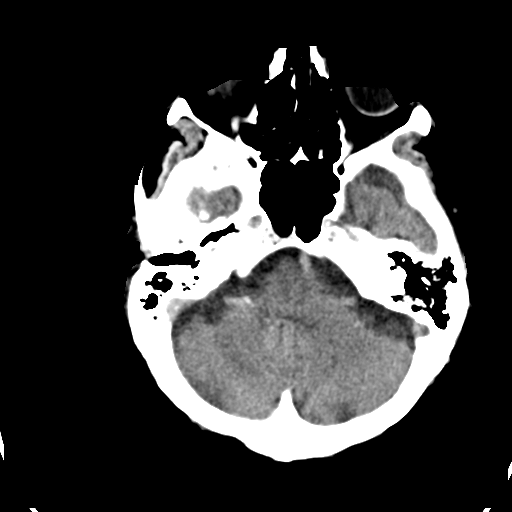
[im 9/31  brain]
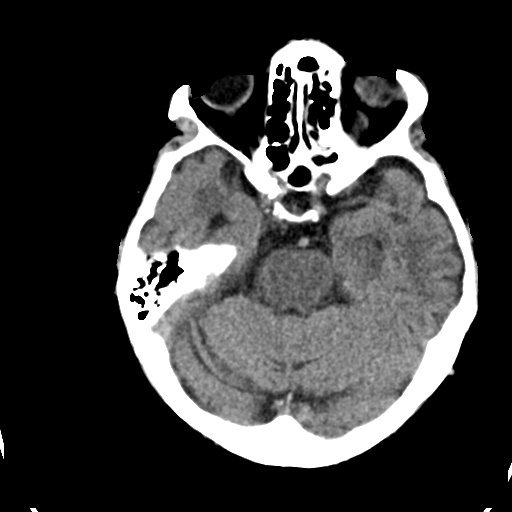
[im 12/31  brain]
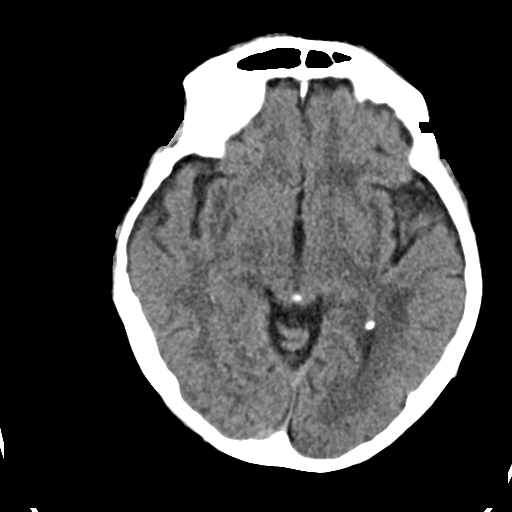
[im 16/31  brain]
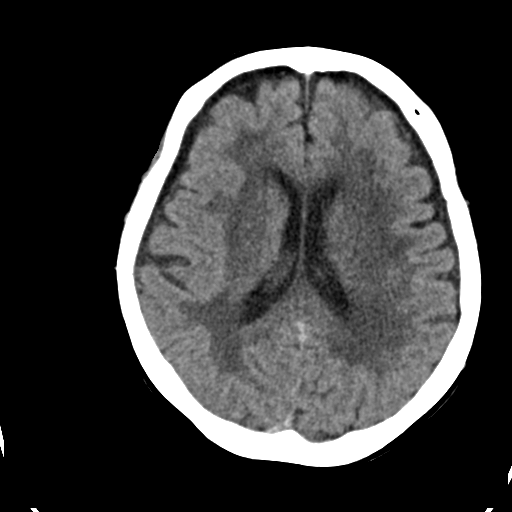
[im 16/31  bone]
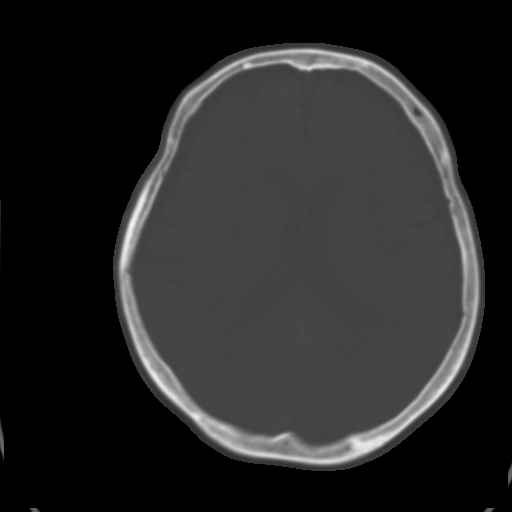
[im 19/31  brain]
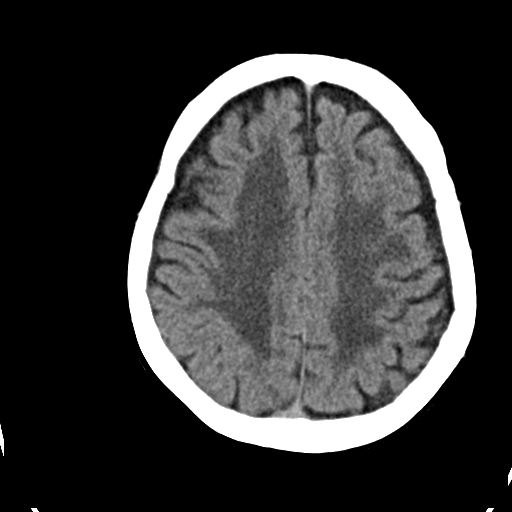
[im 22/31  brain]
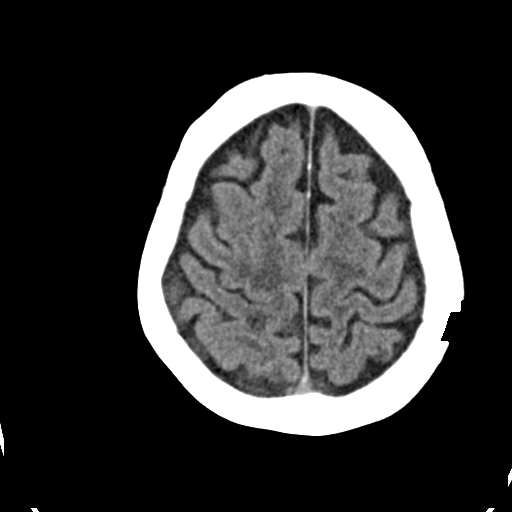
[im 25/31  brain]
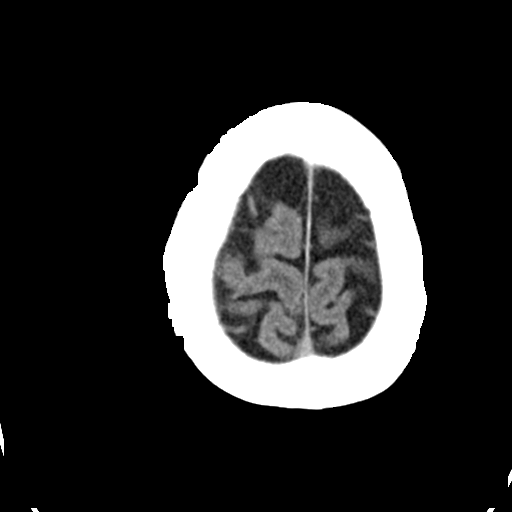
[im 28/31  brain]
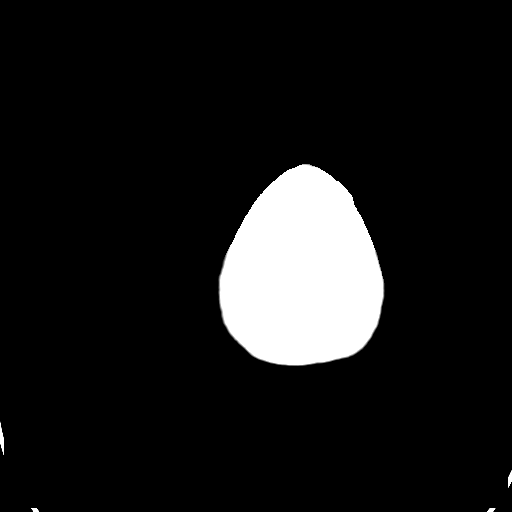
[im 28/31  bone]
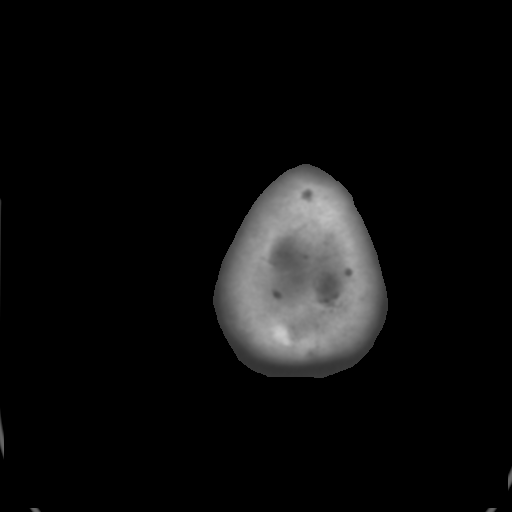

[Series 4: coronal soft · coronal · 0.29mm/px · 3 of 64 slices shown]
[im 22/64  brain]
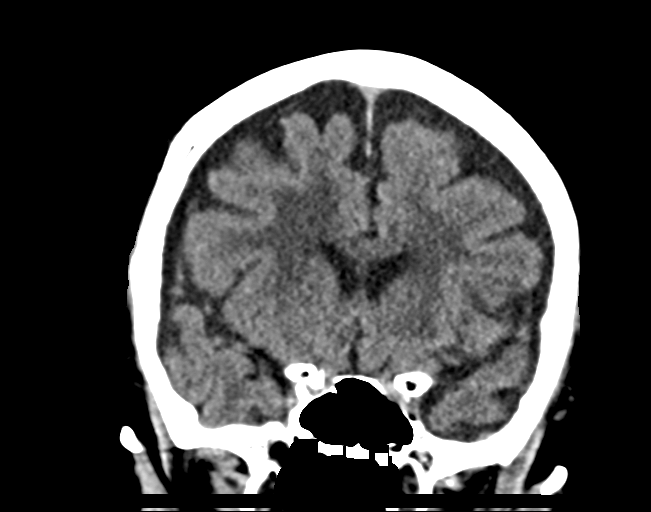
[im 29/64  brain]
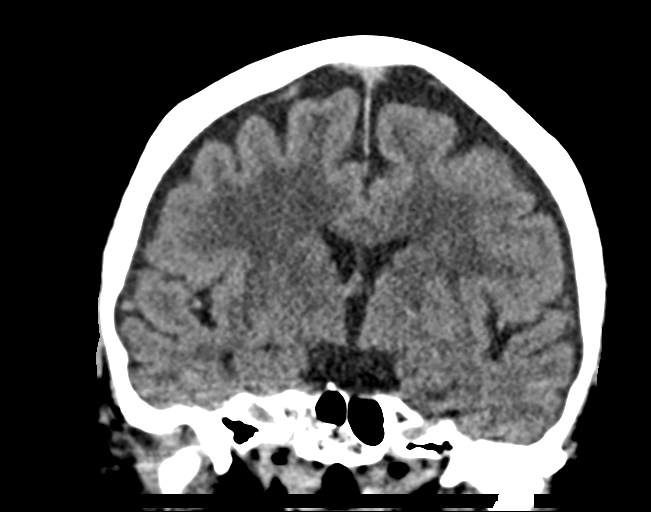
[im 36/64  brain]
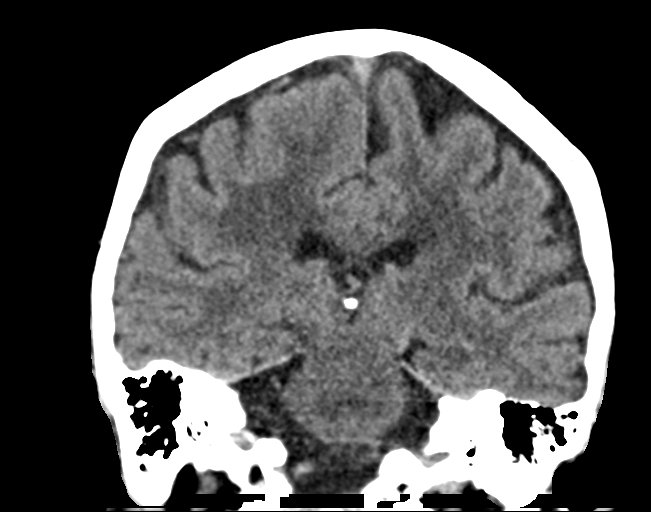

[Series 5: sag soft · sagittal · 0.29mm/px · 3 of 57 slices shown]
[im 19/57  brain]
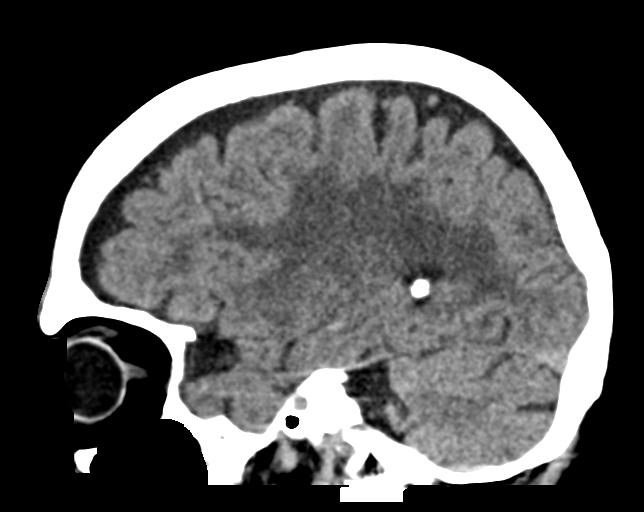
[im 29/57  brain]
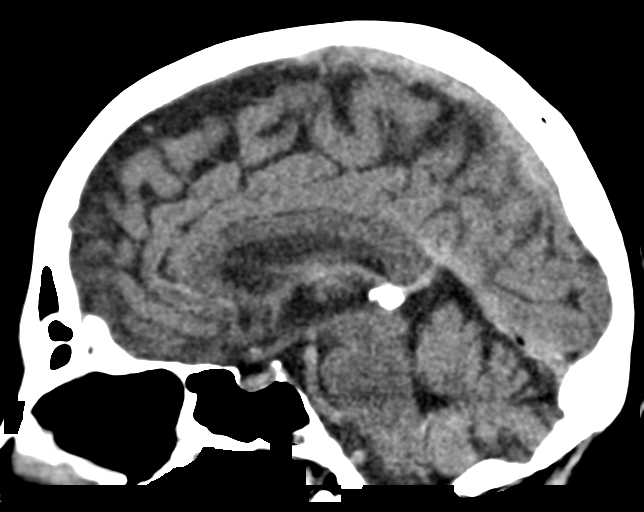
[im 38/57  brain]
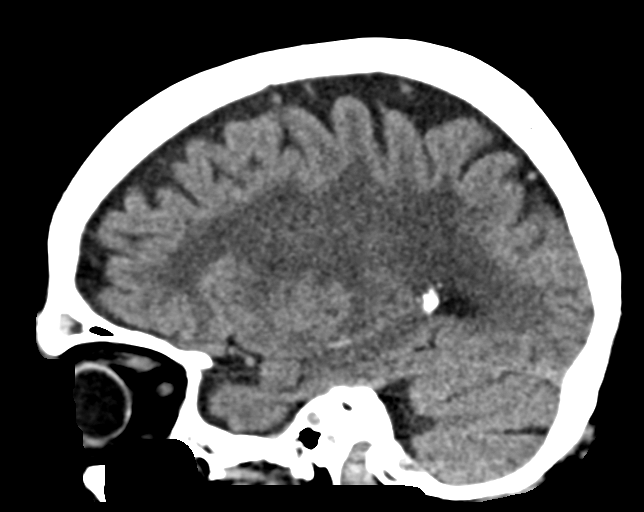

[15 of 47 positions shown; findings below may reference images not displayed]

FINDINGS: Brain: No evidence of acute infarction, hemorrhage, hydrocephalus,
extra-axial fluid collections, or mass lesion/mass effect. Mild
diffuse cerebral atrophy and severe chronic small vessel disease are
again demonstrated. Symmetric increased prominence of extra-axial
CSF spaces noted bilaterally, consistent with small subdural
hygromas. No evidence of mass effect.

Vascular: No hyperdense vessel or unexpected calcification.

Skull: Normal. Negative for fracture or focal lesion.

Sinuses/Orbits: No acute finding.

Other: None.
IMPRESSION: No acute intracranial findings.

Small bilateral diffuse subdural hygromas, mildly increased in size
since previous study.

Diffuse cerebral atrophy and chronic small vessel disease.

## 2018-03-15 IMAGING — CR DG CHEST 2V
2 series · 2 of 2 positions shown · non-contrast
Comparison: 11/26/2015

CLINICAL DATA: Fall 5 days ago. Right chest pain. Initial
encounter.

EXAM:
CHEST  2 VIEW

[w chest pa]
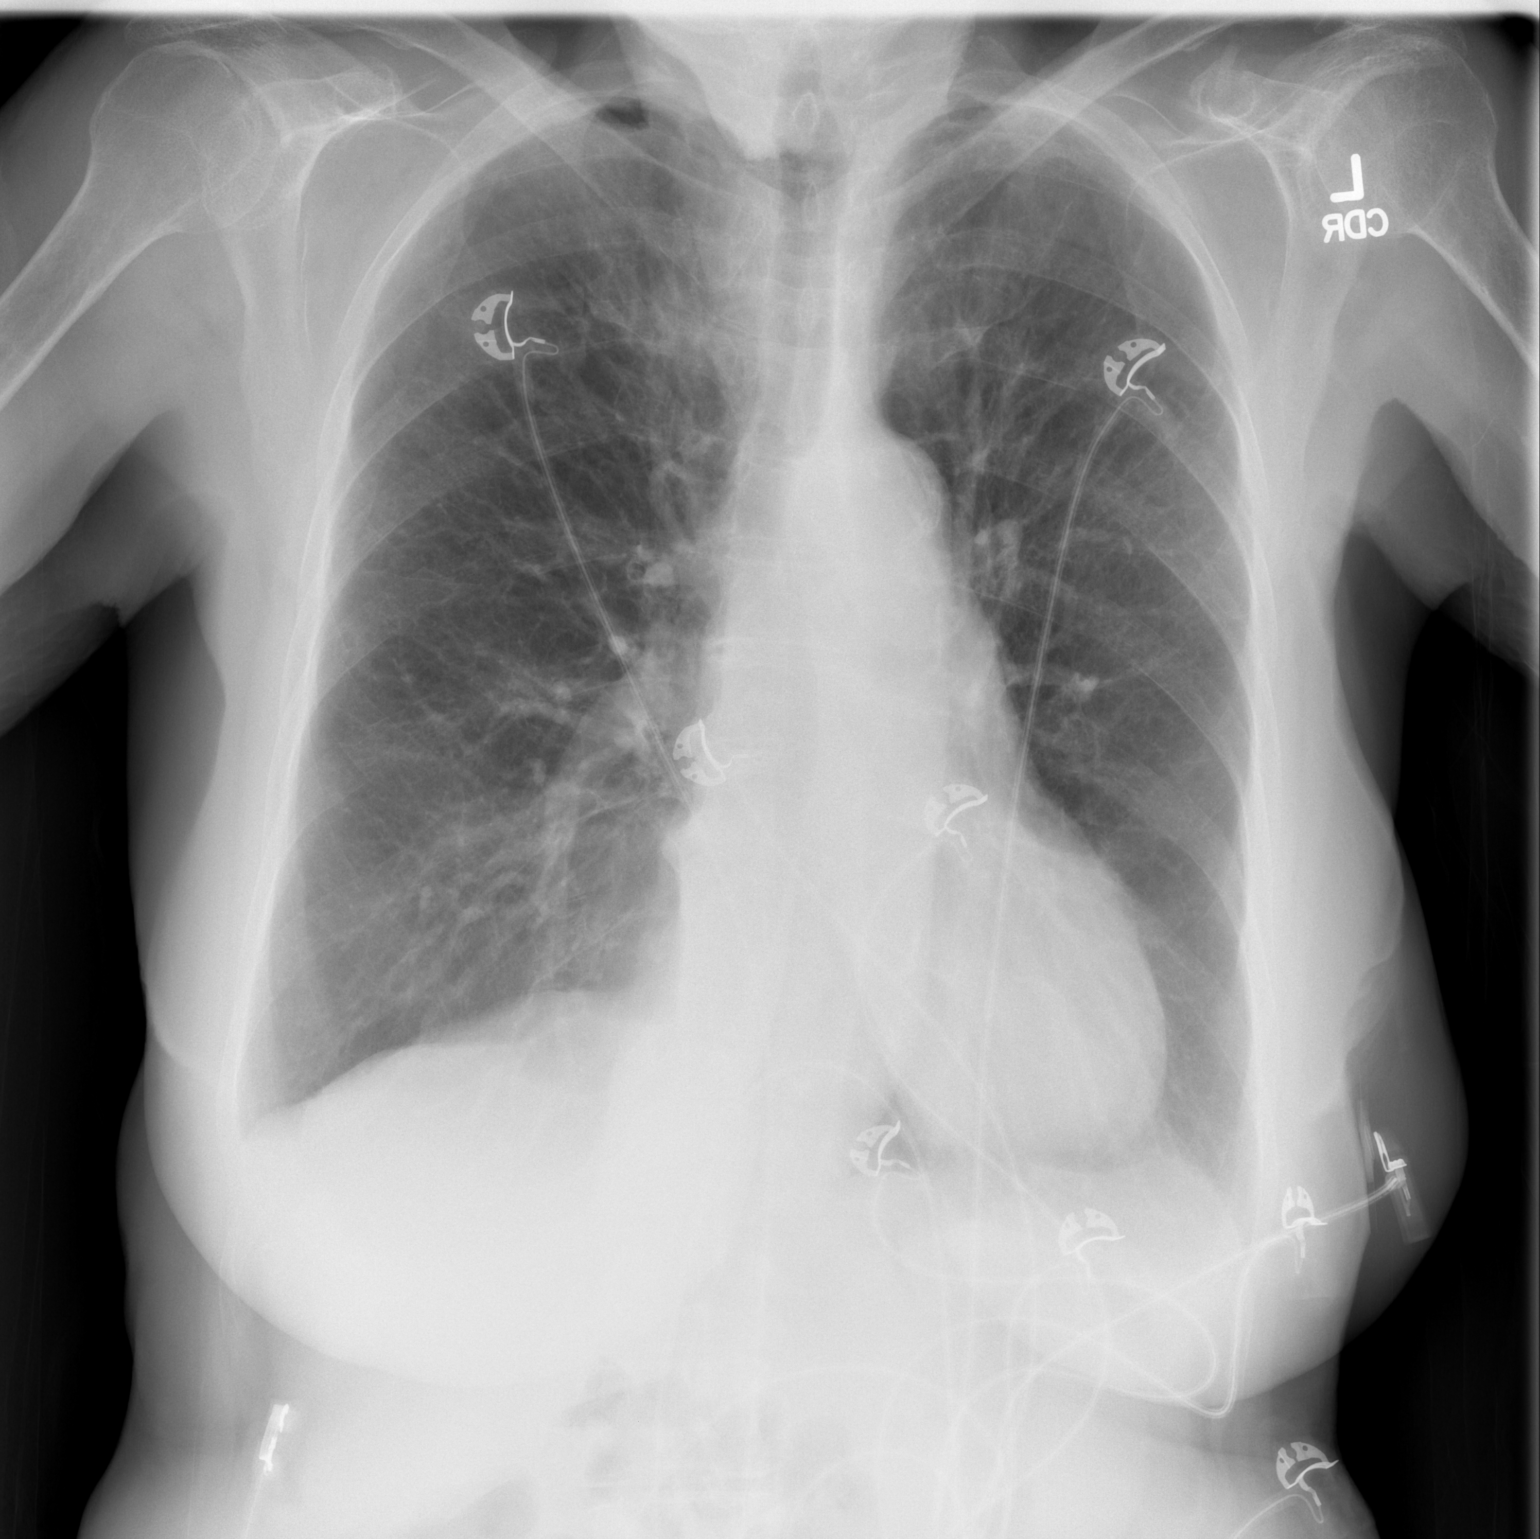

[w chest lat]
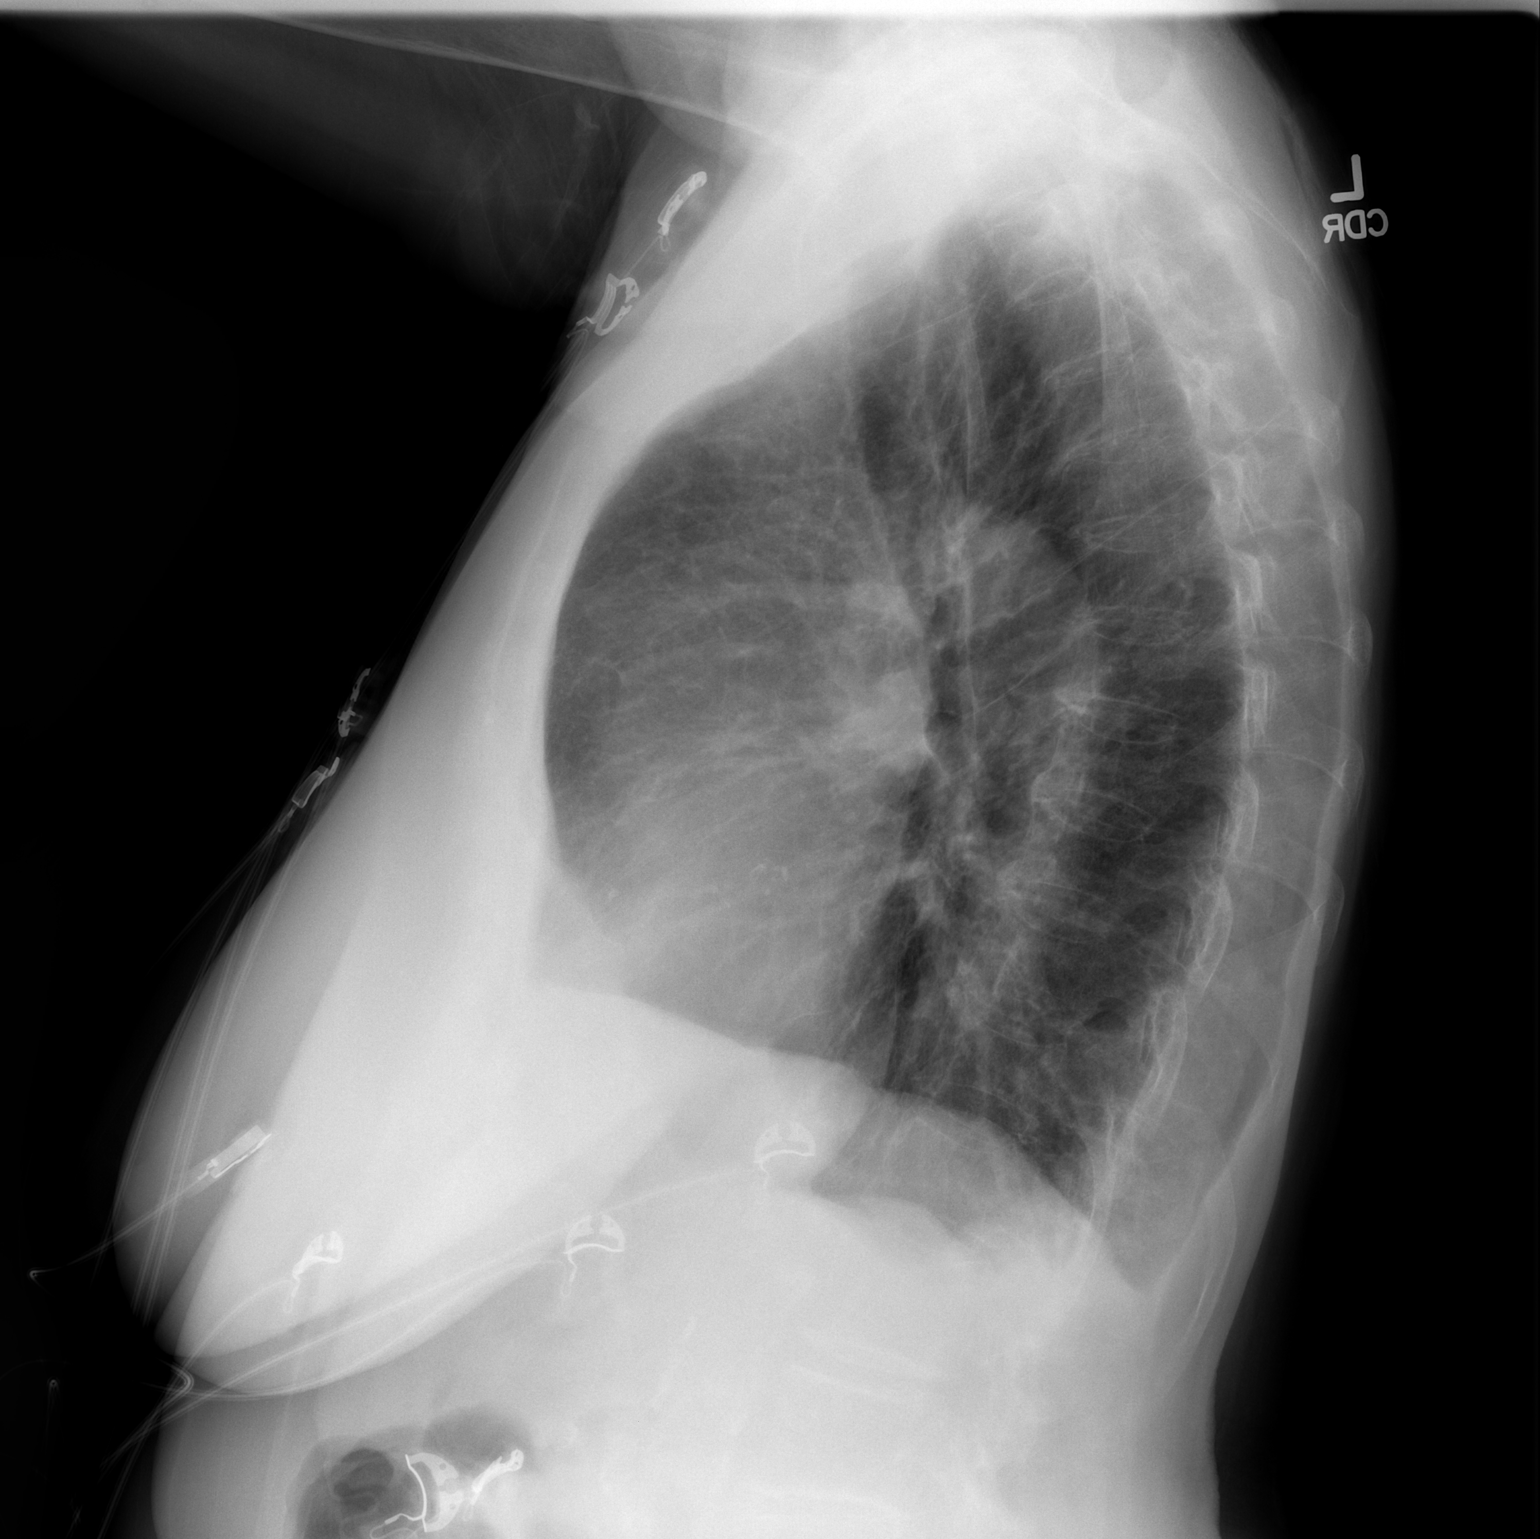

[2 of 2 positions shown; findings below may reference images not displayed]

FINDINGS: The heart size and mediastinal contours are within normal limits.
Both lungs are clear. Pulmonary hyperinflation again seen,
consistent with COPD.
IMPRESSION: Stable exam.  COPD.  No active disease.

## 2020-11-21 ENCOUNTER — Emergency Department (HOSPITAL_COMMUNITY): Payer: Medicare Other

## 2020-11-21 ENCOUNTER — Inpatient Hospital Stay (HOSPITAL_COMMUNITY)
Admission: EM | Admit: 2020-11-21 | Discharge: 2020-11-26 | DRG: 521 | Disposition: A | Discharge status: Skilled Nursing Facility | Payer: Medicare Other | Source: Skilled Nursing Facility | Attending: Family Medicine | Admitting: Family Medicine

## 2020-11-21 ENCOUNTER — Other Ambulatory Visit: Payer: Self-pay

## 2020-11-21 ENCOUNTER — Encounter (HOSPITAL_COMMUNITY): Payer: Self-pay | Admitting: Emergency Medicine

## 2020-11-21 DIAGNOSIS — Z9181 History of falling: Secondary | ICD-10-CM

## 2020-11-21 DIAGNOSIS — Z20822 Contact with and (suspected) exposure to covid-19: Secondary | ICD-10-CM | POA: Diagnosis present

## 2020-11-21 DIAGNOSIS — Z881 Allergy status to other antibiotic agents status: Secondary | ICD-10-CM

## 2020-11-21 DIAGNOSIS — E785 Hyperlipidemia, unspecified: Secondary | ICD-10-CM | POA: Diagnosis present

## 2020-11-21 DIAGNOSIS — W010XXA Fall on same level from slipping, tripping and stumbling without subsequent striking against object, initial encounter: Secondary | ICD-10-CM | POA: Diagnosis present

## 2020-11-21 DIAGNOSIS — E039 Hypothyroidism, unspecified: Secondary | ICD-10-CM | POA: Diagnosis present

## 2020-11-21 DIAGNOSIS — S72001A Fracture of unspecified part of neck of right femur, initial encounter for closed fracture: Secondary | ICD-10-CM | POA: Diagnosis not present

## 2020-11-21 DIAGNOSIS — S72011A Unspecified intracapsular fracture of right femur, initial encounter for closed fracture: Principal | ICD-10-CM | POA: Diagnosis present

## 2020-11-21 DIAGNOSIS — J449 Chronic obstructive pulmonary disease, unspecified: Secondary | ICD-10-CM | POA: Diagnosis present

## 2020-11-21 DIAGNOSIS — I10 Essential (primary) hypertension: Secondary | ICD-10-CM | POA: Diagnosis present

## 2020-11-21 DIAGNOSIS — Z8349 Family history of other endocrine, nutritional and metabolic diseases: Secondary | ICD-10-CM

## 2020-11-21 DIAGNOSIS — Z886 Allergy status to analgesic agent status: Secondary | ICD-10-CM

## 2020-11-21 DIAGNOSIS — E860 Dehydration: Secondary | ICD-10-CM | POA: Diagnosis present

## 2020-11-21 DIAGNOSIS — J9611 Chronic respiratory failure with hypoxia: Secondary | ICD-10-CM | POA: Diagnosis present

## 2020-11-21 DIAGNOSIS — S92901A Unspecified fracture of right foot, initial encounter for closed fracture: Secondary | ICD-10-CM | POA: Diagnosis present

## 2020-11-21 DIAGNOSIS — Y92009 Unspecified place in unspecified non-institutional (private) residence as the place of occurrence of the external cause: Secondary | ICD-10-CM

## 2020-11-21 DIAGNOSIS — Z66 Do not resuscitate: Secondary | ICD-10-CM | POA: Diagnosis present

## 2020-11-21 DIAGNOSIS — K219 Gastro-esophageal reflux disease without esophagitis: Secondary | ICD-10-CM

## 2020-11-21 DIAGNOSIS — J189 Pneumonia, unspecified organism: Secondary | ICD-10-CM | POA: Diagnosis present

## 2020-11-21 DIAGNOSIS — F03918 Unspecified dementia, unspecified severity, with other behavioral disturbance: Secondary | ICD-10-CM

## 2020-11-21 DIAGNOSIS — Z9049 Acquired absence of other specified parts of digestive tract: Secondary | ICD-10-CM

## 2020-11-21 DIAGNOSIS — Z87891 Personal history of nicotine dependence: Secondary | ICD-10-CM

## 2020-11-21 DIAGNOSIS — S728X1A Other fracture of right femur, initial encounter for closed fracture: Secondary | ICD-10-CM

## 2020-11-21 DIAGNOSIS — Z419 Encounter for procedure for purposes other than remedying health state, unspecified: Secondary | ICD-10-CM

## 2020-11-21 DIAGNOSIS — F0391 Unspecified dementia with behavioral disturbance: Secondary | ICD-10-CM | POA: Diagnosis present

## 2020-11-21 DIAGNOSIS — Z7951 Long term (current) use of inhaled steroids: Secondary | ICD-10-CM

## 2020-11-21 DIAGNOSIS — J440 Chronic obstructive pulmonary disease with acute lower respiratory infection: Secondary | ICD-10-CM | POA: Diagnosis present

## 2020-11-21 DIAGNOSIS — Z9981 Dependence on supplemental oxygen: Secondary | ICD-10-CM

## 2020-11-21 DIAGNOSIS — W19XXXA Unspecified fall, initial encounter: Secondary | ICD-10-CM

## 2020-11-21 DIAGNOSIS — Z79899 Other long term (current) drug therapy: Secondary | ICD-10-CM

## 2020-11-21 DIAGNOSIS — R296 Repeated falls: Secondary | ICD-10-CM | POA: Diagnosis present

## 2020-11-21 LAB — CBC WITH DIFFERENTIAL/PLATELET
Abs Immature Granulocytes: 0.07 10*3/uL (ref 0.00–0.07)
Basophils Absolute: 0 10*3/uL (ref 0.0–0.1)
Basophils Relative: 0 %
Eosinophils Absolute: 0 10*3/uL (ref 0.0–0.5)
Eosinophils Relative: 0 %
HCT: 43.4 % (ref 36.0–46.0)
Hemoglobin: 13.6 g/dL (ref 12.0–15.0)
Immature Granulocytes: 1 %
Lymphocytes Relative: 7 %
Lymphs Abs: 0.8 10*3/uL (ref 0.7–4.0)
MCH: 29.1 pg (ref 26.0–34.0)
MCHC: 31.3 g/dL (ref 30.0–36.0)
MCV: 92.9 fL (ref 80.0–100.0)
Monocytes Absolute: 0.5 10*3/uL (ref 0.1–1.0)
Monocytes Relative: 5 %
Neutro Abs: 9.4 10*3/uL — ABNORMAL HIGH (ref 1.7–7.7)
Neutrophils Relative %: 87 %
Platelets: 216 10*3/uL (ref 150–400)
RBC: 4.67 MIL/uL (ref 3.87–5.11)
RDW: 13.3 % (ref 11.5–15.5)
WBC: 10.8 10*3/uL — ABNORMAL HIGH (ref 4.0–10.5)
nRBC: 0 % (ref 0.0–0.2)

## 2020-11-21 LAB — BASIC METABOLIC PANEL
Anion gap: 11 (ref 5–15)
BUN: 22 mg/dL (ref 8–23)
CO2: 26 mmol/L (ref 22–32)
Calcium: 8.9 mg/dL (ref 8.9–10.3)
Chloride: 103 mmol/L (ref 98–111)
Creatinine, Ser: 0.66 mg/dL (ref 0.44–1.00)
GFR, Estimated: >60 mL/min (ref >60)
Glucose, Bld: 134 mg/dL — ABNORMAL HIGH (ref 70–99)
Potassium: 4 mmol/L (ref 3.5–5.1)
Sodium: 140 mmol/L (ref 135–145)

## 2020-11-21 LAB — TYPE AND SCREEN
ABO/RH(D): O POS
Antibody Screen: NEGATIVE

## 2020-11-21 LAB — PROTIME-INR
INR: 1 (ref 0.8–1.2)
Prothrombin Time: 13.2 seconds (ref 11.4–15.2)

## 2020-11-21 LAB — RESP PANEL BY RT-PCR (FLU A&B, COVID) ARPGX2
Influenza A by PCR: NEGATIVE
Influenza B by PCR: NEGATIVE
SARS Coronavirus 2 by RT PCR: NEGATIVE

## 2020-11-21 MED ORDER — GUAIFENESIN ER 600 MG PO TB12
1200.0000 mg | ORAL_TABLET | Freq: Two times a day (BID) | ORAL | Status: DC
  Administered 2020-11-21 – 2020-11-26 (×9): 1200 mg via ORAL
  Filled 2020-11-21 (×9): qty 2

## 2020-11-21 MED ORDER — LORATADINE 10 MG PO TABS
10.0000 mg | ORAL_TABLET | Freq: Every morning | ORAL | Status: DC
  Administered 2020-11-22 – 2020-11-26 (×4): 10 mg via ORAL
  Filled 2020-11-21 (×4): qty 1

## 2020-11-21 MED ORDER — DIVALPROEX SODIUM 125 MG PO CSDR
125.0000 mg | DELAYED_RELEASE_CAPSULE | Freq: Two times a day (BID) | ORAL | Status: DC
  Administered 2020-11-22 – 2020-11-26 (×8): 125 mg via ORAL
  Filled 2020-11-21 (×13): qty 1

## 2020-11-21 MED ORDER — SODIUM CHLORIDE 0.9 % IV SOLN
2.0000 g | INTRAVENOUS | Status: AC
  Administered 2020-11-21 – 2020-11-25 (×5): 2 g via INTRAVENOUS
  Filled 2020-11-21 (×5): qty 20

## 2020-11-21 MED ORDER — ALBUTEROL SULFATE (2.5 MG/3ML) 0.083% IN NEBU: 2.5000 mg | INHALATION_SOLUTION | RESPIRATORY_TRACT | Status: DC | PRN

## 2020-11-21 MED ORDER — LEVOTHYROXINE SODIUM 112 MCG PO TABS
112.0000 ug | ORAL_TABLET | Freq: Every morning | ORAL | Status: DC
  Administered 2020-11-22 – 2020-11-26 (×4): 112 ug via ORAL
  Filled 2020-11-21 (×5): qty 1

## 2020-11-21 MED ORDER — SODIUM CHLORIDE 0.9 % IV SOLN
500.0000 mg | INTRAVENOUS | Status: DC
  Administered 2020-11-21 – 2020-11-23 (×3): 500 mg via INTRAVENOUS
  Filled 2020-11-21 (×6): qty 500

## 2020-11-21 MED ORDER — TRIAMCINOLONE ACETONIDE 55 MCG/ACT NA AERO
4.0000 | INHALATION_SPRAY | Freq: Every day | NASAL | Status: DC
  Administered 2020-11-21 – 2020-11-24 (×3): 4 via NASAL
  Filled 2020-11-21 (×2): qty 43.2

## 2020-11-21 MED ORDER — FENTANYL CITRATE (PF) 100 MCG/2ML IJ SOLN
12.5000 ug | INTRAMUSCULAR | Status: DC | PRN
  Administered 2020-11-22: 12.5 ug via INTRAVENOUS
  Filled 2020-11-21: qty 2

## 2020-11-21 MED ORDER — ALBUTEROL SULFATE HFA 108 (90 BASE) MCG/ACT IN AERS
2.0000 | INHALATION_SPRAY | Freq: Four times a day (QID) | RESPIRATORY_TRACT | Status: DC | PRN
  Filled 2020-11-21: qty 6.7

## 2020-11-21 MED ORDER — FLUTICASONE FUROATE-VILANTEROL 200-25 MCG/INH IN AEPB
1.0000 | INHALATION_SPRAY | Freq: Every day | RESPIRATORY_TRACT | Status: DC
  Administered 2020-11-22 – 2020-11-26 (×4): 1 via RESPIRATORY_TRACT
  Filled 2020-11-21: qty 28

## 2020-11-21 MED ORDER — ONDANSETRON HCL 4 MG/2ML IJ SOLN: 4.0000 mg | Freq: Four times a day (QID) | INTRAMUSCULAR | Status: DC | PRN

## 2020-11-21 MED ORDER — FENTANYL CITRATE (PF) 100 MCG/2ML IJ SOLN: 50.0000 ug | INTRAMUSCULAR | Status: DC | PRN

## 2020-11-21 MED ORDER — LACTATED RINGERS IV SOLN: INTRAVENOUS | Status: AC

## 2020-11-21 MED ORDER — POLYETHYLENE GLYCOL 3350 17 G PO PACK
17.0000 g | PACK | Freq: Every day | ORAL | Status: DC | PRN
  Administered 2020-11-26: 17 g via ORAL
  Filled 2020-11-21: qty 1

## 2020-11-21 MED ORDER — LACTATED RINGERS IV BOLUS
500.0000 mL | Freq: Once | INTRAVENOUS | Status: AC
  Administered 2020-11-21: 500 mL via INTRAVENOUS

## 2020-11-21 MED ORDER — HYDRALAZINE HCL 20 MG/ML IJ SOLN: 10.0000 mg | Freq: Four times a day (QID) | INTRAMUSCULAR | Status: DC | PRN

## 2020-11-21 MED ORDER — PANTOPRAZOLE SODIUM 40 MG PO TBEC
40.0000 mg | DELAYED_RELEASE_TABLET | Freq: Every morning | ORAL | Status: DC
Start: 2020-11-22 — End: 2020-11-26
  Administered 2020-11-22 – 2020-11-26 (×5): 40 mg via ORAL
  Filled 2020-11-21 (×5): qty 1

## 2020-11-21 MED ORDER — FENTANYL CITRATE (PF) 100 MCG/2ML IJ SOLN: 25.0000 ug | INTRAMUSCULAR | Status: DC | PRN

## 2020-11-21 MED ORDER — ENOXAPARIN SODIUM 40 MG/0.4ML ~~LOC~~ SOLN
40.0000 mg | SUBCUTANEOUS | Status: DC
  Administered 2020-11-21 – 2020-11-22 (×2): 40 mg via SUBCUTANEOUS
  Filled 2020-11-21 (×2): qty 0.4

## 2020-11-21 MED ORDER — MELATONIN 3 MG PO TABS
3.0000 mg | ORAL_TABLET | Freq: Every evening | ORAL | Status: DC | PRN
Start: 2020-11-21 — End: 2020-11-26
  Administered 2020-11-22 – 2020-11-24 (×2): 3 mg via ORAL
  Filled 2020-11-21 (×3): qty 1

## 2020-11-21 MED ORDER — AMLODIPINE BESYLATE 10 MG PO TABS
10.0000 mg | ORAL_TABLET | Freq: Every morning | ORAL | Status: DC
Start: 2020-11-22 — End: 2020-11-26
  Administered 2020-11-22 – 2020-11-26 (×5): 10 mg via ORAL
  Filled 2020-11-21 (×5): qty 1

## 2020-11-21 MED ORDER — LORAZEPAM 0.5 MG PO TABS
0.5000 mg | ORAL_TABLET | Freq: Two times a day (BID) | ORAL | Status: DC | PRN
  Administered 2020-11-22: 22:00:00 0.5 mg via ORAL
  Filled 2020-11-21: qty 1

## 2020-11-21 MED ORDER — ESCITALOPRAM OXALATE 10 MG PO TABS
15.0000 mg | ORAL_TABLET | Freq: Every morning | ORAL | Status: DC
  Administered 2020-11-22 – 2020-11-26 (×5): 15 mg via ORAL
  Filled 2020-11-21 (×6): qty 2

## 2020-11-21 NOTE — ED Triage Notes (Signed)
Pt BIB [facility] from [facility] after mechanical fall at 1230 today. X-ray done by facility showing right femoral head fx, shortening and rotation noted to right leg. Pt given 11mcg fentanyl IV pta.

## 2020-11-21 NOTE — Progress Notes (Signed)
Case discussed with the emergency department provider.  This patient has an unstable right hip fracture that is indicated for operative management.  Plan will be for an anterior approach hemiarthroplasty by Dr. Rod Can on Monday morning.  She can have a full diet tomorrow.  She will need to be n.p.o. Sunday night at midnight.  Full consult note to come tomorrow from orthopedics.  We appreciate the hospitalist service for medical management.

## 2020-11-21 NOTE — H&P (Addendum)
History and Physical    Dawn Woods VZC:[id number] DOB: 01/03/1930 DOA: 11/21/2020  PCP: Marletta Lor, MD (Inactive)  Patient coming from: Home   Chief Complaint:  Chief Complaint  Patient presents with  . Fall     HPI:    87 year old female with past medical history of dementia, COPD, chronic respiratory failure on 3 L of oxygen via nasal cannula, hypothyroidism, hypertension who presents to [facility] emergency department via EMS from [facility] skilled nursing facility status post fall with right hip pain.  Patient is an extremely poor historian due to advanced dementia the majority the history is been obtained from discussion with the emergency department staff and review of notes from skilled nursing facility.  Patient explains that earlier today she "tripped on my shoe" resulting in a fall.  Immediately began to complain of severe right hip pain and the right hip x-ray was actually performed at the skilled nursing facility revealing a right hip fracture.  Patient was brought via EMS to [facility] emergency department at the time for evaluation.  For review of skilled nursing facility notes there was no evidence of loss of consciousness or seizure activity precipitating the fall.  No observed head trauma.  Upon evaluation in the emergency department right hip fracture revealed subcapitellar impacted fracture of the right femoral neck with superior displacement.  CT head revealed no active disease.  Chest x-ray however did reveal a left lower lobe infiltrate.  Case was discussed with Dr. Stann Mainland with orthopedic surgery who stated that orthopedics will evaluate the patient in the morning in consultation with tentative planned surgical intervention on Monday by Dr. Lyla Glassing.  The hospitalist group was then called to assist patient with admission to the hospital.  Review of Systems:   Review of Systems  Unable to perform ROS: Mental acuity    Past Medical  History:  Diagnosis Date  . GERD (gastroesophageal reflux disease)   . H/O: hematuria 07/2010  . Hyperlipidemia   . Hypertension   . Hypothyroidism   . PONV (postoperative nausea and vomiting)   . Tobacco abuse 01/04/2012    Past Surgical History:  Procedure Laterality Date  . APPENDECTOMY     age 43  . CHOLECYSTECTOMY     age 31  . DILATION AND CURETTAGE OF UTERUS     multiple's  . ESOPHAGOGASTRODUODENOSCOPY N/A 08/06/2013   Procedure: ESOPHAGOGASTRODUODENOSCOPY (EGD);  Surgeon: Lear Ng, MD;  Location: [facility];  Service: Endoscopy;  Laterality: N/A;  . FRACTURE SURGERY  Dec 2009   left wrist  . OOPHORECTOMY  1954  . TONSILLECTOMY     age 74     reports that she quit smoking about 7 years ago. Her smoking use included cigarettes. She has a 31.50 pack-year smoking history. She has never used smokeless tobacco. She reports that she does not drink alcohol and does not use drugs.  Allergies  Allergen Reactions  . Aspirin Other (See Comments)    GI Bleed  . Ibuprofen Other (See Comments)    GI Bleed  . Ciprofloxacin Hives    Family History  Problem Relation Age of Onset  . Thyroid disease Mother   . Dementia Mother   . Thyroid disease Sister 60     Prior to Admission medications   Medication Sig Start Date End Date Taking? Authorizing Provider  albuterol (VENTOLIN HFA) 108 (90 Base) MCG/ACT inhaler Inhale 2 puffs into the lungs every 6 (six) hours as needed for wheezing or shortness  of breath.   Yes [provider]  amLODipine (NORVASC) 10 MG tablet Take 10 mg by mouth every morning.   Yes [provider]  budesonide-formoterol (SYMBICORT) 160-4.5 MCG/ACT inhaler Inhale 2 puffs into the lungs 2 (two) times daily.   Yes [provider]  CRANBERRY EXTRACT PO Take 15,000 mg by mouth every morning.   Yes [provider]  divalproex (DEPAKOTE SPRINKLE) 125 MG capsule Take 125 mg by mouth See admin instructions. Take one  capsule (125 mg) by mouth twice daily - morning and midday   Yes [provider]  escitalopram (LEXAPRO) 10 MG tablet Take 15 mg by mouth every morning.   Yes [provider]  Guaifenesin (MUCINEX MAXIMUM STRENGTH) 1200 MG TB12 Take 1,200 mg by mouth 2 (two) times daily.   Yes [provider]  levothyroxine (SYNTHROID) 112 MCG tablet Take 112 mcg by mouth every morning.   Yes [provider]  loratadine (CLARITIN) 10 MG tablet Take 10 mg by mouth every morning.   Yes [provider]  LORazepam (ATIVAN) 0.5 MG tablet Take 0.5 mg by mouth 2 (two) times daily as needed for anxiety (agitation).   Yes [provider]  melatonin 3 MG TABS tablet Take 3 mg by mouth at bedtime as needed (insomnia).   Yes [provider]  OXYGEN Inhale 3 L into the lungs See admin instructions. To maintain saturations at or above 90%   Yes [provider]  pantoprazole (PROTONIX) 40 MG tablet TAKE 1 TABLET TWICE A DAY Patient taking differently: Take 40 mg by mouth every morning. 06/24/16  Yes Marletta Lor, MD  Sodium Fluoride (PREVIDENT 5000 PLUS DT) Place 1 application onto teeth See admin instructions. Brush topically on teeth with a toothbrush after evening mouth care   Yes [provider]  triamcinolone (NASACORT) 55 MCG/ACT AERO nasal inhaler Place 4 sprays into the nose at bedtime. 2 sprays into each nostril   Yes [provider]  Vitamin D, Ergocalciferol, (DRISDOL) 50000 units CAPS capsule Take 50,000 Units by mouth every Thursday.   Yes [provider]    Physical Exam: Vitals:   11/21/20 1800 11/21/20 1815 11/21/20 1830 11/21/20 2040  BP: (!) 153/72   (!) 153/88  Pulse: 71 72 70 76  Resp: (!) 27  (!) 21 (!) 23  Temp:      TempSrc:      SpO2: 94%  97% 98%    Constitutional: Patient is awake and alert but disoriented, only alert to self.  Patient is in mild distress due to right hip pain.   Skin: no  rashes, no lesions, notable poor skin turgor.  Eyes: Pupils are equally reactive to light.  No evidence of scleral icterus or conjunctival pallor.  ENMT: Dry mucous membranes noted.  Posterior pharynx clear of any exudate or lesions.   Neck: normal, supple, no masses, no thyromegaly.  No evidence of jugular venous distension.   Respiratory: Notable bibasilar rales, worse in the left base without any associated wheezing.  Patient is currently not exhibiting any increased respiratory effort or evidence of accessory muscle use. Cardiovascular: Regular rate and rhythm, no murmurs / rubs / gallops. No extremity edema. 2+ pedal pulses. No carotid bruits.  Chest:   Nontender without crepitus or deformity.   Back:   Nontender without crepitus or deformity. Abdomen: Abdomen is soft and nontender.  No evidence of intra-abdominal masses.  Positive bowel sounds noted in all quadrants.   Musculoskeletal: Severe  right hip pain with both passive and active range of motion.  No joint deformity upper and lower extremities.  no contractures. Normal muscle tone.  Neurologic: Limited movement of the right lower extremity due to pain.  Otherwise patient moving all extremities spontaneously.  Cranial nerves II through XII grossly intact.  Sensation grossly intact.  Patient is responsive to verbal and painful stimuli.  Patient is only oriented x1  psychiatric: Patient exhibits normal mood with appropriate affect.  Patient currently does not seem to possess insight as to her current situation  Labs on Admission: I have personally reviewed following labs and imaging studies -   CBC: Recent Labs  Lab 11/21/20 1805  WBC 10.8*  NEUTROABS 9.4*  HGB 13.6  HCT 43.4  MCV 92.9  PLT 825   Basic Metabolic Panel: Recent Labs  Lab 11/21/20 1805  NA 140  K 4.0  CL 103  CO2 26  GLUCOSE 134*  BUN 22  CREATININE 0.66  CALCIUM 8.9   GFR: CrCl cannot be calculated (Unknown ideal weight.). Liver Function Tests: No  results for input(s): AST, ALT, ALKPHOS, BILITOT, PROT, ALBUMIN in the last 168 hours. No results for input(s): LIPASE, AMYLASE in the last 168 hours. No results for input(s): AMMONIA in the last 168 hours. Coagulation Profile: Recent Labs  Lab 11/21/20 1805  INR 1.0   Cardiac Enzymes: No results for input(s): CKTOTAL, CKMB, CKMBINDEX, TROPONINI in the last 168 hours. BNP (last 3 results) No results for input(s): PROBNP in the last 8760 hours. HbA1C: No results for input(s): HGBA1C in the last 72 hours. CBG: No results for input(s): GLUCAP in the last 168 hours. Lipid Profile: No results for input(s): CHOL, HDL, LDLCALC, TRIG, CHOLHDL, LDLDIRECT in the last 72 hours. Thyroid Function Tests: No results for input(s): TSH, T4TOTAL, FREET4, T3FREE, THYROIDAB in the last 72 hours. Anemia Panel: No results for input(s): VITAMINB12, FOLATE, FERRITIN, TIBC, IRON, RETICCTPCT in the last 72 hours. Urine analysis:    Component Value Date/Time   COLORURINE YELLOW 10/18/2016 1811   APPEARANCEUR HAZY (A) 10/18/2016 1811   LABSPEC 1.018 10/18/2016 1811   PHURINE 5.0 10/18/2016 1811   GLUCOSEU NEGATIVE 10/18/2016 1811   HGBUR SMALL (A) 10/18/2016 1811   HGBUR trace-intact 06/28/2010 0916   BILIRUBINUR NEGATIVE 10/18/2016 1811   BILIRUBINUR small 12/18/2013 1220   KETONESUR 5 (A) 10/18/2016 1811   PROTEINUR NEGATIVE 10/18/2016 1811   UROBILINOGEN 0.2 12/18/2013 1220   UROBILINOGEN 1.0 06/28/2010 0916   NITRITE NEGATIVE 10/18/2016 1811   LEUKOCYTESUR NEGATIVE 10/18/2016 1811    Radiological Exams on Admission - Personally Reviewed: DG Chest 1 View  Result Date: 11/21/2020 CLINICAL DATA:  Pain post fall. EXAM: CHEST  1 VIEW COMPARISON:  September 24, 2020 FINDINGS: Enlarged cardiac silhouette. Calcific atherosclerotic disease and tortuosity of the aorta. Left costophrenic angle consolidation. Increased interstitial markings. Osseous structures are without acute abnormality. Soft tissues are  grossly normal. IMPRESSION: 1. Left costophrenic angle consolidation which may represent pleural effusion, atelectasis or airspace disease. 2. Increased interstitial markings which may be seen with pulmonary edema. Electronically Signed   By: Fidela Salisbury M.D.   On: 11/21/2020 18:03   CT HEAD WO CONTRAST  Result Date: 11/21/2020 CLINICAL DATA:  Neck injury. EXAM: CT HEAD WITHOUT CONTRAST CT CERVICAL SPINE WITHOUT CONTRAST TECHNIQUE: Multidetector CT imaging of the head and cervical spine was performed following the standard protocol without intravenous contrast. Multiplanar CT image reconstructions of the cervical spine were also generated. COMPARISON:  October 15, 2016. FINDINGS: CT HEAD FINDINGS Brain: Mild chronic ischemic white matter disease is noted. Mild diffuse cortical atrophy is noted. No mass effect or midline shift is noted. Ventricular size is within normal limits. There is no evidence of mass lesion, hemorrhage or acute infarction. Vascular: No hyperdense vessel or unexpected calcification. Skull: Normal. Negative for fracture or focal lesion. Sinuses/Orbits: No acute finding. Other: None. CT CERVICAL SPINE FINDINGS Alignment: Mild grade 1 anterolisthesis of C4-5 is noted secondary to posterior facet joint hypertrophy. Skull base and vertebrae: No acute fracture. No primary bone lesion or focal pathologic process. Soft tissues and spinal canal: No prevertebral fluid or swelling. No visible canal hematoma. Disc levels: Moderate degenerative disc disease is noted at C3-4 and C5-6. Severe degenerative disc disease is noted at C6-7. Upper chest: Negative. Other: None. IMPRESSION: 1. Mild chronic ischemic white matter disease. Mild diffuse cortical atrophy. No acute intracranial abnormality seen. 2. Multilevel degenerative disc disease. No acute abnormality seen in the cervical spine. Electronically Signed   By: Marijo Conception M.D.   On: 11/21/2020 19:18   CT CERVICAL SPINE WO  CONTRAST  Result Date: 11/21/2020 CLINICAL DATA:  Neck injury. EXAM: CT HEAD WITHOUT CONTRAST CT CERVICAL SPINE WITHOUT CONTRAST TECHNIQUE: Multidetector CT imaging of the head and cervical spine was performed following the standard protocol without intravenous contrast. Multiplanar CT image reconstructions of the cervical spine were also generated. COMPARISON:  October 15, 2016. FINDINGS: CT HEAD FINDINGS Brain: Mild chronic ischemic white matter disease is noted. Mild diffuse cortical atrophy is noted. No mass effect or midline shift is noted. Ventricular size is within normal limits. There is no evidence of mass lesion, hemorrhage or acute infarction. Vascular: No hyperdense vessel or unexpected calcification. Skull: Normal. Negative for fracture or focal lesion. Sinuses/Orbits: No acute finding. Other: None. CT CERVICAL SPINE FINDINGS Alignment: Mild grade 1 anterolisthesis of C4-5 is noted secondary to posterior facet joint hypertrophy. Skull base and vertebrae: No acute fracture. No primary bone lesion or focal pathologic process. Soft tissues and spinal canal: No prevertebral fluid or swelling. No visible canal hematoma. Disc levels: Moderate degenerative disc disease is noted at C3-4 and C5-6. Severe degenerative disc disease is noted at C6-7. Upper chest: Negative. Other: None. IMPRESSION: 1. Mild chronic ischemic white matter disease. Mild diffuse cortical atrophy. No acute intracranial abnormality seen. 2. Multilevel degenerative disc disease. No acute abnormality seen in the cervical spine. Electronically Signed   By: Marijo Conception M.D.   On: 11/21/2020 19:18   DG Hip Unilat With Pelvis 2-3 Views Right  Result Date: 11/21/2020 CLINICAL DATA:  Pain post fall. EXAM: DG HIP (WITH OR WITHOUT PELVIS) 2-3V RIGHT COMPARISON:  None. FINDINGS: Sub capitellar impacted fracture of the right femoral neck with superior displacement of the distal fracture fragment. Femoral head is normally located within the  acetabulum. IMPRESSION: Sub capitellar impacted fracture of the right femoral neck with superior displacement of the distal fracture fragment. Electronically Signed   By: Fidela Salisbury M.D.   On: 11/21/2020 18:04    EKG: Personally reviewed.  Rhythm is normal sinus rhythm with heart rate of 73 bpm.  Multiple PVCs noted.   no dynamic ST segment changes appreciated.  Assessment/Plan Principal Problem:   Closed right hip fracture [facility])   Patient presenting status post fall with evidence of impacted fracture of the right femoral neck with displacement  Concerning the etiology, there is evidence of a left lower lobe infiltrate on chest x-ray concerning for early pneumonia  which may have precipitated the patient's fall  Furthermore, clinically patient appears  somewhat dehydrated which also could have contributed to the fall  Initiating intravenous fluids  Treating concurrent pneumonia  Case is already been discussed with Dr. Stann Mainland in the emergency department staff states that patient will tentatively be slated for operative intervention on Monday  As needed low-dose opiate-based analgesics for substantial pain  Active Problems:   Pneumonia of left lower lobe due to infectious organism   Patient exhibiting a early left lower lobe infiltrate concerning for pneumonia on chest x-ray with mild leukocytosis  Concern for pneumonia, possibly secondary to Streptococcus  Initiating intravenous ceftriaxone and azithromycin  Titrate submental oxygen to achieve oxygen saturations of 89 to 92%  Blood cultures obtained    Fall at home, initial encounter   Please see assessment and plan above    Hypothyroidism   Continue home regimen of Synthroid    GERD without esophagitis   Continue home regimen of PPI    Essential hypertension   Continue home regimen of antihypertensive therapy including amlodipine    COPD (chronic obstructive pulmonary disease) ([place])   No evidence  of COPD exacerbation this time  Continue home regimen of maintenance inhalers  As needed bronchodilator therapy for episodic shortness of breath and wheezing.    Chronic respiratory failure with hypoxia [facility])    Patient is on 3 L oxygen via nasal cannula at all times since being hospitalized at [facility] in December 2021 for a COPD exacerbation  We will continue supplemental oxygen and titrate as necessary considering concurrent pneumonia with target oxygen saturations being 89-92%  Dementia with behavioral disturbance   Longstanding history of advanced dementia  Continuing to achieve pain control with as needed analgesics with smallest doses possible to minimize worsening confusion  Fall precautions  Frequent redirection of patient   Code Status:  DNR Family Communication: Care plan discussed at length with son who is power of attorney via phone conversation  Status is: Observation  The patient remains OBS appropriate and will d/c before 2 midnights.  Dispo: The patient is from: SNF              Anticipated d/c is to: SNF              Patient currently is not medically stable to d/c.   Difficult to place patient No        Vernelle Emerald MD [facility] Pager [phone]  If 7PM-7AM, please contact night-coverage [email or web address] Use universal [place] password for that web site. If you do not have the password, please call the hospital operator.  11/21/2020, 8:55 PM

## 2020-11-21 NOTE — ED Provider Notes (Signed)
[facility] Provider Note   CSN: [id number] Arrival date & time: 11/21/20  1720     History Chief Complaint  Patient presents with  . Fall    [facility] is a 87 y.o. female. 5 caveat secondary to dementia. Patient brought in by EMS from [facility] burn after mechanical fall at her facility. This occurred earlier today. She also states she hit her head. No reported loss of consciousness. Reportedly x-ray done at the facility shows right femoral head fracture. Received fentanyl prior to arrival. Not on any blood thinners.   Fall This is a new problem. The current episode started 6 to 12 hours ago. The problem occurs constantly. The problem has not changed since onset.Pertinent negatives include no chest pain, no abdominal pain, no headaches and no shortness of breath. The symptoms are aggravated by bending and twisting. Nothing relieves the symptoms. She has tried rest for the symptoms. The treatment provided no relief.  Hip Pain This is a new problem. The current episode started 6 to 12 hours ago. The problem occurs constantly. The problem has not changed since onset.Pertinent negatives include no chest pain, no abdominal pain, no headaches and no shortness of breath. The symptoms are aggravated by twisting and bending. Nothing relieves the symptoms. She has tried rest for the symptoms. The treatment provided no relief.       Past Medical History:  Diagnosis Date  . GERD (gastroesophageal reflux disease)   . H/O: hematuria 07/2010  . Hyperlipidemia   . Hypertension   . Hypothyroidism   . PONV (postoperative nausea and vomiting)     Patient Active Problem List   Diagnosis Date Noted  . Confusion 10/15/2016  . Elevated troponin 10/15/2016  . Bronchitis 12/02/2015  . Left leg weakness 12/02/2015  . Dementia without behavioral disturbance ([place]) 12/02/2015  . Hypertrophic obstructive cardiomyopathy ([place]) 12/02/2015  . Depression 12/02/2015   . Malnutrition of moderate degree 11/26/2015  . Acute respiratory failure with hypoxia ([place]) 11/26/2015  . Acute encephalopathy 11/24/2015  . Hypokalemia 11/24/2015  . Dehydration 11/24/2015  . Generalized weakness 11/24/2015  . Lower urinary tract infectious disease   . BPV (benign positional vertigo) 08/25/2014  . Melena 08/05/2013  . Personal history of colonic polyps 08/05/2013  . Tobacco abuse 01/04/2012  . Urinary tract infection 05/19/2010  . Hypothyroidism 04/21/2008  . Dyslipidemia 04/21/2008  . GERD 04/21/2008    Past Surgical History:  Procedure Laterality Date  . APPENDECTOMY     age 87  . CHOLECYSTECTOMY     age 31  . DILATION AND CURETTAGE OF UTERUS     multiple's  . ESOPHAGOGASTRODUODENOSCOPY N/A 08/06/2013   Procedure: ESOPHAGOGASTRODUODENOSCOPY (EGD);  Surgeon: Lear Ng, MD;  Location: [facility];  Service: Endoscopy;  Laterality: N/A;  . FRACTURE SURGERY  Dec 2009   left wrist  . OOPHORECTOMY  1954  . TONSILLECTOMY     age 86     OB History   No obstetric history on file.     Family History  Problem Relation Age of Onset  . Thyroid disease Mother   . Dementia Mother   . Thyroid disease Sister 32    Social History   Tobacco Use  . Smoking status: Former Smoker    Packs/day: 0.50    Years: 63.00    Pack years: 31.50    Types: Cigarettes    Quit date: 08/05/2013    Years since quitting: 7.3  . Smokeless tobacco: Never Used  Substance Use Topics  . Alcohol use: No    Alcohol/week: 0.0 standard drinks  . Drug use: No    Home Medications Prior to Admission medications   Medication Sig Start Date End Date Taking? Authorizing Provider  Vitamin D, Ergocalciferol, (DRISDOL) 50000 units CAPS capsule Take 50,000 Units by mouth every Thursday.   Yes [provider]  acetaminophen (TYLENOL) 325 MG tablet Take 650 mg by mouth every 4 (four) hours as needed (for pain).    [provider]  cetirizine (ZYRTEC) 10 MG  tablet Take 10 mg by mouth daily.    [provider]  Cranberry 425 MG CAPS Take 425 mg by mouth 2 (two) times daily.    [provider]  escitalopram (LEXAPRO) 10 MG tablet Take 15 mg by mouth daily.    [provider]  feeding supplement, ENSURE ENLIVE, (ENSURE ENLIVE) LIQD Take 237 mLs by mouth 2 (two) times daily between meals. Patient not taking: Reported on 10/15/2016 11/27/15   [name], MD  ferrous sulfate 325 (65 FE) MG tablet Take 325 mg by mouth every evening.     [provider]  guaiFENesin-codeine (ROBITUSSIN AC) 100-10 MG/5ML syrup Take 10 mLs by mouth 4 (four) times daily as needed for cough.    [provider]  levothyroxine (SYNTHROID, LEVOTHROID) 100 MCG tablet Take 1 tablet (100 mcg total) by mouth daily before breakfast. 10/21/16   Verlee Monte, MD  lisinopril (PRINIVIL,ZESTRIL) 10 MG tablet Take 1 tablet (10 mg total) by mouth daily. 10/22/16   Verlee Monte, MD  meclizine (ANTIVERT) 25 MG tablet Take 25 mg by mouth every 4 (four) hours as needed. Dizziness 10/26/15   [provider]  metoprolol tartrate (LOPRESSOR) 25 MG tablet Take 0.5 tablets (12.5 mg total) by mouth 2 (two) times daily. Patient not taking: Reported on 10/15/2016 11/28/15   [name], MD  mometasone (NASONEX) 50 MCG/ACT nasal spray Place 2 sprays into the nose daily.    [provider]  pantoprazole (PROTONIX) 40 MG tablet TAKE 1 TABLET TWICE A DAY 06/24/16   Marletta Lor, MD  simvastatin (ZOCOR) 10 MG tablet Take 1 tablet (10 mg total) by mouth at bedtime. 08/05/15   Marletta Lor, MD  Sodium Fluoride (PREVIDENT 5000 PLUS DT) Place 1 application onto teeth every evening.    [provider]  triamcinolone (NASACORT AQ) 55 MCG/ACT AERO nasal inhaler Place 1 spray into the nose daily.    [provider]    Allergies    Aspirin, Ibuprofen, and Ciprofloxacin  Review of Systems   Review of Systems   Constitutional: Negative for fever.  HENT: Negative for sore throat.   Eyes: Negative for visual disturbance.  Respiratory: Negative for shortness of breath.   Cardiovascular: Negative for chest pain.  Gastrointestinal: Negative for abdominal pain.  Genitourinary: Negative for dysuria.  Musculoskeletal: Negative for neck pain.  Skin: Negative for rash.  Neurological: Negative for headaches.    Physical Exam Updated Vital Signs BP (!) 165/70 (BP Location: Left Arm)   Pulse 71   Temp (!) 97.3 F (36.3 C) (Oral)   Resp 20   SpO2 99%   Physical Exam Vitals and nursing note reviewed.  Constitutional:      General: She is not in acute distress.    Appearance: Normal appearance. She is well-developed and well-nourished.  HENT:     Head: Normocephalic and atraumatic.  Eyes:     Conjunctiva/sclera: Conjunctivae normal.  Neck:  Comments: In cervical collar trach midline Cardiovascular:     Rate and Rhythm: Normal rate and regular rhythm.     Heart sounds: No murmur heard.   Pulmonary:     Effort: Pulmonary effort is normal. No respiratory distress.     Breath sounds: Normal breath sounds.  Abdominal:     Palpations: Abdomen is soft.     Tenderness: There is no abdominal tenderness.  Musculoskeletal:        General: Tenderness and deformity present. No edema.     Comments: Full range of motion of upper extremities bilaterally without any pain or limitations. Pain with manipulation at right hip. Knee and ankle nontender. Full range of motion of left lower extremity without any pain or limitations.  Skin:    General: Skin is warm and dry.     Capillary Refill: Capillary refill takes less than 2 seconds.  Neurological:     General: No focal deficit present.     Mental Status: She is alert. Mental status is at baseline.  Psychiatric:        Mood and Affect: Mood and affect normal.     ED Results / Procedures / Treatments   Labs (all labs ordered are listed, but only  abnormal results are displayed) Labs Reviewed  BASIC METABOLIC PANEL - Abnormal; Notable for the following components:      Result Value   Glucose, Bld 134 (*)    All other components within normal limits  CBC WITH DIFFERENTIAL/PLATELET - Abnormal; Notable for the following components:   WBC 10.8 (*)    Neutro Abs 9.4 (*)    All other components within normal limits  C-REACTIVE PROTEIN - Abnormal; Notable for the following components:   CRP 4.5 (*)    All other components within normal limits  RESP PANEL BY RT-PCR (FLU A&B, COVID) ARPGX2  CULTURE, BLOOD (ROUTINE X 2)  CULTURE, BLOOD (ROUTINE X 2)  PROTIME-INR  PROCALCITONIN  LACTIC ACID, PLASMA  HIV ANTIBODY (ROUTINE TESTING W REFLEX)  HEMOGLOBIN A1C  URINALYSIS, COMPLETE (UACMP) WITH MICROSCOPIC  TYPE AND SCREEN    EKG EKG Interpretation  Date/Time:  Saturday November 21 2020 17:35:41 EST Ventricular Rate:  73 PR Interval:    QRS Duration: 96 QT Interval:  433 QTC Calculation: 478 R Axis:   2 Text Interpretation: Sinus rhythm Ventricular trigeminy Right atrial enlargement Consider anterior infarct increased ectopy from prior 1/18 Confirmed by Aletta Edouard [phone]) on 11/21/2020 5:42:11 PM   Radiology DG Chest 1 View  Result Date: 11/21/2020 CLINICAL DATA:  Pain post fall. EXAM: CHEST  1 VIEW COMPARISON:  September 24, 2020 FINDINGS: Enlarged cardiac silhouette. Calcific atherosclerotic disease and tortuosity of the aorta. Left costophrenic angle consolidation. Increased interstitial markings. Osseous structures are without acute abnormality. Soft tissues are grossly normal. IMPRESSION: 1. Left costophrenic angle consolidation which may represent pleural effusion, atelectasis or airspace disease. 2. Increased interstitial markings which may be seen with pulmonary edema. Electronically Signed   By: Fidela Salisbury M.D.   On: 11/21/2020 18:03   CT HEAD WO CONTRAST  Result Date: 11/21/2020 CLINICAL DATA:  Neck injury.  EXAM: CT HEAD WITHOUT CONTRAST CT CERVICAL SPINE WITHOUT CONTRAST TECHNIQUE: Multidetector CT imaging of the head and cervical spine was performed following the standard protocol without intravenous contrast. Multiplanar CT image reconstructions of the cervical spine were also generated. COMPARISON:  October 15, 2016. FINDINGS: CT HEAD FINDINGS Brain: Mild chronic ischemic white matter disease is noted. Mild diffuse cortical atrophy is  noted. No mass effect or midline shift is noted. Ventricular size is within normal limits. There is no evidence of mass lesion, hemorrhage or acute infarction. Vascular: No hyperdense vessel or unexpected calcification. Skull: Normal. Negative for fracture or focal lesion. Sinuses/Orbits: No acute finding. Other: None. CT CERVICAL SPINE FINDINGS Alignment: Mild grade 1 anterolisthesis of C4-5 is noted secondary to posterior facet joint hypertrophy. Skull base and vertebrae: No acute fracture. No primary bone lesion or focal pathologic process. Soft tissues and spinal canal: No prevertebral fluid or swelling. No visible canal hematoma. Disc levels: Moderate degenerative disc disease is noted at C3-4 and C5-6. Severe degenerative disc disease is noted at C6-7. Upper chest: Negative. Other: None. IMPRESSION: 1. Mild chronic ischemic white matter disease. Mild diffuse cortical atrophy. No acute intracranial abnormality seen. 2. Multilevel degenerative disc disease. No acute abnormality seen in the cervical spine. Electronically Signed   By: Marijo Conception M.D.   On: 11/21/2020 19:18   CT CERVICAL SPINE WO CONTRAST  Result Date: 11/21/2020 CLINICAL DATA:  Neck injury. EXAM: CT HEAD WITHOUT CONTRAST CT CERVICAL SPINE WITHOUT CONTRAST TECHNIQUE: Multidetector CT imaging of the head and cervical spine was performed following the standard protocol without intravenous contrast. Multiplanar CT image reconstructions of the cervical spine were also generated. COMPARISON:  October 15, 2016.  FINDINGS: CT HEAD FINDINGS Brain: Mild chronic ischemic white matter disease is noted. Mild diffuse cortical atrophy is noted. No mass effect or midline shift is noted. Ventricular size is within normal limits. There is no evidence of mass lesion, hemorrhage or acute infarction. Vascular: No hyperdense vessel or unexpected calcification. Skull: Normal. Negative for fracture or focal lesion. Sinuses/Orbits: No acute finding. Other: None. CT CERVICAL SPINE FINDINGS Alignment: Mild grade 1 anterolisthesis of C4-5 is noted secondary to posterior facet joint hypertrophy. Skull base and vertebrae: No acute fracture. No primary bone lesion or focal pathologic process. Soft tissues and spinal canal: No prevertebral fluid or swelling. No visible canal hematoma. Disc levels: Moderate degenerative disc disease is noted at C3-4 and C5-6. Severe degenerative disc disease is noted at C6-7. Upper chest: Negative. Other: None. IMPRESSION: 1. Mild chronic ischemic white matter disease. Mild diffuse cortical atrophy. No acute intracranial abnormality seen. 2. Multilevel degenerative disc disease. No acute abnormality seen in the cervical spine. Electronically Signed   By: Marijo Conception M.D.   On: 11/21/2020 19:18   DG Knee Right Port  Result Date: 11/22/2020 CLINICAL DATA:  Femoral fracture EXAM: PORTABLE RIGHT KNEE - 1-2 VIEW COMPARISON:  November 21, 2020 FINDINGS: Osteopenia. No acute fracture or dislocation. Mild degenerative changes of the medial compartment. No area of erosion or osseous destruction. No unexpected radiopaque foreign body. Vascular calcifications. IMPRESSION: No acute fracture or dislocation of the right knee. Electronically Signed   By: Valentino Saxon MD   On: 11/22/2020 09:05   DG Hip Unilat With Pelvis 2-3 Views Right  Result Date: 11/21/2020 CLINICAL DATA:  Pain post fall. EXAM: DG HIP (WITH OR WITHOUT PELVIS) 2-3V RIGHT COMPARISON:  None. FINDINGS: Sub capitellar impacted fracture of the right  femoral neck with superior displacement of the distal fracture fragment. Femoral head is normally located within the acetabulum. IMPRESSION: Sub capitellar impacted fracture of the right femoral neck with superior displacement of the distal fracture fragment. Electronically Signed   By: Fidela Salisbury M.D.   On: 11/21/2020 18:04    Procedures Procedures   Medications Ordered in ED Medications  lactated ringers bolus 500 mL (500 mLs  Intravenous New Bag/Given 11/21/20 2101)    Followed by  lactated ringers infusion ( Intravenous New Bag/Given 11/21/20 2240)  pantoprazole (PROTONIX) EC tablet 40 mg (40 mg Oral Given 11/22/20 0833)  escitalopram (LEXAPRO) tablet 15 mg (15 mg Oral Given 11/22/20 0833)  triamcinolone (NASACORT) nasal inhaler 4 spray (4 sprays Nasal Given 11/21/20 2304)  loratadine (CLARITIN) tablet 10 mg (10 mg Oral Given 11/22/20 0834)  guaiFENesin (MUCINEX) 12 hr tablet 1,200 mg (1,200 mg Oral Given 11/22/20 0833)  levothyroxine (SYNTHROID) tablet 112 mcg (112 mcg Oral Given 11/22/20 0555)  divalproex (DEPAKOTE SPRINKLE) capsule 125 mg (125 mg Oral Given 11/22/20 0832)  amLODipine (NORVASC) tablet 10 mg (10 mg Oral Given 11/22/20 0833)  LORazepam (ATIVAN) tablet 0.5 mg (has no administration in time range)  albuterol (VENTOLIN HFA) 108 (90 Base) MCG/ACT inhaler 2 puff (has no administration in time range)  melatonin tablet 3 mg (has no administration in time range)  fluticasone furoate-vilanterol (BREO ELLIPTA) 200-25 MCG/INH 1 puff (1 puff Inhalation Given 11/22/20 0844)  enoxaparin (LOVENOX) injection 40 mg (40 mg Subcutaneous Given 11/21/20 2253)  fentaNYL (SUBLIMAZE) injection 12.5 mcg (has no administration in time range)    Or  fentaNYL (SUBLIMAZE) injection 25 mcg (has no administration in time range)  polyethylene glycol (MIRALAX / GLYCOLAX) packet 17 g (has no administration in time range)  ondansetron (ZOFRAN) injection 4 mg (has no administration in time range)   cefTRIAXone (ROCEPHIN) 2 g in sodium chloride 0.9 % 100 mL IVPB (2 g Intravenous New Bag/Given 11/21/20 2247)  azithromycin (ZITHROMAX) 500 mg in sodium chloride 0.9 % 250 mL IVPB (500 mg Intravenous New Bag/Given 11/21/20 2333)  hydrALAZINE (APRESOLINE) injection 10 mg (has no administration in time range)    ED Course  I have reviewed the triage vital signs and the nursing notes.  Pertinent labs & imaging results that were available during my care of the patient were reviewed by me and considered in my medical decision making (see chart for details).  Clinical Course as of 11/22/20 9798  Sat Nov 21, 2020  1804 Discussed with Dr. Lillia Abed who felt if the x-ray was positive patient should be admitted to hospital service and they will follow along. [MB]  1812 Chest x-ray showing possible left effusion.  Pelvis and right hip x-ray showing subcapital fracture. [MB]  1925 Discussed with the patient's son Herbie [place] who is healthcare power of attorney.  He asked that the orthopedic team have clear communication with him because he is not sure that with her dementia that she would be a good candidate for rehab. [MB]  2002 Discussed with [facility] hospitalist Dr. Cyd Silence who will evaluate the patient for admission. [MB]    Clinical Course User Index [MB] Hayden Rasmussen, MD   MDM Rules/Calculators/A&P                         This patient complains of right hip pain after a fall; this involves an extensive number of treatment Options and is a complaint that carries with it a high risk of complications and Morbidity. The differential includes fracture, dislocation, contusion, head bleed, stroke, metabolic derangement, arrhythmia  I ordered, reviewed and interpreted labs, which included CBC with mildly elevated white count, normal hemoglobin, chemistries normal other than mildly elevated glucose, Covid testing negative I ordered medication IV fluids and pain medication  I ordered imaging  studies which included pelvis and right hip, chest x-ray CT head and cervical spine  and I independently    visualized and interpreted imaging which showed right femoral neck fracture, left pleural effusion versus atelectasis versus infiltrate Additional history obtained from EMS Previous records obtained and reviewed in epic, no recent admissions I consulted Dr. Stann Mainland orthopedics and [facility] hospitalist Dr. Cyd Silence and discussed lab and imaging findings  Critical Interventions: None  After the interventions stated above, I reevaluated the patient and found patient's pain to be controlled.  She will need to be admitted to the hospital for further work-up.  Reviewed work-up with patient's POA, her son.   Final Clinical Impression(s) / ED Diagnoses Final diagnoses:  Fall, initial encounter  Closed right hip fracture, initial encounter [facility])    Rx / DC Orders ED Discharge Orders    None       Hayden Rasmussen, MD 11/22/20 [phone]

## 2020-11-21 NOTE — ED Notes (Signed)
Patient transported to CT 

## 2020-11-21 NOTE — Progress Notes (Signed)
Transition of Care [facility]) - CAGE-AID Screening   Patient Details  Name: [facility] MRN: [id number] Date of Birth: [birth year]   Clinical Narrative:  Patient with advanced dementia, only oriented to name.  CAGE-AID Screening: Substance Abuse Screening unable to be completed due to: : Patient unable to participate (Pt with advance dementia, only oriented to name)

## 2020-11-21 NOTE — ED Notes (Signed)
Pt returned from CT °

## 2020-11-21 NOTE — ED Notes (Signed)
Spoke with pt's son Marieta Markov who is her responsible party per facility paperwork. Call as needed for updates/questions [phone]

## 2020-11-22 ENCOUNTER — Inpatient Hospital Stay (HOSPITAL_COMMUNITY): Payer: Medicare Other

## 2020-11-22 ENCOUNTER — Encounter (HOSPITAL_COMMUNITY): Payer: Self-pay | Admitting: Internal Medicine

## 2020-11-22 DIAGNOSIS — F0151 Vascular dementia with behavioral disturbance: Secondary | ICD-10-CM | POA: Diagnosis not present

## 2020-11-22 DIAGNOSIS — E785 Hyperlipidemia, unspecified: Secondary | ICD-10-CM | POA: Diagnosis present

## 2020-11-22 DIAGNOSIS — W19XXXA Unspecified fall, initial encounter: Secondary | ICD-10-CM

## 2020-11-22 DIAGNOSIS — Z87891 Personal history of nicotine dependence: Secondary | ICD-10-CM | POA: Diagnosis not present

## 2020-11-22 DIAGNOSIS — Z9181 History of falling: Secondary | ICD-10-CM | POA: Diagnosis not present

## 2020-11-22 DIAGNOSIS — E860 Dehydration: Secondary | ICD-10-CM | POA: Diagnosis present

## 2020-11-22 DIAGNOSIS — F0391 Unspecified dementia with behavioral disturbance: Secondary | ICD-10-CM | POA: Diagnosis not present

## 2020-11-22 DIAGNOSIS — S72011A Unspecified intracapsular fracture of right femur, initial encounter for closed fracture: Secondary | ICD-10-CM | POA: Diagnosis present

## 2020-11-22 DIAGNOSIS — K219 Gastro-esophageal reflux disease without esophagitis: Secondary | ICD-10-CM | POA: Diagnosis present

## 2020-11-22 DIAGNOSIS — S72001A Fracture of unspecified part of neck of right femur, initial encounter for closed fracture: Secondary | ICD-10-CM

## 2020-11-22 DIAGNOSIS — Y92009 Unspecified place in unspecified non-institutional (private) residence as the place of occurrence of the external cause: Secondary | ICD-10-CM | POA: Diagnosis not present

## 2020-11-22 DIAGNOSIS — W010XXA Fall on same level from slipping, tripping and stumbling without subsequent striking against object, initial encounter: Secondary | ICD-10-CM | POA: Diagnosis present

## 2020-11-22 DIAGNOSIS — E039 Hypothyroidism, unspecified: Secondary | ICD-10-CM | POA: Diagnosis present

## 2020-11-22 DIAGNOSIS — J449 Chronic obstructive pulmonary disease, unspecified: Secondary | ICD-10-CM | POA: Diagnosis not present

## 2020-11-22 DIAGNOSIS — R296 Repeated falls: Secondary | ICD-10-CM | POA: Diagnosis present

## 2020-11-22 DIAGNOSIS — S92901A Unspecified fracture of right foot, initial encounter for closed fracture: Secondary | ICD-10-CM | POA: Diagnosis present

## 2020-11-22 DIAGNOSIS — Z7951 Long term (current) use of inhaled steroids: Secondary | ICD-10-CM | POA: Diagnosis not present

## 2020-11-22 DIAGNOSIS — Z20822 Contact with and (suspected) exposure to covid-19: Secondary | ICD-10-CM | POA: Diagnosis present

## 2020-11-22 DIAGNOSIS — J189 Pneumonia, unspecified organism: Secondary | ICD-10-CM | POA: Diagnosis present

## 2020-11-22 DIAGNOSIS — Z886 Allergy status to analgesic agent status: Secondary | ICD-10-CM | POA: Diagnosis not present

## 2020-11-22 DIAGNOSIS — Z79899 Other long term (current) drug therapy: Secondary | ICD-10-CM | POA: Diagnosis not present

## 2020-11-22 DIAGNOSIS — J9611 Chronic respiratory failure with hypoxia: Secondary | ICD-10-CM | POA: Diagnosis present

## 2020-11-22 DIAGNOSIS — Z66 Do not resuscitate: Secondary | ICD-10-CM | POA: Diagnosis present

## 2020-11-22 DIAGNOSIS — Z8349 Family history of other endocrine, nutritional and metabolic diseases: Secondary | ICD-10-CM | POA: Diagnosis not present

## 2020-11-22 DIAGNOSIS — J440 Chronic obstructive pulmonary disease with acute lower respiratory infection: Secondary | ICD-10-CM | POA: Diagnosis present

## 2020-11-22 DIAGNOSIS — S72001D Fracture of unspecified part of neck of right femur, subsequent encounter for closed fracture with routine healing: Secondary | ICD-10-CM | POA: Diagnosis not present

## 2020-11-22 DIAGNOSIS — Z9981 Dependence on supplemental oxygen: Secondary | ICD-10-CM | POA: Diagnosis not present

## 2020-11-22 DIAGNOSIS — Z9049 Acquired absence of other specified parts of digestive tract: Secondary | ICD-10-CM | POA: Diagnosis not present

## 2020-11-22 DIAGNOSIS — I10 Essential (primary) hypertension: Secondary | ICD-10-CM | POA: Diagnosis present

## 2020-11-22 DIAGNOSIS — Z881 Allergy status to other antibiotic agents status: Secondary | ICD-10-CM | POA: Diagnosis not present

## 2020-11-22 LAB — BLOOD CULTURE ID PANEL (REFLEXED) - BCID2

## 2020-11-22 LAB — HEMOGLOBIN A1C
Hgb A1c MFr Bld: 5.4 % (ref 4.8–5.6)
Mean Plasma Glucose: 108.28 mg/dL

## 2020-11-22 LAB — PROCALCITONIN: Procalcitonin: <0.1 ng/mL

## 2020-11-22 LAB — MRSA PCR SCREENING: MRSA by PCR: NEGATIVE

## 2020-11-22 LAB — LACTIC ACID, PLASMA: Lactic Acid, Venous: 1.3 mmol/L (ref 0.5–1.9)

## 2020-11-22 LAB — C-REACTIVE PROTEIN: CRP: 4.5 mg/dL — ABNORMAL HIGH (ref <1.0)

## 2020-11-22 LAB — HIV ANTIBODY (ROUTINE TESTING W REFLEX): HIV Screen 4th Generation wRfx: NONREACTIVE

## 2020-11-22 NOTE — Progress Notes (Signed)
PROGRESS NOTE    Dawn Woods  BDZ:[id number] DOB: Sep 18, 1930 DOA: 11/21/2020 PCP: Marletta Lor, MD (Inactive)   Brief Narrative:  87 year old female with past medical history of dementia, COPD, chronic respiratory failure on 3 L of oxygen via nasal cannula, hypothyroidism, hypertension who presents to [facility] emergency department via EMS from [facility] skilled nursing facility status post fall with right hip pain. Patient is an extremely poor historian due to advanced dementia the majority the history is been obtained from discussion with the emergency department staff and review of notes from skilled nursing facility.  Patient explains that earlier today she "tripped on my shoe" resulting in a fall.  Immediately began to complain of severe right hip pain and the right hip x-ray was actually performed at the skilled nursing facility revealing a right hip fracture.  Patient was brought via EMS to [facility] emergency department at the time for evaluation.  For review of skilled nursing facility notes there was no evidence of loss of consciousness or seizure activity precipitating the fall.  No observed head trauma.  Upon evaluation in the emergency department right hip fracture revealed subcapitellar impacted fracture of the right femoral neck with superior displacement.  CT head revealed no active disease.  Chest x-ray however did reveal a left lower lobe infiltrate.  Case was discussed with Dr. Stann Mainland with orthopedic surgery who stated that orthopedics will evaluate the patient in the morning in consultation with tentative planned surgical intervention on Monday by Dr. Lyla Glassing.  The hospitalist group was then called to assist patient with admission to the hospital.  Assessment & Plan:   Principal Problem:   Closed right hip fracture [facility]) Active Problems:   Hypothyroidism   GERD without esophagitis   Dementia with behavioral disturbance (HCC)   Essential  hypertension   Pneumonia of left lower lobe due to infectious organism   Fall at home, initial encounter   COPD (chronic obstructive pulmonary disease) ([place])   Chronic respiratory failure with hypoxia ([place])   Foot fracture, right   Closed right hip fracture ([place]) with profound ambulatory dysfunction -Patient has had multiple falls over the past few months, upwards of 5-6 now with new fracture as noted on imaging  -Orthopedic surgery following, appreciate insight recommendations -Continue limited and low-dose narcotics as necessary for pain control  Questionable pneumonia with acute on chronic hypoxic respiratory failure  -Mild leukocytosis, possibly reactive but left lower lobe opacification on chest x-ray -Continue to titrate oxygen to maintain sats 88 to 92%, baseline oxygen is 3 L -has been weaned down to 3 L this morning without overt hypoxia -Continue ceftriaxone, azithromycin  Hypothyroidism, stable - Continue home regimen of Synthroid  GERD without esophagitis - Continue home regimen of PPI  Essential hypertension - Continue home regimen of antihypertensive therapy including amlodipine  COPD (chronic obstructive pulmonary disease) (HCC) with chronic respiratory failure - No evidence of COPD exacerbation this time - Continue home regimen of maintenance inhalers -On 3 L nasal cannula around-the-clock at home  Dementia with behavioral disturbance - Longstanding history of advanced dementia - Depakote - for behavoiral issues with other patients - Continuing to achieve pain control with as needed analgesics with smallest doses possible to minimize worsening confusion - Fall precautions - Frequent redirection of patient -Patient's daughter is available nearby for visiting if necessary to help reorient patient  Goals of care : Patient assessed at her assisted living facility for skilled care as of July 2021, at that time the  facility did not think the patient would meet  criteria for skilled care but given her multiple falls recently and new fracture family will have facility reevaluate upon discharge back to their care.    DVT prophylaxis: Lovenox Code Status: DNR Family Communication: Care plan discussed at length with son Lissa Merlin) and daughter-in-law over the phone at length, discussed goals, disposition plan and prognosis.  Status is: Inpatient  Dispo: The patient is from: Facility              Anticipated d/c is to: Same              Anticipated d/c date is: 40 to 72 hours pending clinical course and surgery schedule              Patient currently not medically stable for discharge  Consultants:   Orthopedic surgery  Procedures:   Pending ORIF/similar pending orthopedic evaluation  Antimicrobials:  Azithromycin, ceftriaxone x5 days  Subjective: No acute issues or events overnight per staff, patient's review of systems markedly limited given her baseline dementia, oriented to person only.  Objective: Vitals:   11/21/20 2040 11/21/20 2100 11/21/20 2133 11/22/20 0435  BP: (!) 153/88 122/70 (!) 182/84 (!) 155/75  Pulse: 76 76 75 81  Resp: (!) 23 (!) 23 19 18   Temp:   98.4 F (36.9 C) (!) 97.5 F (36.4 C)  TempSrc:   Oral Oral  SpO2: 98% 100% 96% 94%    Intake/Output Summary (Last 24 hours) at 11/22/2020 3557 Last data filed at 11/22/2020 0500 Gross per 24 hour  Intake --  Output 500 ml  Net -500 ml   There were no vitals filed for this visit.  Examination:  General:  Pleasantly resting in bed, No acute distress.  Alert to person only HEENT:  Normocephalic atraumatic.  Sclerae nonicteric, noninjected.  Extraocular movements intact bilaterally. Neck:  Without mass or deformity.  Trachea is midline. Lungs:  Clear to auscultate bilaterally without rhonchi, wheeze, or rales. Heart:  Regular rate and rhythm.  Without murmurs, rubs, or gallops. Abdomen:  Soft, nontender, nondistended.  Without guarding or rebound. Extremities:  Without cyanosis, clubbing, edema, or obvious deformity. Vascular:  Dorsalis pedis and posterior tibial pulses palpable bilaterally. Skin:  Warm and dry, no erythema, no ulcerations.  Data Reviewed: I have personally reviewed following labs and imaging studies  CBC: Recent Labs  Lab 11/21/20 1805  WBC 10.8*  NEUTROABS 9.4*  HGB 13.6  HCT 43.4  MCV 92.9  PLT 322   Basic Metabolic Panel: Recent Labs  Lab 11/21/20 1805  NA 140  K 4.0  CL 103  CO2 26  GLUCOSE 134*  BUN 22  CREATININE 0.66  CALCIUM 8.9   GFR: CrCl cannot be calculated (Unknown ideal weight.). Liver Function Tests: No results for input(s): AST, ALT, ALKPHOS, BILITOT, PROT, ALBUMIN in the last 168 hours. No results for input(s): LIPASE, AMYLASE in the last 168 hours. No results for input(s): AMMONIA in the last 168 hours. Coagulation Profile: Recent Labs  Lab 11/21/20 1805  INR 1.0   Cardiac Enzymes: No results for input(s): CKTOTAL, CKMB, CKMBINDEX, TROPONINI in the last 168 hours. BNP (last 3 results) No results for input(s): PROBNP in the last 8760 hours. HbA1C: Recent Labs    11/22/20 0615  HGBA1C 5.4   CBG: No results for input(s): GLUCAP in the last 168 hours. Lipid Profile: No results for input(s): CHOL, HDL, LDLCALC, TRIG, CHOLHDL, LDLDIRECT in the last 72 hours. Thyroid Function Tests:  No results for input(s): TSH, T4TOTAL, FREET4, T3FREE, THYROIDAB in the last 72 hours. Anemia Panel: No results for input(s): VITAMINB12, FOLATE, FERRITIN, TIBC, IRON, RETICCTPCT in the last 72 hours. Sepsis Labs: Recent Labs  Lab 11/22/20 0615  LATICACIDVEN 1.3    Recent Results (from the past 240 hour(s))  Resp Panel by RT-PCR (Flu A&B, Covid) Nasopharyngeal Swab     Status: None   Collection Time: 11/21/20  6:12 PM   Specimen: Nasopharyngeal Swab; Nasopharyngeal(NP) swabs in vial transport medium  Result Value Ref Range Status   SARS Coronavirus 2 by RT PCR NEGATIVE NEGATIVE Final     Comment: (NOTE) SARS-CoV-2 target nucleic acids are NOT DETECTED.  The SARS-CoV-2 RNA is generally detectable in upper respiratory specimens during the acute phase of infection. The lowest concentration of SARS-CoV-2 viral copies this assay can detect is 138 copies/mL. A negative result does not preclude SARS-Cov-2 infection and should not be used as the sole basis for treatment or other patient management decisions. A negative result may occur with  improper specimen collection/handling, submission of specimen other than nasopharyngeal swab, presence of viral mutation(s) within the areas targeted by this assay, and inadequate number of viral copies(<138 copies/mL). A negative result must be combined with clinical observations, patient history, and epidemiological information. The expected result is Negative.  Fact Sheet for Patients:  EntrepreneurPulse.com.au  Fact Sheet for Healthcare Providers:  IncredibleEmployment.be  This test is no t yet approved or cleared by the [place] FDA and  has been authorized for detection and/or diagnosis of SARS-CoV-2 by FDA under an Emergency Use Authorization (EUA). This EUA will remain  in effect (meaning this test can be used) for the duration of the COVID-19 declaration under Section 564(b)(1) of the Act, 21 U.S.C.section 360bbb-3(b)(1), unless the authorization is terminated  or revoked sooner.       Influenza A by PCR NEGATIVE NEGATIVE Final   Influenza B by PCR NEGATIVE NEGATIVE Final    Comment: (NOTE) The Xpert Xpress SARS-CoV-2/FLU/RSV plus assay is intended as an aid in the diagnosis of influenza from Nasopharyngeal swab specimens and should not be used as a sole basis for treatment. Nasal washings and aspirates are unacceptable for Xpert Xpress SARS-CoV-2/FLU/RSV testing.  Fact Sheet for Patients: EntrepreneurPulse.com.au  Fact Sheet for Healthcare  Providers: IncredibleEmployment.be  This test is not yet approved or cleared by the [place] FDA and has been authorized for detection and/or diagnosis of SARS-CoV-2 by FDA under an Emergency Use Authorization (EUA). This EUA will remain in effect (meaning this test can be used) for the duration of the COVID-19 declaration under Section 564(b)(1) of the Act, 21 U.S.C. section 360bbb-3(b)(1), unless the authorization is terminated or revoked.  Performed at [facility], [address] [place], [place] [address]          Radiology Studies: DG Chest 1 View  Result Date: 11/21/2020 CLINICAL DATA:  Pain post fall. EXAM: CHEST  1 VIEW COMPARISON:  September 24, 2020 FINDINGS: Enlarged cardiac silhouette. Calcific atherosclerotic disease and tortuosity of the aorta. Left costophrenic angle consolidation. Increased interstitial markings. Osseous structures are without acute abnormality. Soft tissues are grossly normal. IMPRESSION: 1. Left costophrenic angle consolidation which may represent pleural effusion, atelectasis or airspace disease. 2. Increased interstitial markings which may be seen with pulmonary edema. Electronically Signed   By: Fidela Salisbury M.D.   On: 11/21/2020 18:03   CT HEAD WO CONTRAST  Result Date: 11/21/2020 CLINICAL DATA:  Neck injury. EXAM: CT  HEAD WITHOUT CONTRAST CT CERVICAL SPINE WITHOUT CONTRAST TECHNIQUE: Multidetector CT imaging of the head and cervical spine was performed following the standard protocol without intravenous contrast. Multiplanar CT image reconstructions of the cervical spine were also generated. COMPARISON:  October 15, 2016. FINDINGS: CT HEAD FINDINGS Brain: Mild chronic ischemic white matter disease is noted. Mild diffuse cortical atrophy is noted. No mass effect or midline shift is noted. Ventricular size is within normal limits. There is no evidence of mass lesion, hemorrhage or acute infarction. Vascular: No  hyperdense vessel or unexpected calcification. Skull: Normal. Negative for fracture or focal lesion. Sinuses/Orbits: No acute finding. Other: None. CT CERVICAL SPINE FINDINGS Alignment: Mild grade 1 anterolisthesis of C4-5 is noted secondary to posterior facet joint hypertrophy. Skull base and vertebrae: No acute fracture. No primary bone lesion or focal pathologic process. Soft tissues and spinal canal: No prevertebral fluid or swelling. No visible canal hematoma. Disc levels: Moderate degenerative disc disease is noted at C3-4 and C5-6. Severe degenerative disc disease is noted at C6-7. Upper chest: Negative. Other: None. IMPRESSION: 1. Mild chronic ischemic white matter disease. Mild diffuse cortical atrophy. No acute intracranial abnormality seen. 2. Multilevel degenerative disc disease. No acute abnormality seen in the cervical spine. Electronically Signed   By: Marijo Conception M.D.   On: 11/21/2020 19:18   CT CERVICAL SPINE WO CONTRAST  Result Date: 11/21/2020 CLINICAL DATA:  Neck injury. EXAM: CT HEAD WITHOUT CONTRAST CT CERVICAL SPINE WITHOUT CONTRAST TECHNIQUE: Multidetector CT imaging of the head and cervical spine was performed following the standard protocol without intravenous contrast. Multiplanar CT image reconstructions of the cervical spine were also generated. COMPARISON:  October 15, 2016. FINDINGS: CT HEAD FINDINGS Brain: Mild chronic ischemic white matter disease is noted. Mild diffuse cortical atrophy is noted. No mass effect or midline shift is noted. Ventricular size is within normal limits. There is no evidence of mass lesion, hemorrhage or acute infarction. Vascular: No hyperdense vessel or unexpected calcification. Skull: Normal. Negative for fracture or focal lesion. Sinuses/Orbits: No acute finding. Other: None. CT CERVICAL SPINE FINDINGS Alignment: Mild grade 1 anterolisthesis of C4-5 is noted secondary to posterior facet joint hypertrophy. Skull base and vertebrae: No acute  fracture. No primary bone lesion or focal pathologic process. Soft tissues and spinal canal: No prevertebral fluid or swelling. No visible canal hematoma. Disc levels: Moderate degenerative disc disease is noted at C3-4 and C5-6. Severe degenerative disc disease is noted at C6-7. Upper chest: Negative. Other: None. IMPRESSION: 1. Mild chronic ischemic white matter disease. Mild diffuse cortical atrophy. No acute intracranial abnormality seen. 2. Multilevel degenerative disc disease. No acute abnormality seen in the cervical spine. Electronically Signed   By: Marijo Conception M.D.   On: 11/21/2020 19:18   DG Hip Unilat With Pelvis 2-3 Views Right  Result Date: 11/21/2020 CLINICAL DATA:  Pain post fall. EXAM: DG HIP (WITH OR WITHOUT PELVIS) 2-3V RIGHT COMPARISON:  None. FINDINGS: Sub capitellar impacted fracture of the right femoral neck with superior displacement of the distal fracture fragment. Femoral head is normally located within the acetabulum. IMPRESSION: Sub capitellar impacted fracture of the right femoral neck with superior displacement of the distal fracture fragment. Electronically Signed   By: Fidela Salisbury M.D.   On: 11/21/2020 18:04        Scheduled Meds: . amLODipine  10 mg Oral q morning  . divalproex  125 mg Oral Q12H  . enoxaparin (LOVENOX) injection  40 mg Subcutaneous Q24H  . escitalopram  15 mg Oral q morning  . fluticasone furoate-vilanterol  1 puff Inhalation Daily  . guaiFENesin  1,200 mg Oral BID  . levothyroxine  112 mcg Oral q morning  . loratadine  10 mg Oral q morning  . pantoprazole  40 mg Oral q morning  . triamcinolone  4 spray Nasal QHS   Continuous Infusions: . azithromycin 500 mg (11/21/20 2333)  . cefTRIAXone (ROCEPHIN)  IV 2 g (11/21/20 2247)     LOS: 0 days   Time spent: 72min  Evangelos Paulino C Kareem Cathey, DO [facility]  If 7PM-7AM, please contact night-coverage [email or web address]  11/22/2020, 7:26 AM

## 2020-11-22 NOTE — Progress Notes (Signed)
PHARMACY - PHYSICIAN COMMUNICATION CRITICAL VALUE ALERT - BLOOD CULTURE IDENTIFICATION (BCID)  Dawn Woods is an 87 y.o. female who presented to [facility] on 11/21/2020   Assessment: 1/4 bottles with strep staph species no resistance on bcid  Name of physician (or Provider) Contacted: Dr Avon Gully  Current antibiotics: CTX Azithro  Changes to prescribed antibiotics recommended:  None  Barth Kirks, PharmD, BCCCP Clinical Pharmacist [phone]  Please check AMION for all [place] numbers  11/22/2020 4:38 PM

## 2020-11-22 NOTE — Consult Note (Signed)
ORTHOPAEDIC CONSULTATION  REQUESTING PHYSICIAN: Little Ishikawa, MD  PCP:  Marletta Lor, MD (Inactive)  Chief Complaint: Right hip pain  HPI: Dawn Woods is a 87 y.o. female who complains of right hip pain secondary to a fall.  Patient has past medical history significant for dementia, COPD, chronic respiratory failure on 3 L of oxygen via nasal cannula, hypothyroidism, and hypertension.  Patient was transported to [facility] emergency department via EMS from a skilled nursing facility status post fall with right hip pain.  The patient is a poor historian due to dementia.  Majority of history obtained from history and physical and emergency department note.  Patient stated that she tripped causing the fall.  She states that she is ambulatory using a walker but also states that she lives at home with her husband as opposed to the skilled nursing facility.  Emergency department evaluation revealed a subcapital impacted fracture of the right femoral neck with superior displacement.  Dr. Stann Mainland with orthopedic surgery was consulted and plans were made with Dr. Lyla Glassing for possible intervention surgically on Monday.  Past Medical History:  Diagnosis Date  . GERD (gastroesophageal reflux disease)   . H/O: hematuria 07/2010  . Hyperlipidemia   . Hypertension   . Hypothyroidism   . PONV (postoperative nausea and vomiting)   . Tobacco abuse 01/04/2012   Past Surgical History:  Procedure Laterality Date  . APPENDECTOMY     age 67  . CHOLECYSTECTOMY     age 83  . DILATION AND CURETTAGE OF UTERUS     multiple's  . ESOPHAGOGASTRODUODENOSCOPY N/A 08/06/2013   Procedure: ESOPHAGOGASTRODUODENOSCOPY (EGD);  Surgeon: Lear Ng, MD;  Location: [facility];  Service: Endoscopy;  Laterality: N/A;  . FRACTURE SURGERY  Dec 2009   left wrist  . OOPHORECTOMY  1954  . TONSILLECTOMY     age 46   Social History   Socioeconomic History  . Marital status:  Widowed    Spouse name: Not on file  . Number of children: 3  . Years of education: Not on file  . Highest education level: Not on file  Occupational History  . Not on file  Tobacco Use  . Smoking status: Former Smoker    Packs/day: 0.50    Years: 63.00    Pack years: 31.50    Types: Cigarettes    Quit date: 08/05/2013    Years since quitting: 7.3  . Smokeless tobacco: Never Used  Substance and Sexual Activity  . Alcohol use: No    Alcohol/week: 0.0 standard drinks  . Drug use: No  . Sexual activity: Not on file  Other Topics Concern  . Not on file  Social History Narrative  . Not on file   Social Determinants of Health   Financial Resource Strain: Not on file  Food Insecurity: Not on file  Transportation Needs: Not on file  Physical Activity: Not on file  Stress: Not on file  Social Connections: Not on file   Family History  Problem Relation Age of Onset  . Thyroid disease Mother   . Dementia Mother   . Thyroid disease Sister 27   Allergies  Allergen Reactions  . Aspirin Other (See Comments)    GI Bleed  . Ibuprofen Other (See Comments)    GI Bleed  . Ciprofloxacin Hives   Prior to Admission medications   Medication Sig Start Date End Date Taking? Authorizing Provider  albuterol (VENTOLIN HFA) 108 (90 Base) MCG/ACT inhaler  Inhale 2 puffs into the lungs every 6 (six) hours as needed for wheezing or shortness of breath.   Yes [provider]  amLODipine (NORVASC) 10 MG tablet Take 10 mg by mouth every morning.   Yes [provider]  budesonide-formoterol (SYMBICORT) 160-4.5 MCG/ACT inhaler Inhale 2 puffs into the lungs 2 (two) times daily.   Yes [provider]  CRANBERRY EXTRACT PO Take 15,000 mg by mouth every morning.   Yes [provider]  divalproex (DEPAKOTE SPRINKLE) 125 MG capsule Take 125 mg by mouth See admin instructions. Take one capsule (125 mg) by mouth twice daily - morning and midday   Yes [provider]  escitalopram (LEXAPRO) 10 MG tablet Take 15 mg by mouth every morning.   Yes [provider]  Guaifenesin (MUCINEX MAXIMUM STRENGTH) 1200 MG TB12 Take 1,200 mg by mouth 2 (two) times daily.   Yes [provider]  levothyroxine (SYNTHROID) 112 MCG tablet Take 112 mcg by mouth every morning.   Yes [provider]  loratadine (CLARITIN) 10 MG tablet Take 10 mg by mouth every morning.   Yes [provider]  LORazepam (ATIVAN) 0.5 MG tablet Take 0.5 mg by mouth 2 (two) times daily as needed for anxiety (agitation).   Yes [provider]  melatonin 3 MG TABS tablet Take 3 mg by mouth at bedtime as needed (insomnia).   Yes [provider]  OXYGEN Inhale 3 L into the lungs See admin instructions. To maintain saturations at or above 90%   Yes [provider]  pantoprazole (PROTONIX) 40 MG tablet TAKE 1 TABLET TWICE A DAY Patient taking differently: Take 40 mg by mouth every morning. 06/24/16  Yes Marletta Lor, MD  Sodium Fluoride (PREVIDENT 5000 PLUS DT) Place 1 application onto teeth See admin instructions. Brush topically on teeth with a toothbrush after evening mouth care   Yes [provider]  triamcinolone (NASACORT) 55 MCG/ACT AERO nasal inhaler Place 4 sprays into the nose at bedtime. 2 sprays into each nostril   Yes [provider]  Vitamin D, Ergocalciferol, (DRISDOL) 50000 units CAPS capsule Take 50,000 Units by mouth every Thursday.   Yes [provider]   DG Chest 1 View  Result Date: 11/21/2020 CLINICAL DATA:  Pain post fall. EXAM: CHEST  1 VIEW COMPARISON:  September 24, 2020 FINDINGS: Enlarged cardiac silhouette. Calcific atherosclerotic disease and tortuosity of the aorta. Left costophrenic angle consolidation. Increased interstitial markings. Osseous structures are without acute abnormality. Soft tissues are grossly normal. IMPRESSION: 1. Left costophrenic angle consolidation which may  represent pleural effusion, atelectasis or airspace disease. 2. Increased interstitial markings which may be seen with pulmonary edema. Electronically Signed   By: Fidela Salisbury M.D.   On: 11/21/2020 18:03   CT HEAD WO CONTRAST  Result Date: 11/21/2020 CLINICAL DATA:  Neck injury. EXAM: CT HEAD WITHOUT CONTRAST CT CERVICAL SPINE WITHOUT CONTRAST TECHNIQUE: Multidetector CT imaging of the head and cervical spine was performed following the standard protocol without intravenous contrast. Multiplanar CT image reconstructions of the cervical spine were also generated. COMPARISON:  October 15, 2016. FINDINGS: CT HEAD FINDINGS Brain: Mild chronic ischemic white matter disease is noted. Mild diffuse cortical atrophy is noted. No mass effect or midline shift is noted. Ventricular size is within normal limits. There is no evidence of mass lesion, hemorrhage or acute infarction. Vascular: No hyperdense vessel or unexpected calcification. Skull: Normal. Negative for fracture or focal lesion. Sinuses/Orbits: No acute finding. Other:  None. CT CERVICAL SPINE FINDINGS Alignment: Mild grade 1 anterolisthesis of C4-5 is noted secondary to posterior facet joint hypertrophy. Skull base and vertebrae: No acute fracture. No primary bone lesion or focal pathologic process. Soft tissues and spinal canal: No prevertebral fluid or swelling. No visible canal hematoma. Disc levels: Moderate degenerative disc disease is noted at C3-4 and C5-6. Severe degenerative disc disease is noted at C6-7. Upper chest: Negative. Other: None. IMPRESSION: 1. Mild chronic ischemic white matter disease. Mild diffuse cortical atrophy. No acute intracranial abnormality seen. 2. Multilevel degenerative disc disease. No acute abnormality seen in the cervical spine. Electronically Signed   By: Marijo Conception M.D.   On: 11/21/2020 19:18   CT CERVICAL SPINE WO CONTRAST  Result Date: 11/21/2020 CLINICAL DATA:  Neck injury. EXAM: CT HEAD WITHOUT CONTRAST  CT CERVICAL SPINE WITHOUT CONTRAST TECHNIQUE: Multidetector CT imaging of the head and cervical spine was performed following the standard protocol without intravenous contrast. Multiplanar CT image reconstructions of the cervical spine were also generated. COMPARISON:  October 15, 2016. FINDINGS: CT HEAD FINDINGS Brain: Mild chronic ischemic white matter disease is noted. Mild diffuse cortical atrophy is noted. No mass effect or midline shift is noted. Ventricular size is within normal limits. There is no evidence of mass lesion, hemorrhage or acute infarction. Vascular: No hyperdense vessel or unexpected calcification. Skull: Normal. Negative for fracture or focal lesion. Sinuses/Orbits: No acute finding. Other: None. CT CERVICAL SPINE FINDINGS Alignment: Mild grade 1 anterolisthesis of C4-5 is noted secondary to posterior facet joint hypertrophy. Skull base and vertebrae: No acute fracture. No primary bone lesion or focal pathologic process. Soft tissues and spinal canal: No prevertebral fluid or swelling. No visible canal hematoma. Disc levels: Moderate degenerative disc disease is noted at C3-4 and C5-6. Severe degenerative disc disease is noted at C6-7. Upper chest: Negative. Other: None. IMPRESSION: 1. Mild chronic ischemic white matter disease. Mild diffuse cortical atrophy. No acute intracranial abnormality seen. 2. Multilevel degenerative disc disease. No acute abnormality seen in the cervical spine. Electronically Signed   By: Marijo Conception M.D.   On: 11/21/2020 19:18   DG Hip Unilat With Pelvis 2-3 Views Right  Result Date: 11/21/2020 CLINICAL DATA:  Pain post fall. EXAM: DG HIP (WITH OR WITHOUT PELVIS) 2-3V RIGHT COMPARISON:  None. FINDINGS: Sub capitellar impacted fracture of the right femoral neck with superior displacement of the distal fracture fragment. Femoral head is normally located within the acetabulum. IMPRESSION: Sub capitellar impacted fracture of the right femoral neck with superior  displacement of the distal fracture fragment. Electronically Signed   By: Fidela Salisbury M.D.   On: 11/21/2020 18:04    Positive ROS: Patient unable to provide ROS due to advanced dementia  Physical Exam: General: Alert, no acute distress Cardiovascular: No pedal edema Respiratory: No cyanosis, no use of accessory musculature GI: No organomegaly, abdomen is soft and non-tender Skin: No lesions in the area of chief complaint Neurologic: Sensation intact distally Psychiatric: Patient is alert to person and event, able to consent with normal mood and affect Lymphatic: No axillary or cervical lymphadenopathy  MUSCULOSKELETAL: Pain with passive ROM of right hip. Right hip tender to palpation. RLE minimally shortened and rotated. Pedal pulses are palpable. Plantar and dorsiflexion intact  Assessment: Subcapitellar impacted fracture of the right femoral neck with superior displacement of the distal fracture fragment  Plan: Right hip fracture: Plan for hemiarthroplasty with Dr. Lyla Glassing on Monday.  Keep patient n.p.o. after midnight Sunday.  Hold chemical  DVT prophylaxis.  Order right knee x-ray to evaluate for other injury.  Pneumonia of left lower lobe, hypothyroidism, GERD, hypertension, COPD, chronic respiratory failure with hypoxia, dementia with behavioral disturbance: Treat per hospitalist recommendations    Dawn Woods, [place] [phone]    11/22/2020 8:25 AM

## 2020-11-22 NOTE — H&P (View-Only) (Signed)
ORTHOPAEDIC CONSULTATION  REQUESTING PHYSICIAN: Little Ishikawa, MD  PCP:  Marletta Lor, MD (Inactive)  Chief Complaint: Right hip pain  HPI: Dawn Woods is a 87 y.o. female who complains of right hip pain secondary to a fall.  Patient has past medical history significant for dementia, COPD, chronic respiratory failure on 3 L of oxygen via nasal cannula, hypothyroidism, and hypertension.  Patient was transported to [facility] emergency department via EMS from a skilled nursing facility status post fall with right hip pain.  The patient is a poor historian due to dementia.  Majority of history obtained from history and physical and emergency department note.  Patient stated that she tripped causing the fall.  She states that she is ambulatory using a walker but also states that she lives at home with her husband as opposed to the skilled nursing facility.  Emergency department evaluation revealed a subcapital impacted fracture of the right femoral neck with superior displacement.  Dr. Stann Mainland with orthopedic surgery was consulted and plans were made with Dr. Lyla Glassing for possible intervention surgically on Monday.  Past Medical History:  Diagnosis Date  . GERD (gastroesophageal reflux disease)   . H/O: hematuria 07/2010  . Hyperlipidemia   . Hypertension   . Hypothyroidism   . PONV (postoperative nausea and vomiting)   . Tobacco abuse 01/04/2012   Past Surgical History:  Procedure Laterality Date  . APPENDECTOMY     age 38  . CHOLECYSTECTOMY     age 12  . DILATION AND CURETTAGE OF UTERUS     multiple's  . ESOPHAGOGASTRODUODENOSCOPY N/A 08/06/2013   Procedure: ESOPHAGOGASTRODUODENOSCOPY (EGD);  Surgeon: Lear Ng, MD;  Location: [facility];  Service: Endoscopy;  Laterality: N/A;  . FRACTURE SURGERY  Dec 2009   left wrist  . OOPHORECTOMY  1954  . TONSILLECTOMY     age 35   Social History   Socioeconomic History  . Marital status:  Widowed    Spouse name: Not on file  . Number of children: 3  . Years of education: Not on file  . Highest education level: Not on file  Occupational History  . Not on file  Tobacco Use  . Smoking status: Former Smoker    Packs/day: 0.50    Years: 63.00    Pack years: 31.50    Types: Cigarettes    Quit date: 08/05/2013    Years since quitting: 7.3  . Smokeless tobacco: Never Used  Substance and Sexual Activity  . Alcohol use: No    Alcohol/week: 0.0 standard drinks  . Drug use: No  . Sexual activity: Not on file  Other Topics Concern  . Not on file  Social History Narrative  . Not on file   Social Determinants of Health   Financial Resource Strain: Not on file  Food Insecurity: Not on file  Transportation Needs: Not on file  Physical Activity: Not on file  Stress: Not on file  Social Connections: Not on file   Family History  Problem Relation Age of Onset  . Thyroid disease Mother   . Dementia Mother   . Thyroid disease Sister 2   Allergies  Allergen Reactions  . Aspirin Other (See Comments)    GI Bleed  . Ibuprofen Other (See Comments)    GI Bleed  . Ciprofloxacin Hives   Prior to Admission medications   Medication Sig Start Date End Date Taking? Authorizing Provider  albuterol (VENTOLIN HFA) 108 (90 Base) MCG/ACT inhaler  Inhale 2 puffs into the lungs every 6 (six) hours as needed for wheezing or shortness of breath.   Yes [provider]  amLODipine (NORVASC) 10 MG tablet Take 10 mg by mouth every morning.   Yes [provider]  budesonide-formoterol (SYMBICORT) 160-4.5 MCG/ACT inhaler Inhale 2 puffs into the lungs 2 (two) times daily.   Yes [provider]  CRANBERRY EXTRACT PO Take 15,000 mg by mouth every morning.   Yes [provider]  divalproex (DEPAKOTE SPRINKLE) 125 MG capsule Take 125 mg by mouth See admin instructions. Take one capsule (125 mg) by mouth twice daily - morning and midday   Yes [provider]  escitalopram (LEXAPRO) 10 MG tablet Take 15 mg by mouth every morning.   Yes [provider]  Guaifenesin (MUCINEX MAXIMUM STRENGTH) 1200 MG TB12 Take 1,200 mg by mouth 2 (two) times daily.   Yes [provider]  levothyroxine (SYNTHROID) 112 MCG tablet Take 112 mcg by mouth every morning.   Yes [provider]  loratadine (CLARITIN) 10 MG tablet Take 10 mg by mouth every morning.   Yes [provider]  LORazepam (ATIVAN) 0.5 MG tablet Take 0.5 mg by mouth 2 (two) times daily as needed for anxiety (agitation).   Yes [provider]  melatonin 3 MG TABS tablet Take 3 mg by mouth at bedtime as needed (insomnia).   Yes [provider]  OXYGEN Inhale 3 L into the lungs See admin instructions. To maintain saturations at or above 90%   Yes [provider]  pantoprazole (PROTONIX) 40 MG tablet TAKE 1 TABLET TWICE A DAY Patient taking differently: Take 40 mg by mouth every morning. 06/24/16  Yes Marletta Lor, MD  Sodium Fluoride (PREVIDENT 5000 PLUS DT) Place 1 application onto teeth See admin instructions. Brush topically on teeth with a toothbrush after evening mouth care   Yes [provider]  triamcinolone (NASACORT) 55 MCG/ACT AERO nasal inhaler Place 4 sprays into the nose at bedtime. 2 sprays into each nostril   Yes [provider]  Vitamin D, Ergocalciferol, (DRISDOL) 50000 units CAPS capsule Take 50,000 Units by mouth every Thursday.   Yes [provider]   DG Chest 1 View  Result Date: 11/21/2020 CLINICAL DATA:  Pain post fall. EXAM: CHEST  1 VIEW COMPARISON:  September 24, 2020 FINDINGS: Enlarged cardiac silhouette. Calcific atherosclerotic disease and tortuosity of the aorta. Left costophrenic angle consolidation. Increased interstitial markings. Osseous structures are without acute abnormality. Soft tissues are grossly normal. IMPRESSION: 1. Left costophrenic angle consolidation which may  represent pleural effusion, atelectasis or airspace disease. 2. Increased interstitial markings which may be seen with pulmonary edema. Electronically Signed   By: Fidela Salisbury M.D.   On: 11/21/2020 18:03   CT HEAD WO CONTRAST  Result Date: 11/21/2020 CLINICAL DATA:  Neck injury. EXAM: CT HEAD WITHOUT CONTRAST CT CERVICAL SPINE WITHOUT CONTRAST TECHNIQUE: Multidetector CT imaging of the head and cervical spine was performed following the standard protocol without intravenous contrast. Multiplanar CT image reconstructions of the cervical spine were also generated. COMPARISON:  October 15, 2016. FINDINGS: CT HEAD FINDINGS Brain: Mild chronic ischemic white matter disease is noted. Mild diffuse cortical atrophy is noted. No mass effect or midline shift is noted. Ventricular size is within normal limits. There is no evidence of mass lesion, hemorrhage or acute infarction. Vascular: No hyperdense vessel or unexpected calcification. Skull: Normal. Negative for fracture or focal lesion. Sinuses/Orbits: No acute finding. Other:  None. CT CERVICAL SPINE FINDINGS Alignment: Mild grade 1 anterolisthesis of C4-5 is noted secondary to posterior facet joint hypertrophy. Skull base and vertebrae: No acute fracture. No primary bone lesion or focal pathologic process. Soft tissues and spinal canal: No prevertebral fluid or swelling. No visible canal hematoma. Disc levels: Moderate degenerative disc disease is noted at C3-4 and C5-6. Severe degenerative disc disease is noted at C6-7. Upper chest: Negative. Other: None. IMPRESSION: 1. Mild chronic ischemic white matter disease. Mild diffuse cortical atrophy. No acute intracranial abnormality seen. 2. Multilevel degenerative disc disease. No acute abnormality seen in the cervical spine. Electronically Signed   By: Marijo Conception M.D.   On: 11/21/2020 19:18   CT CERVICAL SPINE WO CONTRAST  Result Date: 11/21/2020 CLINICAL DATA:  Neck injury. EXAM: CT HEAD WITHOUT CONTRAST  CT CERVICAL SPINE WITHOUT CONTRAST TECHNIQUE: Multidetector CT imaging of the head and cervical spine was performed following the standard protocol without intravenous contrast. Multiplanar CT image reconstructions of the cervical spine were also generated. COMPARISON:  October 15, 2016. FINDINGS: CT HEAD FINDINGS Brain: Mild chronic ischemic white matter disease is noted. Mild diffuse cortical atrophy is noted. No mass effect or midline shift is noted. Ventricular size is within normal limits. There is no evidence of mass lesion, hemorrhage or acute infarction. Vascular: No hyperdense vessel or unexpected calcification. Skull: Normal. Negative for fracture or focal lesion. Sinuses/Orbits: No acute finding. Other: None. CT CERVICAL SPINE FINDINGS Alignment: Mild grade 1 anterolisthesis of C4-5 is noted secondary to posterior facet joint hypertrophy. Skull base and vertebrae: No acute fracture. No primary bone lesion or focal pathologic process. Soft tissues and spinal canal: No prevertebral fluid or swelling. No visible canal hematoma. Disc levels: Moderate degenerative disc disease is noted at C3-4 and C5-6. Severe degenerative disc disease is noted at C6-7. Upper chest: Negative. Other: None. IMPRESSION: 1. Mild chronic ischemic white matter disease. Mild diffuse cortical atrophy. No acute intracranial abnormality seen. 2. Multilevel degenerative disc disease. No acute abnormality seen in the cervical spine. Electronically Signed   By: Marijo Conception M.D.   On: 11/21/2020 19:18   DG Hip Unilat With Pelvis 2-3 Views Right  Result Date: 11/21/2020 CLINICAL DATA:  Pain post fall. EXAM: DG HIP (WITH OR WITHOUT PELVIS) 2-3V RIGHT COMPARISON:  None. FINDINGS: Sub capitellar impacted fracture of the right femoral neck with superior displacement of the distal fracture fragment. Femoral head is normally located within the acetabulum. IMPRESSION: Sub capitellar impacted fracture of the right femoral neck with superior  displacement of the distal fracture fragment. Electronically Signed   By: Fidela Salisbury M.D.   On: 11/21/2020 18:04    Positive ROS: Patient unable to provide ROS due to advanced dementia  Physical Exam: General: Alert, no acute distress Cardiovascular: No pedal edema Respiratory: No cyanosis, no use of accessory musculature GI: No organomegaly, abdomen is soft and non-tender Skin: No lesions in the area of chief complaint Neurologic: Sensation intact distally Psychiatric: Patient is alert to person and event, able to consent with normal mood and affect Lymphatic: No axillary or cervical lymphadenopathy  MUSCULOSKELETAL: Pain with passive ROM of right hip. Right hip tender to palpation. RLE minimally shortened and rotated. Pedal pulses are palpable. Plantar and dorsiflexion intact  Assessment: Subcapitellar impacted fracture of the right femoral neck with superior displacement of the distal fracture fragment  Plan: Right hip fracture: Plan for hemiarthroplasty with Dr. Lyla Glassing on Monday.  Keep patient n.p.o. after midnight Sunday.  Hold chemical  DVT prophylaxis.  Order right knee x-ray to evaluate for other injury.  Pneumonia of left lower lobe, hypothyroidism, GERD, hypertension, COPD, chronic respiratory failure with hypoxia, dementia with behavioral disturbance: Treat per hospitalist recommendations    Dorothyann Peng, [place] [phone]    11/22/2020 8:25 AM

## 2020-11-22 NOTE — Plan of Care (Signed)
Advanced dementia

## 2020-11-23 ENCOUNTER — Inpatient Hospital Stay (HOSPITAL_COMMUNITY): Payer: Medicare Other | Admitting: Anesthesiology

## 2020-11-23 ENCOUNTER — Inpatient Hospital Stay (HOSPITAL_COMMUNITY): Payer: Medicare Other

## 2020-11-23 ENCOUNTER — Encounter (HOSPITAL_COMMUNITY)
Admission: EM | Disposition: A | Discharge status: Skilled Nursing Facility | Payer: Self-pay | Source: Home / Self Care | Attending: Internal Medicine

## 2020-11-23 ENCOUNTER — Encounter (HOSPITAL_COMMUNITY): Payer: Self-pay | Admitting: Internal Medicine

## 2020-11-23 HISTORY — PX: CYSTOSCOPY: SHX5120

## 2020-11-23 HISTORY — PX: ANTERIOR APPROACH HEMI HIP ARTHROPLASTY: SHX6690

## 2020-11-23 LAB — CBC
HCT: 42.7 % (ref 36.0–46.0)
Hemoglobin: 13.8 g/dL (ref 12.0–15.0)
MCH: 29.6 pg (ref 26.0–34.0)
MCHC: 32.3 g/dL (ref 30.0–36.0)
MCV: 91.6 fL (ref 80.0–100.0)
Platelets: 199 10*3/uL (ref 150–400)
RBC: 4.66 MIL/uL (ref 3.87–5.11)
RDW: 13.5 % (ref 11.5–15.5)
WBC: 9.2 10*3/uL (ref 4.0–10.5)
nRBC: 0 % (ref 0.0–0.2)

## 2020-11-23 LAB — BASIC METABOLIC PANEL
Anion gap: 11 (ref 5–15)
BUN: 13 mg/dL (ref 8–23)
CO2: 26 mmol/L (ref 22–32)
Calcium: 8.8 mg/dL — ABNORMAL LOW (ref 8.9–10.3)
Chloride: 103 mmol/L (ref 98–111)
Creatinine, Ser: 0.6 mg/dL (ref 0.44–1.00)
GFR, Estimated: >60 mL/min (ref >60)
Glucose, Bld: 99 mg/dL (ref 70–99)
Potassium: 3.6 mmol/L (ref 3.5–5.1)
Sodium: 140 mmol/L (ref 135–145)

## 2020-11-23 SURGERY — HEMIARTHROPLASTY, HIP, DIRECT ANTERIOR APPROACH, FOR FRACTURE
Anesthesia: General | Laterality: Right

## 2020-11-23 MED ORDER — ONDANSETRON HCL 4 MG PO TABS: 4.0000 mg | ORAL_TABLET | Freq: Four times a day (QID) | ORAL | Status: DC | PRN

## 2020-11-23 MED ORDER — BUPIVACAINE HCL (PF) 0.5 % IJ SOLN
INTRAMUSCULAR | Status: AC
  Filled 2020-11-23: qty 30

## 2020-11-23 MED ORDER — BUPIVACAINE-EPINEPHRINE (PF) 0.5% -1:200000 IJ SOLN
INTRAMUSCULAR | Status: DC | PRN
  Administered 2020-11-23: 30 mL via PERINEURAL

## 2020-11-23 MED ORDER — AMISULPRIDE (ANTIEMETIC) 5 MG/2ML IV SOLN: 10.0000 mg | Freq: Once | INTRAVENOUS | Status: DC | PRN

## 2020-11-23 MED ORDER — CEFAZOLIN SODIUM-DEXTROSE 2-4 GM/100ML-% IV SOLN
2.0000 g | INTRAVENOUS | Status: AC
  Administered 2020-11-23: 2 g via INTRAVENOUS
  Filled 2020-11-23: qty 100

## 2020-11-23 MED ORDER — POVIDONE-IODINE 10 % EX SWAB: 2.0000 "application " | Freq: Once | CUTANEOUS | Status: DC

## 2020-11-23 MED ORDER — DEXTROSE 5 % IV SOLN: 3.0000 g | INTRAVENOUS | Status: DC

## 2020-11-23 MED ORDER — EPINEPHRINE PF 1 MG/ML IJ SOLN
INTRAMUSCULAR | Status: AC
  Filled 2020-11-23: qty 1

## 2020-11-23 MED ORDER — TRANEXAMIC ACID-NACL 1000-0.7 MG/100ML-% IV SOLN
INTRAVENOUS | Status: AC
  Filled 2020-11-23: qty 100

## 2020-11-23 MED ORDER — FENTANYL CITRATE (PF) 250 MCG/5ML IJ SOLN
INTRAMUSCULAR | Status: AC
  Filled 2020-11-23: qty 5

## 2020-11-23 MED ORDER — LABETALOL HCL 5 MG/ML IV SOLN
INTRAVENOUS | Status: AC
  Filled 2020-11-23: qty 4

## 2020-11-23 MED ORDER — CHLORHEXIDINE GLUCONATE 0.12 % MT SOLN: 15.0000 mL | Freq: Once | OROMUCOSAL | Status: AC

## 2020-11-23 MED ORDER — KETOROLAC TROMETHAMINE 30 MG/ML IJ SOLN
INTRAMUSCULAR | Status: DC | PRN
  Administered 2020-11-23: 30 mg via INTRAVENOUS

## 2020-11-23 MED ORDER — METOCLOPRAMIDE HCL 5 MG PO TABS: 5.0000 mg | ORAL_TABLET | Freq: Three times a day (TID) | ORAL | Status: DC | PRN

## 2020-11-23 MED ORDER — 0.9 % SODIUM CHLORIDE (POUR BTL) OPTIME
TOPICAL | Status: DC | PRN
  Administered 2020-11-23: 1000 mL

## 2020-11-23 MED ORDER — LACTATED RINGERS IV SOLN: INTRAVENOUS | Status: DC

## 2020-11-23 MED ORDER — SODIUM CHLORIDE (PF) 0.9 % IJ SOLN
INTRAMUSCULAR | Status: DC | PRN
  Administered 2020-11-23: 20 mL via INTRAVENOUS

## 2020-11-23 MED ORDER — ENSURE ENLIVE PO LIQD
237.0000 mL | Freq: Two times a day (BID) | ORAL | Status: DC
  Administered 2020-11-24 – 2020-11-26 (×6): 237 mL via ORAL

## 2020-11-23 MED ORDER — ENOXAPARIN SODIUM 40 MG/0.4ML ~~LOC~~ SOLN
40.0000 mg | SUBCUTANEOUS | Status: DC
  Administered 2020-11-24 – 2020-11-26 (×3): 40 mg via SUBCUTANEOUS
  Filled 2020-11-23 (×3): qty 0.4

## 2020-11-23 MED ORDER — DEXAMETHASONE SODIUM PHOSPHATE 10 MG/ML IJ SOLN
INTRAMUSCULAR | Status: AC
  Filled 2020-11-23: qty 1

## 2020-11-23 MED ORDER — ONDANSETRON HCL 4 MG/2ML IJ SOLN: 4.0000 mg | Freq: Four times a day (QID) | INTRAMUSCULAR | Status: DC | PRN

## 2020-11-23 MED ORDER — CHLORHEXIDINE GLUCONATE 4 % EX LIQD: 60.0000 mL | Freq: Once | CUTANEOUS | Status: DC

## 2020-11-23 MED ORDER — MENTHOL 3 MG MT LOZG: 1.0000 | LOZENGE | OROMUCOSAL | Status: DC | PRN

## 2020-11-23 MED ORDER — ADULT MULTIVITAMIN W/MINERALS CH
1.0000 | ORAL_TABLET | Freq: Every day | ORAL | Status: DC
  Administered 2020-11-24: 1 via ORAL
  Filled 2020-11-23: qty 1

## 2020-11-23 MED ORDER — FENTANYL CITRATE (PF) 100 MCG/2ML IJ SOLN: 25.0000 ug | INTRAMUSCULAR | Status: DC | PRN

## 2020-11-23 MED ORDER — CEFAZOLIN SODIUM-DEXTROSE 2-4 GM/100ML-% IV SOLN
2.0000 g | Freq: Four times a day (QID) | INTRAVENOUS | Status: AC
  Administered 2020-11-23 (×2): 2 g via INTRAVENOUS
  Filled 2020-11-23 (×2): qty 100

## 2020-11-23 MED ORDER — FENTANYL CITRATE (PF) 100 MCG/2ML IJ SOLN
INTRAMUSCULAR | Status: DC | PRN
  Administered 2020-11-23 (×3): 50 ug via INTRAVENOUS

## 2020-11-23 MED ORDER — CHLORHEXIDINE GLUCONATE 0.12 % MT SOLN
OROMUCOSAL | Status: AC
  Administered 2020-11-23: 15 mL via OROMUCOSAL
  Filled 2020-11-23: qty 15

## 2020-11-23 MED ORDER — SODIUM CHLORIDE 0.9 % IV SOLN
INTRAVENOUS | Status: AC | PRN
  Administered 2020-11-23: 1000 mL

## 2020-11-23 MED ORDER — ONDANSETRON HCL 4 MG/2ML IJ SOLN
INTRAMUSCULAR | Status: DC | PRN
  Administered 2020-11-23: 4 mg via INTRAVENOUS

## 2020-11-23 MED ORDER — KETOROLAC TROMETHAMINE 30 MG/ML IJ SOLN
INTRAMUSCULAR | Status: AC
  Filled 2020-11-23: qty 1

## 2020-11-23 MED ORDER — LIDOCAINE 2% (20 MG/ML) 5 ML SYRINGE
INTRAMUSCULAR | Status: DC | PRN
  Administered 2020-11-23: 20 mg via INTRAVENOUS

## 2020-11-23 MED ORDER — PROPOFOL 10 MG/ML IV BOLUS
INTRAVENOUS | Status: DC | PRN
  Administered 2020-11-23: 80 mg via INTRAVENOUS

## 2020-11-23 MED ORDER — TRANEXAMIC ACID-NACL 1000-0.7 MG/100ML-% IV SOLN
1000.0000 mg | INTRAVENOUS | Status: AC
  Administered 2020-11-23: 1000 mg via INTRAVENOUS

## 2020-11-23 MED ORDER — ONDANSETRON HCL 4 MG/2ML IJ SOLN
INTRAMUSCULAR | Status: AC
  Filled 2020-11-23: qty 2

## 2020-11-23 MED ORDER — DEXAMETHASONE SODIUM PHOSPHATE 10 MG/ML IJ SOLN
INTRAMUSCULAR | Status: DC | PRN
  Administered 2020-11-23: 4 mg via INTRAVENOUS

## 2020-11-23 MED ORDER — METOCLOPRAMIDE HCL 5 MG/ML IJ SOLN: 5.0000 mg | Freq: Three times a day (TID) | INTRAMUSCULAR | Status: DC | PRN

## 2020-11-23 MED ORDER — SODIUM CHLORIDE 0.9 % IR SOLN
Status: DC | PRN
  Administered 2020-11-23: 3000 mL

## 2020-11-23 MED ORDER — PHENOL 1.4 % MT LIQD: 1.0000 | OROMUCOSAL | Status: DC | PRN

## 2020-11-23 MED ORDER — SUGAMMADEX SODIUM 200 MG/2ML IV SOLN
INTRAVENOUS | Status: DC | PRN
  Administered 2020-11-23: 200 mg via INTRAVENOUS

## 2020-11-23 MED ORDER — LACTATED RINGERS IV SOLN: INTRAVENOUS | Status: DC | PRN

## 2020-11-23 MED ORDER — ROCURONIUM BROMIDE 100 MG/10ML IV SOLN
INTRAVENOUS | Status: DC | PRN
  Administered 2020-11-23: 60 mg via INTRAVENOUS

## 2020-11-23 MED ORDER — DOCUSATE SODIUM 100 MG PO CAPS
100.0000 mg | ORAL_CAPSULE | Freq: Two times a day (BID) | ORAL | Status: DC
  Administered 2020-11-24 – 2020-11-26 (×5): 100 mg via ORAL
  Filled 2020-11-23 (×5): qty 1

## 2020-11-23 SURGICAL SUPPLY — 66 items
BLADE CLIPPER SURG (BLADE) IMPLANT
BLADE SAW SGTL 18X1.27X75 (BLADE) ×3 IMPLANT
CHLORAPREP W/TINT 26 (MISCELLANEOUS) ×3 IMPLANT
COVER SURGICAL LIGHT HANDLE (MISCELLANEOUS) ×3 IMPLANT
COVER WAND RF STERILE (DRAPES) ×3 IMPLANT
DERMABOND ADHESIVE PROPEN (GAUZE/BANDAGES/DRESSINGS) ×1
DERMABOND ADVANCED (GAUZE/BANDAGES/DRESSINGS) ×2
DERMABOND ADVANCED .7 DNX12 (GAUZE/BANDAGES/DRESSINGS) ×4 IMPLANT
DERMABOND ADVANCED .7 DNX6 (GAUZE/BANDAGES/DRESSINGS) IMPLANT
DRAPE C-ARM 42X72 X-RAY (DRAPES) ×3 IMPLANT
DRAPE IMP U-DRAPE 54X76 (DRAPES) ×6 IMPLANT
DRAPE STERI IOBAN 125X83 (DRAPES) ×3 IMPLANT
DRAPE U-SHAPE 47X51 STRL (DRAPES) ×9 IMPLANT
DRSG AQUACEL AG ADV 3.5X10 (GAUZE/BANDAGES/DRESSINGS) ×3 IMPLANT
ELECT BLADE 4.0 EZ CLEAN MEGAD (MISCELLANEOUS) ×3
ELECT REM PT RETURN 9FT ADLT (ELECTROSURGICAL) ×3
ELECTRODE BLDE 4.0 EZ CLN MEGD (MISCELLANEOUS) ×2 IMPLANT
ELECTRODE REM PT RTRN 9FT ADLT (ELECTROSURGICAL) ×2 IMPLANT
EVACUATOR 1/8 PVC DRAIN (DRAIN) IMPLANT
GLOVE BIO SURGEON STRL SZ8.5 (GLOVE) ×6 IMPLANT
GLOVE BIOGEL M 7.0 STRL (GLOVE) ×3 IMPLANT
GLOVE BIOGEL PI IND STRL 7.5 (GLOVE) ×2 IMPLANT
GLOVE BIOGEL PI IND STRL 8.5 (GLOVE) ×2 IMPLANT
GLOVE BIOGEL PI INDICATOR 7.5 (GLOVE) ×1
GLOVE BIOGEL PI INDICATOR 8.5 (GLOVE) ×1
GOWN STRL REUS W/ TWL LRG LVL3 (GOWN DISPOSABLE) ×4 IMPLANT
GOWN STRL REUS W/ TWL XL LVL3 (GOWN DISPOSABLE) ×2 IMPLANT
GOWN STRL REUS W/TWL 2XL LVL3 (GOWN DISPOSABLE) ×3 IMPLANT
GOWN STRL REUS W/TWL LRG LVL3 (GOWN DISPOSABLE) ×4
GOWN STRL REUS W/TWL XL LVL3 (GOWN DISPOSABLE) ×3
HANDPIECE INTERPULSE COAX TIP (DISPOSABLE) ×2
HEAD FEM UNIPOLAR 47 OD STRL (Hips) ×1 IMPLANT
HOOD PEEL AWAY FACE SHEILD DIS (HOOD) ×6 IMPLANT
KIT BASIN OR (CUSTOM PROCEDURE TRAY) ×3 IMPLANT
KIT TURNOVER KIT B (KITS) ×3 IMPLANT
MANIFOLD NEPTUNE II (INSTRUMENTS) ×3 IMPLANT
MARKER SKIN DUAL TIP RULER LAB (MISCELLANEOUS) ×3 IMPLANT
NDL SPNL 18GX3.5 QUINCKE PK (NEEDLE) ×2 IMPLANT
NEEDLE SPNL 18GX3.5 QUINCKE PK (NEEDLE) ×3 IMPLANT
NS IRRIG 1000ML POUR BTL (IV SOLUTION) ×3 IMPLANT
PACK TOTAL JOINT (CUSTOM PROCEDURE TRAY) ×3 IMPLANT
PACK UNIVERSAL I (CUSTOM PROCEDURE TRAY) ×3 IMPLANT
PAD ARMBOARD 7.5X6 YLW CONV (MISCELLANEOUS) ×6 IMPLANT
SAW OSC TIP CART 19.5X105X1.3 (SAW) ×1 IMPLANT
SEALER BIPOLAR AQUA 6.0 (INSTRUMENTS) IMPLANT
SET HNDPC FAN SPRY TIP SCT (DISPOSABLE) ×2 IMPLANT
SPACER DEPUY (Hips) ×1 IMPLANT
STAPLER VISISTAT 35W (STAPLE) ×1 IMPLANT
STEM TRI LOC BPS SZ7 W GRIPTON (Hips) IMPLANT
SUCTION FRAZIER HANDLE 10FR (MISCELLANEOUS) ×2
SUCTION TUBE FRAZIER 10FR DISP (MISCELLANEOUS) ×2 IMPLANT
SUT ETHIBOND NAB CT1 #1 30IN (SUTURE) ×6 IMPLANT
SUT MNCRL AB 3-0 PS2 18 (SUTURE) ×3 IMPLANT
SUT MON AB 2-0 CT1 36 (SUTURE) ×3 IMPLANT
SUT VIC AB 1 CT1 27 (SUTURE) ×3
SUT VIC AB 1 CT1 27XBRD ANBCTR (SUTURE) ×2 IMPLANT
SUT VIC AB 2-0 CT1 27 (SUTURE) ×3
SUT VIC AB 2-0 CT1 TAPERPNT 27 (SUTURE) ×2 IMPLANT
SUT VLOC 180 0 24IN GS25 (SUTURE) ×3 IMPLANT
SYR 50ML LL SCALE MARK (SYRINGE) ×3 IMPLANT
TOWEL GREEN STERILE (TOWEL DISPOSABLE) ×3 IMPLANT
TOWEL GREEN STERILE FF (TOWEL DISPOSABLE) ×3 IMPLANT
TRAY FOLEY W/BAG SLVR 16FR (SET/KITS/TRAYS/PACK) ×2
TRAY FOLEY W/BAG SLVR 16FR ST (SET/KITS/TRAYS/PACK) IMPLANT
TRI LOC BPS SZ 7 W GRIPTON (Hips) ×3 IMPLANT
WATER STERILE IRR 1000ML POUR (IV SOLUTION) ×9 IMPLANT

## 2020-11-23 NOTE — Interval H&P Note (Signed)
History and Physical Interval Note:  11/23/2020 11:40 AM  Dawn Woods  has presented today for surgery, with the diagnosis of RIGHT HIP FRACTURE.  The various methods of treatment have been discussed with the patient and family (daughter and son - POA). After consideration of risks, benefits and other options for treatment, the patient has consented to  Procedure(s): ANTERIOR APPROACH HEMI HIP ARTHROPLASTY (Right) as a surgical intervention.  The patient's history has been reviewed, patient examined, no change in status, stable for surgery.  I have reviewed the patient's chart and labs.  Questions were answered to the patient and family's satisfaction.    The risks, benefits, and alternatives were discussed with the patient. There are risks associated with the surgery including, but not limited to, problems with anesthesia (death), infection, instability (giving out of the joint), dislocation, differences in leg length/angulation/rotation, fracture of bones, loosening or failure of implants, hematoma (blood accumulation) which may require surgical drainage, blood clots, pulmonary embolism, nerve injury (foot drop and lateral thigh numbness), and blood vessel injury. The patient understands these risks and elects to proceed.    Hilton Cork Monzerat Handler

## 2020-11-23 NOTE — Anesthesia Procedure Notes (Addendum)
Procedure Name: Intubation Performed by: Jenne Campus, CRNA Pre-anesthesia Checklist: Patient identified, Emergency Drugs available, Suction available and Patient being monitored Patient Re-evaluated:Patient Re-evaluated prior to induction Oxygen Delivery Method: Circle system utilized Preoxygenation: Pre-oxygenation with 100% oxygen Induction Type: IV induction Ventilation: Mask ventilation without difficulty and Oral airway inserted - appropriate to patient size Laryngoscope Size: Sabra Heck and 2 Grade View: Grade I Tube type: Oral Number of attempts: 1 Airway Equipment and Method: Stylet and Oral airway Placement Confirmation: ETT inserted through vocal cords under direct vision,  positive ETCO2 and breath sounds checked- equal and bilateral Secured at: 21 cm Tube secured with: Tape Dental Injury: Teeth and Oropharynx as per pre-operative assessment

## 2020-11-23 NOTE — Consult Note (Addendum)
Subjective: CC: Difficult foley placement.    Consult requested by Dr. Rod Can.  The patient is an 87yo female who is in the OR for a right hip ORIF and attempt at foley placement was unsuccessful.  No GU history is documented.  ROS:  Review of Systems  Unable to perform ROS: Intubated    Allergies  Allergen Reactions  . Aspirin Other (See Comments)    GI Bleed  . Ibuprofen Other (See Comments)    GI Bleed  . Ciprofloxacin Hives    Past Medical History:  Diagnosis Date  . GERD (gastroesophageal reflux disease)   . H/O: hematuria 07/2010  . Hyperlipidemia   . Hypertension   . Hypothyroidism   . PONV (postoperative nausea and vomiting)   . Tobacco abuse 01/04/2012    Past Surgical History:  Procedure Laterality Date  . APPENDECTOMY     age 25  . CHOLECYSTECTOMY     age 78  . DILATION AND CURETTAGE OF UTERUS     multiple's  . ESOPHAGOGASTRODUODENOSCOPY N/A 08/06/2013   Procedure: ESOPHAGOGASTRODUODENOSCOPY (EGD);  Surgeon: Lear Ng, MD;  Location: [facility];  Service: Endoscopy;  Laterality: N/A;  . FRACTURE SURGERY  Dec 2009   left wrist  . OOPHORECTOMY  1954  . TONSILLECTOMY     age 24    Social History   Socioeconomic History  . Marital status: Widowed    Spouse name: Not on file  . Number of children: 3  . Years of education: Not on file  . Highest education level: Not on file  Occupational History  . Not on file  Tobacco Use  . Smoking status: Former Smoker    Packs/day: 0.50    Years: 63.00    Pack years: 31.50    Types: Cigarettes    Quit date: 08/05/2013    Years since quitting: 7.3  . Smokeless tobacco: Never Used  Substance and Sexual Activity  . Alcohol use: No    Alcohol/week: 0.0 standard drinks  . Drug use: No  . Sexual activity: Not on file  Other Topics Concern  . Not on file  Social History Narrative  . Not on file   Social Determinants of Health   Financial Resource Strain: Not on file  Food  Insecurity: Not on file  Transportation Needs: Not on file  Physical Activity: Not on file  Stress: Not on file  Social Connections: Not on file  Intimate Partner Violence: Not on file    Family History  Problem Relation Age of Onset  . Thyroid disease Mother   . Dementia Mother   . Thyroid disease Sister 41    Anti-infectives: Anti-infectives (From admission, onward)   Start     Dose/Rate Route Frequency Ordered Stop   11/23/20 0800  ceFAZolin (ANCEF) IVPB 2g/100 mL premix        2 g 200 mL/hr over 30 Minutes Intravenous On call to O.R. 11/23/20 0710 11/23/20 1256   11/23/20 0800  ceFAZolin (ANCEF) 3 g in dextrose 5 % 50 mL IVPB  Status:  Discontinued        3 g 100 mL/hr over 30 Minutes Intravenous On call to O.R. 11/23/20 0710 11/23/20 0712   11/21/20 2100  [MAR Hold]  cefTRIAXone (ROCEPHIN) 2 g in sodium chloride 0.9 % 100 mL IVPB        (MAR Hold since Mon 11/23/2020 at 1052.Hold Reason: Transfer to a Procedural area.)   2 g 200 mL/hr over 30 Minutes Intravenous  Every 24 hours 11/21/20 2055 11/26/20 2059   11/21/20 2100  [MAR Hold]  azithromycin (ZITHROMAX) 500 mg in sodium chloride 0.9 % 250 mL IVPB        (MAR Hold since Mon 11/23/2020 at 1052.Hold Reason: Transfer to a Procedural area.)   500 mg 250 mL/hr over 60 Minutes Intravenous Every 24 hours 11/21/20 2055 11/26/20 2059      Current Facility-Administered Medications  Medication Dose Route Frequency Provider Last Rate Last Admin  . 0.9 %  sodium chloride infusion    Continuous PRN Rod Can, MD 150 mL/hr at 11/23/20 1333 1,000 mL at 11/23/20 1333  . 0.9 % irrigation (POUR BTL)    PRN Rod Can, MD   1,000 mL at 11/23/20 1333  . [MAR Hold] albuterol (VENTOLIN HFA) 108 (90 Base) MCG/ACT inhaler 2 puff  2 puff Inhalation Q6H PRN Shalhoub, Sherryll Burger, MD      . Doug Sou Hold] amLODipine (NORVASC) tablet 10 mg  10 mg Oral q morning Shalhoub, Sherryll Burger, MD   10 mg at 11/23/20 0902  . [MAR Hold] azithromycin (ZITHROMAX)  500 mg in sodium chloride 0.9 % 250 mL IVPB  500 mg Intravenous Q24H Vernelle Emerald, MD 250 mL/hr at 11/22/20 2211 500 mg at 11/22/20 2211  . bupivacaine-epinephrine (MARCAINE W/ EPI) 0.5% -1:200000 injection    PRN Rod Can, MD   30 mL at 11/23/20 1347  . [MAR Hold] cefTRIAXone (ROCEPHIN) 2 g in sodium chloride 0.9 % 100 mL IVPB  2 g Intravenous Q24H Vernelle Emerald, MD 200 mL/hr at 11/22/20 2121 2 g at 11/22/20 2121  . chlorhexidine (HIBICLENS) 4 % liquid 4 application  60 mL Topical Once Rod Can, MD      . Doug Sou Hold] divalproex (DEPAKOTE SPRINKLE) capsule 125 mg  125 mg Oral Q12H Shalhoub, Sherryll Burger, MD   125 mg at 11/23/20 0900  . [MAR Hold] enoxaparin (LOVENOX) injection 40 mg  40 mg Subcutaneous Q24H Shalhoub, Sherryll Burger, MD   40 mg at 11/22/20 2120  . [MAR Hold] escitalopram (LEXAPRO) tablet 15 mg  15 mg Oral q morning Shalhoub, Sherryll Burger, MD   15 mg at 11/23/20 0901  . [START ON 11/24/2020] feeding supplement (ENSURE ENLIVE / ENSURE PLUS) liquid 237 mL  237 mL Oral BID BM Little Ishikawa, MD      . Doug Sou Hold] fentaNYL (SUBLIMAZE) injection 12.5 mcg  12.5 mcg Intravenous Q2H PRN Vernelle Emerald, MD   12.5 mcg at 11/22/20 1354   Or  . [MAR Hold] fentaNYL (SUBLIMAZE) injection 25 mcg  25 mcg Intravenous Q2H PRN Shalhoub, Sherryll Burger, MD      . Doug Sou Hold] fluticasone furoate-vilanterol (BREO ELLIPTA) 200-25 MCG/INH 1 puff  1 puff Inhalation Daily Shalhoub, Sherryll Burger, MD   1 puff at 11/22/20 0844  . [MAR Hold] guaiFENesin (MUCINEX) 12 hr tablet 1,200 mg  1,200 mg Oral BID Vernelle Emerald, MD   1,200 mg at 11/23/20 0901  . [MAR Hold] hydrALAZINE (APRESOLINE) injection 10 mg  10 mg Intravenous Q6H PRN Shalhoub, Sherryll Burger, MD      . ketorolac (TORADOL) 30 MG/ML injection    PRN Rod Can, MD   30 mg at 11/23/20 1347  . lactated ringers infusion   Intravenous Continuous Duane Boston, MD 10 mL/hr at 11/23/20 1121 New Bag at 11/23/20 1121  . [MAR Hold] levothyroxine  (SYNTHROID) tablet 112 mcg  112 mcg Oral q morning Shalhoub, Sherryll Burger, MD   [phone]  mcg at 11/22/20 0555  . [MAR Hold] loratadine (CLARITIN) tablet 10 mg  10 mg Oral q morning Shalhoub, Sherryll Burger, MD   10 mg at 11/22/20 0834  . [MAR Hold] LORazepam (ATIVAN) tablet 0.5 mg  0.5 mg Oral BID PRN Vernelle Emerald, MD   0.5 mg at 11/22/20 2213  . [MAR Hold] melatonin tablet 3 mg  3 mg Oral QHS PRN Shalhoub, Sherryll Burger, MD   3 mg at 11/22/20 2213  . [START ON 11/24/2020] multivitamin with minerals tablet 1 tablet  1 tablet Oral Daily Little Ishikawa, MD      . Doug Sou Hold] ondansetron [facility]) injection 4 mg  4 mg Intravenous Q6H PRN Shalhoub, Sherryll Burger, MD      . Doug Sou Hold] pantoprazole (PROTONIX) EC tablet 40 mg  40 mg Oral q morning Shalhoub, Sherryll Burger, MD   40 mg at 11/23/20 0902  . [MAR Hold] polyethylene glycol (MIRALAX / GLYCOLAX) packet 17 g  17 g Oral Daily PRN Shalhoub, Sherryll Burger, MD      . povidone-iodine 10 % swab 2 application  2 application Topical Once Swinteck, Aaron Edelman, MD      . povidone-iodine 10 % swab 2 application  2 application Topical Once Swinteck, Aaron Edelman, MD      . sodium chloride (PF) 0.9 % injection    PRN Rod Can, MD   20 mL at 11/23/20 1347  . sodium chloride irrigation 0.9 %    PRN Rod Can, MD   3,000 mL at 11/23/20 1349  . [MAR Hold] triamcinolone (NASACORT) nasal inhaler 4 spray  4 spray Nasal QHS Shalhoub, Sherryll Burger, MD   4 spray at 11/22/20 2124   Facility-Administered Medications Ordered in Other Encounters  Medication Dose Route Frequency Provider Last Rate Last Admin  . dexamethasone (DECADRON) injection   Intravenous Anesthesia Intra-op Jenne Campus, CRNA   4 mg at 11/23/20 1304  . fentaNYL (SUBLIMAZE) injection   Intravenous Anesthesia Intra-op Jenne Campus, CRNA   50 mcg at 11/23/20 1318  . lactated ringers infusion   Intravenous Continuous PRN Jenne Campus, CRNA   New Bag at 11/23/20 1225  . lidocaine 2% (20 mg/mL) 5 mL syringe   Intravenous  Anesthesia Intra-op Jenne Campus, CRNA   20 mg at 11/23/20 1234  . ondansetron (ZOFRAN) injection   Intravenous Anesthesia Intra-op Jenne Campus, CRNA   4 mg at 11/23/20 1354  . propofol (DIPRIVAN) 10 mg/mL bolus/IV push   Intravenous Anesthesia Intra-op Jenne Campus, CRNA   80 mg at 11/23/20 1234  . rocuronium (ZEMURON) injection   Intravenous Anesthesia Intra-op Jenne Campus, CRNA   60 mg at 11/23/20 1235     Objective: Vital signs in last 24 hours: BP (!) 151/74 (BP Location: Left Arm)   Pulse 82   Temp 98.4 F (36.9 C) (Oral)   Resp 17   Ht 5\' 6"  (1.676 m)   Wt 67.6 kg   SpO2 95%   BMI 24.05 kg/m   Intake/Output from previous day: 02/27 0701 - 02/28 0700 In: 1442.3 [P.O.:720; IV Piggyback:722.3] Out: 800 [Urine:800] Intake/Output this shift: Total I/O In: 600 [I.V.:600] Out: 1100 [Urine:900; Blood:200]   Physical Exam Genitourinary:    Comments: She has severe introital stenosis with urethral retraction, but I could visualize the meatus.      Lab Results:  Results for orders placed or performed during the hospital encounter of 11/21/20 (from the past 24 hour(s))  Basic metabolic  panel     Status: Abnormal   Collection Time: 11/23/20  5:42 AM  Result Value Ref Range   Sodium 140 135 - 145 mmol/L   Potassium 3.6 3.5 - 5.1 mmol/L   Chloride 103 98 - 111 mmol/L   CO2 26 22 - 32 mmol/L   Glucose, Bld 99 70 - 99 mg/dL   BUN 13 8 - 23 mg/dL   Creatinine, Ser 0.60 0.44 - 1.00 mg/dL   Calcium 8.8 (L) 8.9 - 10.3 mg/dL   GFR, Estimated >60 >60 mL/min   Anion gap 11 5 - 15  CBC     Status: None   Collection Time: 11/23/20  5:42 AM  Result Value Ref Range   WBC 9.2 4.0 - 10.5 K/uL   RBC 4.66 3.87 - 5.11 MIL/uL   Hemoglobin 13.8 12.0 - 15.0 g/dL   HCT 42.7 36.0 - 46.0 %   MCV 91.6 80.0 - 100.0 fL   MCH 29.6 26.0 - 34.0 pg   MCHC 32.3 30.0 - 36.0 g/dL   RDW 13.5 11.5 - 15.5 %   Platelets 199 150 - 400 K/uL   nRBC 0.0 0.0 - 0.2 %    BMET Recent  Labs    11/21/20 1805 11/23/20 0542  NA 140 140  K 4.0 3.6  CL 103 103  CO2 26 26  GLUCOSE 134* 99  BUN 22 13  CREATININE 0.66 0.60  CALCIUM 8.9 8.8*   PT/INR Recent Labs    11/21/20 1805  LABPROT 13.2  INR 1.0   ABG No results for input(s): PHART, HCO3 in the last 72 hours.  Invalid input(s): PCO2, PO2  Studies/Results: DG Chest 1 View  Result Date: 11/21/2020 CLINICAL DATA:  Pain post fall. EXAM: CHEST  1 VIEW COMPARISON:  September 24, 2020 FINDINGS: Enlarged cardiac silhouette. Calcific atherosclerotic disease and tortuosity of the aorta. Left costophrenic angle consolidation. Increased interstitial markings. Osseous structures are without acute abnormality. Soft tissues are grossly normal. IMPRESSION: 1. Left costophrenic angle consolidation which may represent pleural effusion, atelectasis or airspace disease. 2. Increased interstitial markings which may be seen with pulmonary edema. Electronically Signed   By: Fidela Salisbury M.D.   On: 11/21/2020 18:03   CT HEAD WO CONTRAST  Result Date: 11/21/2020 CLINICAL DATA:  Neck injury. EXAM: CT HEAD WITHOUT CONTRAST CT CERVICAL SPINE WITHOUT CONTRAST TECHNIQUE: Multidetector CT imaging of the head and cervical spine was performed following the standard protocol without intravenous contrast. Multiplanar CT image reconstructions of the cervical spine were also generated. COMPARISON:  October 15, 2016. FINDINGS: CT HEAD FINDINGS Brain: Mild chronic ischemic white matter disease is noted. Mild diffuse cortical atrophy is noted. No mass effect or midline shift is noted. Ventricular size is within normal limits. There is no evidence of mass lesion, hemorrhage or acute infarction. Vascular: No hyperdense vessel or unexpected calcification. Skull: Normal. Negative for fracture or focal lesion. Sinuses/Orbits: No acute finding. Other: None. CT CERVICAL SPINE FINDINGS Alignment: Mild grade 1 anterolisthesis of C4-5 is noted secondary to  posterior facet joint hypertrophy. Skull base and vertebrae: No acute fracture. No primary bone lesion or focal pathologic process. Soft tissues and spinal canal: No prevertebral fluid or swelling. No visible canal hematoma. Disc levels: Moderate degenerative disc disease is noted at C3-4 and C5-6. Severe degenerative disc disease is noted at C6-7. Upper chest: Negative. Other: None. IMPRESSION: 1. Mild chronic ischemic white matter disease. Mild diffuse cortical atrophy. No acute intracranial abnormality seen. 2. Multilevel degenerative disc  disease. No acute abnormality seen in the cervical spine. Electronically Signed   By: Marijo Conception M.D.   On: 11/21/2020 19:18   CT CERVICAL SPINE WO CONTRAST  Result Date: 11/21/2020 CLINICAL DATA:  Neck injury. EXAM: CT HEAD WITHOUT CONTRAST CT CERVICAL SPINE WITHOUT CONTRAST TECHNIQUE: Multidetector CT imaging of the head and cervical spine was performed following the standard protocol without intravenous contrast. Multiplanar CT image reconstructions of the cervical spine were also generated. COMPARISON:  October 15, 2016. FINDINGS: CT HEAD FINDINGS Brain: Mild chronic ischemic white matter disease is noted. Mild diffuse cortical atrophy is noted. No mass effect or midline shift is noted. Ventricular size is within normal limits. There is no evidence of mass lesion, hemorrhage or acute infarction. Vascular: No hyperdense vessel or unexpected calcification. Skull: Normal. Negative for fracture or focal lesion. Sinuses/Orbits: No acute finding. Other: None. CT CERVICAL SPINE FINDINGS Alignment: Mild grade 1 anterolisthesis of C4-5 is noted secondary to posterior facet joint hypertrophy. Skull base and vertebrae: No acute fracture. No primary bone lesion or focal pathologic process. Soft tissues and spinal canal: No prevertebral fluid or swelling. No visible canal hematoma. Disc levels: Moderate degenerative disc disease is noted at C3-4 and C5-6. Severe degenerative  disc disease is noted at C6-7. Upper chest: Negative. Other: None. IMPRESSION: 1. Mild chronic ischemic white matter disease. Mild diffuse cortical atrophy. No acute intracranial abnormality seen. 2. Multilevel degenerative disc disease. No acute abnormality seen in the cervical spine. Electronically Signed   By: Marijo Conception M.D.   On: 11/21/2020 19:18   DG Knee Right Port  Result Date: 11/22/2020 CLINICAL DATA:  Femoral fracture EXAM: PORTABLE RIGHT KNEE - 1-2 VIEW COMPARISON:  November 21, 2020 FINDINGS: Osteopenia. No acute fracture or dislocation. Mild degenerative changes of the medial compartment. No area of erosion or osseous destruction. No unexpected radiopaque foreign body. Vascular calcifications. IMPRESSION: No acute fracture or dislocation of the right knee. Electronically Signed   By: Valentino Saxon MD   On: 11/22/2020 09:05   DG C-Arm 1-60 Min  Result Date: 11/23/2020 CLINICAL DATA:  Hemiarthroplasty. EXAM: OPERATIVE RIGHT HIP (WITH PELVIS IF PERFORMED) 2 VIEWS TECHNIQUE: Fluoroscopic spot image(s) were submitted for interpretation post-operatively. COMPARISON:  November 21, 2020. FINDINGS: Fluoro time: 5 seconds. Reported radiation: 0.30 mGy. Two C-arm fluoroscopic images were obtained intraoperatively and submitted for post operative interpretation. These images demonstrate postsurgical changes of right hemiarthroplasty. No unexpected findings. Please see the performing provider's procedural report for further detail. IMPRESSION: Intraoperative fluoroscopy, as detailed above. Electronically Signed   By: Margaretha Sheffield MD   On: 11/23/2020 14:20   DG HIP OPERATIVE UNILAT W OR W/O PELVIS RIGHT  Result Date: 11/23/2020 CLINICAL DATA:  Hemiarthroplasty. EXAM: OPERATIVE RIGHT HIP (WITH PELVIS IF PERFORMED) 2 VIEWS TECHNIQUE: Fluoroscopic spot image(s) were submitted for interpretation post-operatively. COMPARISON:  November 21, 2020. FINDINGS: Fluoro time: 5 seconds. Reported  radiation: 0.30 mGy. Two C-arm fluoroscopic images were obtained intraoperatively and submitted for post operative interpretation. These images demonstrate postsurgical changes of right hemiarthroplasty. No unexpected findings. Please see the performing provider's procedural report for further detail. IMPRESSION: Intraoperative fluoroscopy, as detailed above. Electronically Signed   By: Margaretha Sheffield MD   On: 11/23/2020 14:20   DG Hip Unilat With Pelvis 2-3 Views Right  Result Date: 11/21/2020 CLINICAL DATA:  Pain post fall. EXAM: DG HIP (WITH OR WITHOUT PELVIS) 2-3V RIGHT COMPARISON:  None. FINDINGS: Sub capitellar impacted fracture of the right femoral neck with superior  displacement of the distal fracture fragment. Femoral head is normally located within the acetabulum. IMPRESSION: Sub capitellar impacted fracture of the right femoral neck with superior displacement of the distal fracture fragment. Electronically Signed   By: Fidela Salisbury M.D.   On: 11/21/2020 18:04   Procedure:  She was on the OR table with spreader bars.  The genitalia was prepped with betadine and using visual and fingertip guidance, a 14 fr foley was placed with return of clear urine.  The balloon was filled with 26ml of sterile water and placed to a drainage bag.  There were no complications.   Assessment/Plan: Introital stenosis with difficult foley placement.   Continue foley drainage until not needed for medical management.         No follow-ups on file.    CC: Holli Humbles MD.    Irine Seal 11/23/2020 [phone]

## 2020-11-23 NOTE — Transfer of Care (Signed)
Immediate Anesthesia Transfer of Care Note  Patient: Dawn Woods  Procedure(s) Performed: ANTERIOR APPROACH HEMI HIP ARTHROPLASTY (Right ) Insertion of Foley Catheter  Patient Location: PACU  Anesthesia Type:General  Level of Consciousness: awake and patient cooperative  Airway & Oxygen Therapy: Patient Spontanous Breathing and Patient connected to nasal cannula oxygen  Post-op Assessment: Report given to RN  Post vital signs: Reviewed and stable  Last Vitals:  Vitals Value Taken Time  BP 150/71 11/23/20 1445  Temp 37 C 11/23/20 1445  Pulse 79 11/23/20 1452  Resp 20 11/23/20 1452  SpO2 92 % 11/23/20 1452  Vitals shown include unvalidated device data.  Last Pain:  Vitals:   11/23/20 0813  TempSrc: Oral  PainSc:          Complications: No complications documented.

## 2020-11-23 NOTE — Anesthesia Preprocedure Evaluation (Addendum)
Anesthesia Evaluation  Patient identified by MRN, date of birth, ID band Patient confused    Reviewed: Allergy & Precautions, NPO status , Patient's Chart, lab work & pertinent test results  History of Anesthesia Complications (+) PONV and history of anesthetic complications  Airway Mallampati: III  TM Distance: >3 FB Neck ROM: Full    Dental no notable dental hx. (+) Dental Advisory Given, Poor Dentition, Loose   Pulmonary COPD, former smoker,    Pulmonary exam normal        Cardiovascular hypertension, Normal cardiovascular exam     Neuro/Psych PSYCHIATRIC DISORDERS Depression Dementia negative neurological ROS     GI/Hepatic Neg liver ROS, GERD  ,  Endo/Other  Hypothyroidism   Renal/GU negative Renal ROS     Musculoskeletal negative musculoskeletal ROS (+)   Abdominal   Peds  Hematology negative hematology ROS (+)   Anesthesia Other Findings   Reproductive/Obstetrics                          Anesthesia Physical Anesthesia Plan  ASA: III  Anesthesia Plan: General   Post-op Pain Management:    Induction: Intravenous  PONV Risk Score and Plan: 3 and Ondansetron, Propofol infusion and Dexamethasone  Airway Management Planned: Oral ETT  Additional Equipment:   Intra-op Plan:   Post-operative Plan:   Informed Consent: I have reviewed the patients History and Physical, chart, labs and discussed the procedure including the risks, benefits and alternatives for the proposed anesthesia with the patient or authorized representative who has indicated his/her understanding and acceptance.   Patient has DNR.  Discussed DNR with power of attorney and Suspend DNR.   Dental advisory given and Consent reviewed with POA  Plan Discussed with: Anesthesiologist, CRNA and Surgeon  Anesthesia Plan Comments:       Anesthesia Quick Evaluation

## 2020-11-23 NOTE — Progress Notes (Signed)
PROGRESS NOTE    Dawn Woods  MWN:[id number] DOB: 1930-08-23 DOA: 11/21/2020 PCP: Marletta Lor, MD (Inactive)   Brief Narrative:  87 year old female with past medical history of dementia, COPD, chronic respiratory failure on 3 L of oxygen via nasal cannula, hypothyroidism, hypertension who presents to [facility] emergency department via EMS from [facility] skilled nursing facility status post fall with right hip pain. Patient is an extremely poor historian due to advanced dementia. Upon evaluation in the emergency department right hip fracture revealed subcapitellar impacted fracture of the right femoral neck with superior displacement.  CT head revealed no active disease.  Chest x-ray however did reveal a left lower lobe infiltrate.  Case was discussed with Dr. Stann Mainland with orthopedic surgery who stated that orthopedics will evaluate the patient in the morning in consultation with tentative planned surgical intervention on Monday by Dr. Lyla Glassing.  The hospitalist group was then called to assist patient with admission to the hospital.  Assessment & Plan:   Principal Problem:   Closed right hip fracture [facility]) Active Problems:   Hypothyroidism   GERD without esophagitis   Dementia with behavioral disturbance (HCC)   Essential hypertension   Pneumonia of left lower lobe due to infectious organism   Fall at home, initial encounter   COPD (chronic obstructive pulmonary disease) ([place])   Chronic respiratory failure with hypoxia ([place])   Foot fracture, right  Closed right hip fracture ([place]) with profound ambulatory dysfunction - Patient has had multiple falls over the past few months, upwards of 5-6 now with new fracture as noted on imaging  - Orthopedic surgery following, appreciate insight recommendations - Planned ORIF later this morning - Continue limited and low-dose narcotics as necessary for pain control  Questionable pneumonia with acute on chronic hypoxic  respiratory failure  - Mild leukocytosis resolved - Continue to titrate oxygen to maintain sats 88 to 92%, baseline oxygen is 3 L -continues at home oxygen level without hypoxia - Continue ceftriaxone, azithromycin  Hypothyroidism, stable - Continue home regimen of Synthroid  GERD without esophagitis - Continue home regimen of PPI  Essential hypertension - Continue home regimen of antihypertensive therapy including amlodipine  COPD (chronic obstructive pulmonary disease) (HCC) with chronic respiratory failure - No evidence of COPD exacerbation this time - Continue home regimen of maintenance inhalers -On 3 L nasal cannula around-the-clock at home  Dementia with behavioral disturbance - Longstanding history of advanced dementia - Depakote - for behavoiral issues with other patients - Continuing to achieve pain control with as needed analgesics with smallest doses possible to minimize worsening confusion - Fall precautions - Frequent redirection of patient -Patient's daughter is available nearby for visiting if necessary to help reorient patient  Goals of care : Patient assessed at her assisted living facility for skilled care as of July 2021, at that time the facility did not think the patient would meet criteria for skilled care but given her multiple falls recently and new fracture family will have facility reevaluate upon discharge back to their care.    DVT prophylaxis: Lovenox Code Status: DNR Family Communication: Care plan discussed at length with son Lissa Merlin) and daughter-in-law over the phone at length -daughter at bedside, discussed goals, disposition plan and prognosis.  Status is: Inpatient  Dispo: The patient is from: Facility              Anticipated d/c is to: Same              Anticipated d/c date  is: 40 to 72 hours pending clinical course and surgery schedule              Patient currently not medically stable for discharge  Consultants:   Orthopedic  surgery  Procedures:   Pending ORIF 11/23/2020  Antimicrobials:  Azithromycin, ceftriaxone x5 days  Subjective: No acute issues or events overnight per staff, patient's review of systems markedly limited given her baseline dementia, oriented to person only.  Objective: Vitals:   11/22/20 0857 11/22/20 1604 11/22/20 2104 11/23/20 0529  BP: (!) 150/73 (!) 165/102 (!) 165/83 (!) 175/91  Pulse: 84 87 83 80  Resp: 17 18 18 16   Temp: 97.8 F (36.6 C) 98.5 F (36.9 C) 98.3 F (36.8 C) 98.4 F (36.9 C)  TempSrc:  Oral Oral Oral  SpO2: 95% 92% 93% 95%    Intake/Output Summary (Last 24 hours) at 11/23/2020 0735 Last data filed at 11/23/2020 0300 Gross per 24 hour  Intake 1442.3 ml  Output 800 ml  Net 642.3 ml   There were no vitals filed for this visit.  Examination:  General:  Pleasantly resting in bed, No acute distress.  Alert to person only HEENT:  Normocephalic atraumatic.  Sclerae nonicteric, noninjected.  Extraocular movements intact bilaterally. Neck:  Without mass or deformity.  Trachea is midline. Lungs:  Clear to auscultate bilaterally without rhonchi, wheeze, or rales. Heart:  Regular rate and rhythm.  Without murmurs, rubs, or gallops. Abdomen:  Soft, nontender, nondistended.  Without guarding or rebound. Extremities: Without cyanosis, clubbing, edema, or obvious deformity. Vascular:  Dorsalis pedis and posterior tibial pulses palpable bilaterally. Skin:  Warm and dry, no erythema, no ulcerations.  Data Reviewed: I have personally reviewed following labs and imaging studies  CBC: Recent Labs  Lab 11/21/20 1805 11/23/20 0542  WBC 10.8* 9.2  NEUTROABS 9.4*  --   HGB 13.6 13.8  HCT 43.4 42.7  MCV 92.9 91.6  PLT 216 938   Basic Metabolic Panel: Recent Labs  Lab 11/21/20 1805 11/23/20 0542  NA 140 140  K 4.0 3.6  CL 103 103  CO2 26 26  GLUCOSE 134* 99  BUN 22 13  CREATININE 0.66 0.60  CALCIUM 8.9 8.8*   GFR: CrCl cannot be calculated (Unknown  ideal weight.). Liver Function Tests: No results for input(s): AST, ALT, ALKPHOS, BILITOT, PROT, ALBUMIN in the last 168 hours. No results for input(s): LIPASE, AMYLASE in the last 168 hours. No results for input(s): AMMONIA in the last 168 hours. Coagulation Profile: Recent Labs  Lab 11/21/20 1805  INR 1.0   Cardiac Enzymes: No results for input(s): CKTOTAL, CKMB, CKMBINDEX, TROPONINI in the last 168 hours. BNP (last 3 results) No results for input(s): PROBNP in the last 8760 hours. HbA1C: Recent Labs    11/22/20 0615  HGBA1C 5.4   CBG: No results for input(s): GLUCAP in the last 168 hours. Lipid Profile: No results for input(s): CHOL, HDL, LDLCALC, TRIG, CHOLHDL, LDLDIRECT in the last 72 hours. Thyroid Function Tests: No results for input(s): TSH, T4TOTAL, FREET4, T3FREE, THYROIDAB in the last 72 hours. Anemia Panel: No results for input(s): VITAMINB12, FOLATE, FERRITIN, TIBC, IRON, RETICCTPCT in the last 72 hours. Sepsis Labs: Recent Labs  Lab 11/22/20 0615  PROCALCITON <0.10  LATICACIDVEN 1.3    Recent Results (from the past 240 hour(s))  Resp Panel by RT-PCR (Flu A&B, Covid) Nasopharyngeal Swab     Status: None   Collection Time: 11/21/20  6:12 PM   Specimen: Nasopharyngeal Swab; Nasopharyngeal(NP) swabs in  vial transport medium  Result Value Ref Range Status   SARS Coronavirus 2 by RT PCR NEGATIVE NEGATIVE Final    Comment: (NOTE) SARS-CoV-2 target nucleic acids are NOT DETECTED.  The SARS-CoV-2 RNA is generally detectable in upper respiratory specimens during the acute phase of infection. The lowest concentration of SARS-CoV-2 viral copies this assay can detect is 138 copies/mL. A negative result does not preclude SARS-Cov-2 infection and should not be used as the sole basis for treatment or other patient management decisions. A negative result may occur with  improper specimen collection/handling, submission of specimen other than nasopharyngeal swab,  presence of viral mutation(s) within the areas targeted by this assay, and inadequate number of viral copies(<138 copies/mL). A negative result must be combined with clinical observations, patient history, and epidemiological information. The expected result is Negative.  Fact Sheet for Patients:  EntrepreneurPulse.com.au  Fact Sheet for Healthcare Providers:  IncredibleEmployment.be  This test is no t yet approved or cleared by the [place] FDA and  has been authorized for detection and/or diagnosis of SARS-CoV-2 by FDA under an Emergency Use Authorization (EUA). This EUA will remain  in effect (meaning this test can be used) for the duration of the COVID-19 declaration under Section 564(b)(1) of the Act, 21 U.S.C.section 360bbb-3(b)(1), unless the authorization is terminated  or revoked sooner.       Influenza A by PCR NEGATIVE NEGATIVE Final   Influenza B by PCR NEGATIVE NEGATIVE Final    Comment: (NOTE) The Xpert Xpress SARS-CoV-2/FLU/RSV plus assay is intended as an aid in the diagnosis of influenza from Nasopharyngeal swab specimens and should not be used as a sole basis for treatment. Nasal washings and aspirates are unacceptable for Xpert Xpress SARS-CoV-2/FLU/RSV testing.  Fact Sheet for Patients: EntrepreneurPulse.com.au  Fact Sheet for Healthcare Providers: IncredibleEmployment.be  This test is not yet approved or cleared by the [place] FDA and has been authorized for detection and/or diagnosis of SARS-CoV-2 by FDA under an Emergency Use Authorization (EUA). This EUA will remain in effect (meaning this test can be used) for the duration of the COVID-19 declaration under Section 564(b)(1) of the Act, 21 U.S.C. section 360bbb-3(b)(1), unless the authorization is terminated or revoked.  Performed at [facility], [place] [address], [place], [place] [address]   Culture, blood  (routine x 2)     Status: None (Preliminary result)   Collection Time: 11/21/20  8:55 PM   Specimen: BLOOD  Result Value Ref Range Status   Specimen Description BLOOD RIGHT HAND  Final   Special Requests   Final    BOTTLES DRAWN AEROBIC AND ANAEROBIC Blood Culture adequate volume   Culture  Setup Time   Final    GRAM POSITIVE COCCI IN CLUSTERS GRAM POSITIVE COCCI IN CHAINS AEROBIC BOTTLE ONLY CRITICAL RESULT CALLED TO, READ BACK BY AND VERIFIED WITH: [place] [id number] AT [id number] BY CM Performed at [facility], [place] [address], [place],  [address]    Culture GRAM POSITIVE COCCI  Final   Report Status PENDING  Incomplete  Blood Culture ID Panel (Reflexed)     Status: Abnormal   Collection Time: 11/21/20  8:55 PM  Result Value Ref Range Status   Enterococcus faecalis NOT DETECTED NOT DETECTED Final   Enterococcus Faecium NOT DETECTED NOT DETECTED Final   Listeria monocytogenes NOT DETECTED NOT DETECTED Final   Staphylococcus species DETECTED (A) NOT DETECTED Final    Comment: CRITICAL RESULT CALLED TO, READ BACK BY  AND VERIFIED WITH: PHARMD M [place] [id number] AT [id number] BY CM    Staphylococcus aureus (BCID) NOT DETECTED NOT DETECTED Final   Staphylococcus epidermidis NOT DETECTED NOT DETECTED Final   Staphylococcus lugdunensis NOT DETECTED NOT DETECTED Final   Streptococcus species DETECTED (A) NOT DETECTED Final    Comment: Not Enterococcus species, Streptococcus agalactiae, Streptococcus pyogenes, or Streptococcus pneumoniae. CRITICAL RESULT CALLED TO, READ BACK BY AND VERIFIED WITH: PHARMD M [place] [id number] AT 66 BY CM    Streptococcus agalactiae NOT DETECTED NOT DETECTED Final   Streptococcus pneumoniae NOT DETECTED NOT DETECTED Final   Streptococcus pyogenes NOT DETECTED NOT DETECTED Final   A.calcoaceticus-baumannii NOT DETECTED NOT DETECTED Final   Bacteroides fragilis NOT DETECTED NOT DETECTED Final   Enterobacterales NOT DETECTED NOT DETECTED Final    Enterobacter cloacae complex NOT DETECTED NOT DETECTED Final   Escherichia coli NOT DETECTED NOT DETECTED Final   Klebsiella aerogenes NOT DETECTED NOT DETECTED Final   Klebsiella oxytoca NOT DETECTED NOT DETECTED Final   Klebsiella pneumoniae NOT DETECTED NOT DETECTED Final   Proteus species NOT DETECTED NOT DETECTED Final   Salmonella species NOT DETECTED NOT DETECTED Final   Serratia marcescens NOT DETECTED NOT DETECTED Final   Haemophilus influenzae NOT DETECTED NOT DETECTED Final   Neisseria meningitidis NOT DETECTED NOT DETECTED Final   Pseudomonas aeruginosa NOT DETECTED NOT DETECTED Final   Stenotrophomonas maltophilia NOT DETECTED NOT DETECTED Final   Candida albicans NOT DETECTED NOT DETECTED Final   Candida auris NOT DETECTED NOT DETECTED Final   Candida glabrata NOT DETECTED NOT DETECTED Final   Candida krusei NOT DETECTED NOT DETECTED Final   Candida parapsilosis NOT DETECTED NOT DETECTED Final   Candida tropicalis NOT DETECTED NOT DETECTED Final   Cryptococcus neoformans/gattii NOT DETECTED NOT DETECTED Final    Comment: Performed at [facility], [address] [place], [place] [address]  MRSA PCR Screening     Status: None   Collection Time: 11/22/20 10:55 AM   Specimen: Nasal Mucosa; Nasopharyngeal  Result Value Ref Range Status   MRSA by PCR NEGATIVE NEGATIVE Final    Comment:        The GeneXpert MRSA Assay (FDA approved for NASAL specimens only), is one component of a comprehensive MRSA colonization surveillance program. It is not intended to diagnose MRSA infection nor to guide or monitor treatment for MRSA infections. Performed at [facility], [place] [address] [place], [place] [address]          Radiology Studies: DG Chest 1 View  Result Date: 11/21/2020 CLINICAL DATA:  Pain post fall. EXAM: CHEST  1 VIEW COMPARISON:  September 24, 2020 FINDINGS: Enlarged cardiac silhouette. Calcific atherosclerotic disease and tortuosity of the aorta.  Left costophrenic angle consolidation. Increased interstitial markings. Osseous structures are without acute abnormality. Soft tissues are grossly normal. IMPRESSION: 1. Left costophrenic angle consolidation which may represent pleural effusion, atelectasis or airspace disease. 2. Increased interstitial markings which may be seen with pulmonary edema. Electronically Signed   By: Fidela Salisbury M.D.   On: 11/21/2020 18:03   CT HEAD WO CONTRAST  Result Date: 11/21/2020 CLINICAL DATA:  Neck injury. EXAM: CT HEAD WITHOUT CONTRAST CT CERVICAL SPINE WITHOUT CONTRAST TECHNIQUE: Multidetector CT imaging of the head and cervical spine was performed following the standard protocol without intravenous contrast. Multiplanar CT image reconstructions of the cervical spine were also generated. COMPARISON:  October 15, 2016. FINDINGS: CT HEAD FINDINGS Brain: Mild chronic ischemic white matter disease is  noted. Mild diffuse cortical atrophy is noted. No mass effect or midline shift is noted. Ventricular size is within normal limits. There is no evidence of mass lesion, hemorrhage or acute infarction. Vascular: No hyperdense vessel or unexpected calcification. Skull: Normal. Negative for fracture or focal lesion. Sinuses/Orbits: No acute finding. Other: None. CT CERVICAL SPINE FINDINGS Alignment: Mild grade 1 anterolisthesis of C4-5 is noted secondary to posterior facet joint hypertrophy. Skull base and vertebrae: No acute fracture. No primary bone lesion or focal pathologic process. Soft tissues and spinal canal: No prevertebral fluid or swelling. No visible canal hematoma. Disc levels: Moderate degenerative disc disease is noted at C3-4 and C5-6. Severe degenerative disc disease is noted at C6-7. Upper chest: Negative. Other: None. IMPRESSION: 1. Mild chronic ischemic white matter disease. Mild diffuse cortical atrophy. No acute intracranial abnormality seen. 2. Multilevel degenerative disc disease. No acute abnormality  seen in the cervical spine. Electronically Signed   By: Marijo Conception M.D.   On: 11/21/2020 19:18   CT CERVICAL SPINE WO CONTRAST  Result Date: 11/21/2020 CLINICAL DATA:  Neck injury. EXAM: CT HEAD WITHOUT CONTRAST CT CERVICAL SPINE WITHOUT CONTRAST TECHNIQUE: Multidetector CT imaging of the head and cervical spine was performed following the standard protocol without intravenous contrast. Multiplanar CT image reconstructions of the cervical spine were also generated. COMPARISON:  October 15, 2016. FINDINGS: CT HEAD FINDINGS Brain: Mild chronic ischemic white matter disease is noted. Mild diffuse cortical atrophy is noted. No mass effect or midline shift is noted. Ventricular size is within normal limits. There is no evidence of mass lesion, hemorrhage or acute infarction. Vascular: No hyperdense vessel or unexpected calcification. Skull: Normal. Negative for fracture or focal lesion. Sinuses/Orbits: No acute finding. Other: None. CT CERVICAL SPINE FINDINGS Alignment: Mild grade 1 anterolisthesis of C4-5 is noted secondary to posterior facet joint hypertrophy. Skull base and vertebrae: No acute fracture. No primary bone lesion or focal pathologic process. Soft tissues and spinal canal: No prevertebral fluid or swelling. No visible canal hematoma. Disc levels: Moderate degenerative disc disease is noted at C3-4 and C5-6. Severe degenerative disc disease is noted at C6-7. Upper chest: Negative. Other: None. IMPRESSION: 1. Mild chronic ischemic white matter disease. Mild diffuse cortical atrophy. No acute intracranial abnormality seen. 2. Multilevel degenerative disc disease. No acute abnormality seen in the cervical spine. Electronically Signed   By: Marijo Conception M.D.   On: 11/21/2020 19:18   DG Knee Right Port  Result Date: 11/22/2020 CLINICAL DATA:  Femoral fracture EXAM: PORTABLE RIGHT KNEE - 1-2 VIEW COMPARISON:  November 21, 2020 FINDINGS: Osteopenia. No acute fracture or dislocation. Mild  degenerative changes of the medial compartment. No area of erosion or osseous destruction. No unexpected radiopaque foreign body. Vascular calcifications. IMPRESSION: No acute fracture or dislocation of the right knee. Electronically Signed   By: Valentino Saxon MD   On: 11/22/2020 09:05   DG Hip Unilat With Pelvis 2-3 Views Right  Result Date: 11/21/2020 CLINICAL DATA:  Pain post fall. EXAM: DG HIP (WITH OR WITHOUT PELVIS) 2-3V RIGHT COMPARISON:  None. FINDINGS: Sub capitellar impacted fracture of the right femoral neck with superior displacement of the distal fracture fragment. Femoral head is normally located within the acetabulum. IMPRESSION: Sub capitellar impacted fracture of the right femoral neck with superior displacement of the distal fracture fragment. Electronically Signed   By: Fidela Salisbury M.D.   On: 11/21/2020 18:04        Scheduled Meds: . amLODipine  10 mg  Oral q morning  . chlorhexidine  60 mL Topical Once  . divalproex  125 mg Oral Q12H  . enoxaparin (LOVENOX) injection  40 mg Subcutaneous Q24H  . escitalopram  15 mg Oral q morning  . fluticasone furoate-vilanterol  1 puff Inhalation Daily  . guaiFENesin  1,200 mg Oral BID  . levothyroxine  112 mcg Oral q morning  . loratadine  10 mg Oral q morning  . pantoprazole  40 mg Oral q morning  . povidone-iodine  2 application Topical Once  . povidone-iodine  2 application Topical Once  . triamcinolone  4 spray Nasal QHS   Continuous Infusions: . azithromycin 500 mg (11/22/20 2211)  .  ceFAZolin (ANCEF) IV    . cefTRIAXone (ROCEPHIN)  IV 2 g (11/22/20 2121)  . tranexamic acid       LOS: 1 day   Time spent: 38min  William C Lancaster, DO [facility]  If 7PM-7AM, please contact night-coverage [email or web address]  11/23/2020, 7:35 AM

## 2020-11-23 NOTE — Op Note (Signed)
OPERATIVE REPORT  SURGEON: Rod Can, MD   ASSISTANT: Cherlynn June, PA-C  PREOPERATIVE DIAGNOSIS: Displaced Right femoral neck fracture.   POSTOPERATIVE DIAGNOSIS: Displaced Right femoral neck fracture.   PROCEDURE: Right hip hemiarthroplasty, anterior approach.   IMPLANTS: DePuy Tri Lock stem, size 7, hi offset, with a -3 mm spacer and a 47 mm monopolar head ball.  ANESTHESIA:  General  ANTIBIOTICS: 2g ancef.  ESTIMATED BLOOD LOSS: 100 mL   DRAINS: None.  COMPLICATIONS: None   CONDITION: PACU - hemodynamically stable.   BRIEF CLINICAL NOTE: Dawn Woods is a 87 y.o. female with a displaced Right femoral neck fracture. The patient was admitted to the hospitalist service and underwent perioperative risk stratification and medical optimization. The risks, benefits, and alternatives to hemiarthroplasty were explained, and the patient elected to proceed.  PROCEDURE IN DETAIL: The patient was taken to the operating room and general anesthesia was induced on the hospital bed.  The patient was then positioned on the Hana table.  All bony prominences were well padded.  The hip was prepped and draped in the normal sterile surgical fashion.  A time-out was called verifying side and site of surgery. Antibiotics were given within 60 minutes of beginning the procedure.   The direct anterior approach to the hip was performed through the Hueter interval.  Lateral femoral circumflex vessels were treated with the Auqumantys. The anterior capsule was exposed and an inverted T capsulotomy was made.  Fracture hematoma was encountered and evacuated. The patient was found to have a comminuted Right subcapital femoral neck fracture.  I freshened the femoral neck cut with a saw.  I removed the femoral neck fragment.  A corkscrew was placed into the head and the head was removed.  This was passed to the back table and was measured.   Acetabular exposure was achieved.  I examined the articular  cartilage which was intact.  The labrum was intact. A 47 mm trial head was placed and found to have excellent fit.   I then gained femoral exposure taking care to protect the abductors and greater trochanter.  This was performed using standard external rotation, extension, and adduction.  The capsule was peeled off the inner aspect of the greater trochanter, taking care to preserve the short external rotators. A cookie cutter was used to enter the femoral canal, and then the femoral canal finder was used to confirm location.  I then sequentially broached up to a size 7.  Calcar planer was used on the femoral neck remnant.  I paced a hi neck and a 36 + 1.5 head ball. The hip was reduced.  Leg lengths were checked fluoroscopically.  The hip was dislocated and trial components were removed.  I placed the real stem followed by the real spacer and head ball.  A single reduction maneuver was performed and the hip was reduced.  Fluoroscopy was used to confirm component position and leg lengths.  At 90 degrees of external rotation and extension, the hip was stable to an anterior directed force.   The wound was copiously irrigated with Irrisept solution and normal saline using pule lavage.  Marcaine solution was injected into the periarticular soft tissue.  The wound was closed in layers using #1 Vicryl and V-Loc for the fascia, 2-0 Vicryl for the subcutaneous fat, 2-0 Monocryl for the deep dermal layer, and staples + glue for the skin.  Once the glue was fully dried, an Aquacell Ag dressing was applied.  The patient was then awakened  from anesthesia and transported to the recovery room in stable condition.  Sponge, needle, and instrument counts were correct at the end of the case x2.  The patient tolerated the procedure well and there were no known complications.  Please note that a surgical assistant was a medical necessity for this procedure to perform it in a safe and expeditious manner. Assistant was necessary to  provide appropriate retraction of vital neurovascular structures, to prevent femoral fracture, and to allow for anatomic placement of the prosthesis.

## 2020-11-23 NOTE — TOC Progression Note (Signed)
Transition of Care [facility]) - Progression Note    Patient Details  Name: Dawn Woods MRN: [id number] Date of Birth: 05-10-1930  Transition of Care [facility]) CM/SW Contact  Coralee Pesa, [place] Phone Number: 11/23/2020, 10:52 AM  Clinical Narrative:    CSW was notified that pt's family was requesting to speak with social work. They confirmed her insurance with another member of the [facility] team, but still requested to speak with CSW. CSW introduced herself to dtr and pt. Dtr requested information about the upcoming surgery. CSW clarified role and noted that after surgery TOC would be available for next steps. Dtr noted understanding and CSW notified nurse of questions. TOC will follow for disposition.         Expected Discharge Plan and Services                                                 Social Determinants of Health (SDOH) Interventions    Readmission Risk Interventions No flowsheet data found.

## 2020-11-23 NOTE — Discharge Instructions (Signed)
°Dr. Erique Kaser °Joint Replacement Specialist °[facility] °[address] °[place], [place] [address] °[phone] ° ° °TOTAL HIP REPLACEMENT POSTOPERATIVE DIRECTIONS ° ° ° °Hip Rehabilitation, Guidelines Following Surgery  ° °WEIGHT BEARING °Weight bearing as tolerated with assist device (walker, cane, etc) as directed, use it as long as suggested by your surgeon or therapist, typically at least 4-6 weeks. ° °The results of a hip operation are greatly improved after range of motion and muscle strengthening exercises. Follow all safety measures which are given to protect your hip. If any of these exercises cause increased pain or swelling in your joint, decrease the amount until you are comfortable again. Then slowly increase the exercises. Call your caregiver if you have problems or questions.  ° °HOME CARE INSTRUCTIONS  °Most of the following instructions are designed to prevent the dislocation of your new hip.  °Remove items at home which could result in a fall. This includes throw rugs or furniture in walking pathways.  °Continue medications as instructed at time of discharge. °· You may have some home medications which will be placed on hold until you complete the course of blood thinner medication. °· You may start showering once you are discharged home. Do not remove your dressing. °Do not put on socks or shoes without following the instructions of your caregivers.   °Sit on chairs with arms. Use the chair arms to help push yourself up when arising.  °Arrange for the use of a toilet seat elevator so you are not sitting low.  °· Walk with walker as instructed.  °You may resume a sexual relationship in one month or when given the OK by your caregiver.  °Use walker as long as suggested by your caregivers.  °You may put full weight on your legs and walk as much as is comfortable. °Avoid periods of inactivity such as sitting longer than an hour when not asleep. This helps prevent  blood clots.  °You may return to work once you are cleared by your surgeon.  °Do not drive a car for 6 weeks or until released by your surgeon.  °Do not drive while taking narcotics.  °Wear elastic stockings for two weeks following surgery during the day but you may remove then at night.  °Make sure you keep all of your appointments after your operation with all of your doctors and caregivers. You should call the office at the above phone number and make an appointment for approximately two weeks after the date of your surgery. °Please pick up a stool softener and laxative for home use as long as you are requiring pain medications. °· ICE to the affected hip every three hours for 30 minutes at a time and then as needed for pain and swelling. Continue to use ice on the hip for pain and swelling from surgery. You may notice swelling that will progress down to the foot and ankle.  This is normal after surgery.  Elevate the leg when you are not up walking on it.   °It is important for you to complete the blood thinner medication as prescribed by your doctor. °· Continue to use the breathing machine which will help keep your temperature down.  It is common for your temperature to cycle up and down following surgery, especially at night when you are not up moving around and exerting yourself.  The breathing machine keeps your lungs expanded and your temperature down. ° °RANGE OF MOTION AND STRENGTHENING EXERCISES  °These exercises are   designed to help you keep full movement of your hip joint. Follow your caregiver's or physical therapist's instructions. Perform all exercises about fifteen times, three times per day or as directed. Exercise both hips, even if you have had only one joint replacement. These exercises can be done on a training (exercise) mat, on the floor, on a table or on a bed. Use whatever works the best and is most comfortable for you. Use music or television while you are exercising so that the exercises  are a pleasant break in your day. This will make your life better with the exercises acting as a break in routine you can look forward to.  °Lying on your back, slowly slide your foot toward your buttocks, raising your knee up off the floor. Then slowly slide your foot back down until your leg is straight again.  °Lying on your back spread your legs as far apart as you can without causing discomfort.  °Lying on your side, raise your upper leg and foot straight up from the floor as far as is comfortable. Slowly lower the leg and repeat.  °Lying on your back, tighten up the muscle in the front of your thigh (quadriceps muscles). You can do this by keeping your leg straight and trying to raise your heel off the floor. This helps strengthen the largest muscle supporting your knee.  °Lying on your back, tighten up the muscles of your buttocks both with the legs straight and with the knee bent at a comfortable angle while keeping your heel on the floor.  ° °SKILLED REHAB INSTRUCTIONS: °If the patient is transferred to a skilled rehab facility following release from the hospital, a list of the current medications will be sent to the facility for the patient to continue.  When discharged from the skilled rehab facility, please have the facility set up the patient's Home Health Physical Therapy prior to being released. Also, the skilled facility will be responsible for providing the patient with their medications at time of release from the facility to include their pain medication and their blood thinner medication. If the patient is still at the rehab facility at time of the two week follow up appointment, the skilled rehab facility will also need to assist the patient in arranging follow up appointment in our office and any transportation needs. ° °MAKE SURE YOU:  °Understand these instructions.  °Will watch your condition.  °Will get help right away if you are not doing well or get worse. ° °Pick up stool softner and  laxative for home use following surgery while on pain medications. °Do not remove your dressing. °The dressing is waterproof--it is OK to take showers. °Continue to use ice for pain and swelling after surgery. °Do not use any lotions or creams on the incision until instructed by your surgeon. °Total Hip Protocol. ° ° °

## 2020-11-23 NOTE — Progress Notes (Signed)
Nutrition Follow-up  DOCUMENTATION CODES:   Not applicable  INTERVENTION:   Once diet is advanced, add:  -Ensure Enlive po BID, each supplement provides 350 kcal and 20 grams of protein -MVI with minerals daily  NUTRITION DIAGNOSIS:   Increased nutrient needs related to post-op healing as evidenced by estimated needs.  GOAL:   Patient will meet greater than or equal to 90% of their needs  MONITOR:   PO intake,Supplement acceptance,Diet advancement,Labs,Weight trends,Skin,I & O's  REASON FOR ASSESSMENT:   Consult Assessment of nutrition requirement/status,Hip fracture protocol  ASSESSMENT:   87 year old female with past medical history of dementia, COPD, chronic respiratory failure on 3 L of oxygen via nasal cannula, hypothyroidism, hypertension who presents to [facility] emergency department via EMS from [facility] skilled nursing facility status post fall with right hip pain.  Pt admitted with rt hip fracture s/p fall.   Reviewed I/O's: +642 ml x 24 hours and +142 ml since admission  UOP: 800 ml x 24 hours  Pt unavailable at time of visit. No family at bedside to provide additional history.   Case discussed with RN, who confirmed that pt was in [place] for hemiarthoplasty. Per meal completions records, meal intake 30%. RN reports that pt has teeth, however, needs her medications crushed in order to take them.  Reviewed wt hx; wt has been stable over the past year.   Pt with increased nutritional needs for post-operative healing and would benefit from addition of oral nutrition supplements.   Medications reviewed.   Labs reviewed.   Diet Order:   Diet Order            Diet NPO time specified Except for: Sips with Meds  Diet effective midnight           Diet NPO time specified  Diet effective ____                 EDUCATION NEEDS:   No education needs have been identified at this time  Skin:  Skin Assessment: Reviewed RN Assessment  Last BM:   Unknown  Height:   Ht Readings from Last 1 Encounters:  11/23/20 5\' 6"  (1.676 m)    Weight:   Wt Readings from Last 1 Encounters:  11/23/20 67.6 kg    Ideal Body Weight:  59.1 kg  BMI:  Body mass index is 24.05 kg/m.  Estimated Nutritional Needs:   Kcal:  1700-1900  Protein:  85-100 grams  Fluid:  > 1.7 L    Loistine Chance, RD, LDN, [place] Registered Dietitian II Certified Diabetes Care and Education Specialist Please refer to [facility] for RD and/or RD on-call/weekend/after hours pager

## 2020-11-23 NOTE — Anesthesia Postprocedure Evaluation (Signed)
Anesthesia Post Note  Patient: Dawn Woods  Procedure(s) Performed: ANTERIOR APPROACH HEMI HIP ARTHROPLASTY (Right ) Insertion of Foley Catheter     Patient location during evaluation: PACU Anesthesia Type: Spinal Level of consciousness: patient cooperative and sedated Pain management: pain level controlled Vital Signs Assessment: post-procedure vital signs reviewed and stable Respiratory status: spontaneous breathing, nonlabored ventilation and respiratory function stable Cardiovascular status: blood pressure returned to baseline and stable Postop Assessment: no apparent nausea or vomiting Anesthetic complications: no   No complications documented.  Last Vitals:  Vitals:   11/23/20 1500 11/23/20 1515  BP: (!) 149/70 (!) 143/80  Pulse: 77 72  Resp: 16 14  Temp:    SpO2: 91% 97%    Last Pain:  Vitals:   11/23/20 1500  TempSrc:   PainSc: Asleep                 Alcee Sipos,E. Eulis Salazar

## 2020-11-24 ENCOUNTER — Encounter (HOSPITAL_COMMUNITY): Payer: Self-pay | Admitting: Orthopedic Surgery

## 2020-11-24 LAB — CBC
HCT: 37.7 % (ref 36.0–46.0)
Hemoglobin: 11.9 g/dL — ABNORMAL LOW (ref 12.0–15.0)
MCH: 29.5 pg (ref 26.0–34.0)
MCHC: 31.6 g/dL (ref 30.0–36.0)
MCV: 93.3 fL (ref 80.0–100.0)
Platelets: 222 10*3/uL (ref 150–400)
RBC: 4.04 MIL/uL (ref 3.87–5.11)
RDW: 13.6 % (ref 11.5–15.5)
WBC: 11.5 10*3/uL — ABNORMAL HIGH (ref 4.0–10.5)
nRBC: 0 % (ref 0.0–0.2)

## 2020-11-24 LAB — CULTURE, BLOOD (ROUTINE X 2): Special Requests: ADEQUATE

## 2020-11-24 LAB — BASIC METABOLIC PANEL
Anion gap: 7 (ref 5–15)
BUN: 21 mg/dL (ref 8–23)
CO2: 29 mmol/L (ref 22–32)
Calcium: 7.9 mg/dL — ABNORMAL LOW (ref 8.9–10.3)
Chloride: 103 mmol/L (ref 98–111)
Creatinine, Ser: 0.74 mg/dL (ref 0.44–1.00)
GFR, Estimated: >60 mL/min (ref >60)
Glucose, Bld: 113 mg/dL — ABNORMAL HIGH (ref 70–99)
Potassium: 4.2 mmol/L (ref 3.5–5.1)
Sodium: 139 mmol/L (ref 135–145)

## 2020-11-24 MED ORDER — HYDROCODONE-ACETAMINOPHEN 7.5-325 MG PO TABS
1.0000 | ORAL_TABLET | Freq: Four times a day (QID) | ORAL | Status: DC | PRN
  Administered 2020-11-24 – 2020-11-25 (×3): 1 via ORAL
  Filled 2020-11-24 (×3): qty 1

## 2020-11-24 MED ORDER — ACETAMINOPHEN 325 MG PO TABS: 650.0000 mg | ORAL_TABLET | Freq: Four times a day (QID) | ORAL | Status: DC | PRN

## 2020-11-24 MED ORDER — HYDROCODONE-ACETAMINOPHEN 5-325 MG PO TABS: 2.0000 | ORAL_TABLET | Freq: Four times a day (QID) | ORAL | 0 refills | Status: DC | PRN

## 2020-11-24 MED ORDER — AZITHROMYCIN 500 MG PO TABS
500.0000 mg | ORAL_TABLET | Freq: Every day | ORAL | Status: AC
  Administered 2020-11-24 – 2020-11-25 (×2): 500 mg via ORAL
  Filled 2020-11-24 (×3): qty 1

## 2020-11-24 MED ORDER — CHLORHEXIDINE GLUCONATE CLOTH 2 % EX PADS
6.0000 | MEDICATED_PAD | Freq: Every day | CUTANEOUS | Status: DC
  Administered 2020-11-24 – 2020-11-26 (×3): 6 via TOPICAL

## 2020-11-24 MED ORDER — MORPHINE SULFATE 15 MG PO TABS: 15.0000 mg | ORAL_TABLET | ORAL | Status: DC | PRN

## 2020-11-24 MED ORDER — ENOXAPARIN SODIUM 40 MG/0.4ML ~~LOC~~ SOLN: 40.0000 mg | SUBCUTANEOUS | 0 refills | Status: DC

## 2020-11-24 MED ORDER — ACETAMINOPHEN 500 MG PO TABS
1000.0000 mg | ORAL_TABLET | Freq: Once | ORAL | Status: AC
  Administered 2020-11-25: 1000 mg via ORAL

## 2020-11-24 NOTE — Progress Notes (Signed)
Appropriate Use Committee Chart Review  Chart reviewed by the physician advisor with input from [facility] and the attending MD as needed for review of the appropriateness for SNF referral.  TOC notes, PT/OT/ST notes, nursing notes and physician notes reviewed for medical necessity to determine if the patient's needs are appropriate for short-term rehab to return to a prior level of function versus the likely need for custodial care.  At this time, the patient meets Medicare criteria for SNF placement. From [place] with plan to return there.   Recommendations: The patient is SNF appropriate for short-term rehab/skilled nursing interventions.    Jacquelynn Cree, MD Chief Physician Advisor  11/24/2020 2:49 PM

## 2020-11-24 NOTE — Progress Notes (Signed)
PROGRESS NOTE    Dawn Woods  AOZ:[id number] DOB: 12/07/1929 DOA: 11/21/2020 PCP: Marletta Lor, MD (Inactive)   Brief Narrative:  87 year old female with past medical history of dementia, COPD, chronic respiratory failure on 3 L of oxygen via nasal cannula, hypothyroidism, hypertension who presents to [facility] emergency department via EMS from [facility] skilled nursing facility status post fall with right hip pain. Patient is an extremely poor historian due to advanced dementia. Upon evaluation in the emergency department right hip fracture revealed subcapitellar impacted fracture of the right femoral neck with superior displacement.  CT head revealed no active disease.  Chest x-ray however did reveal a left lower lobe infiltrate.  Case was discussed with Dr. Stann Mainland with orthopedic surgery who stated that orthopedics will evaluate the patient in the morning in consultation with tentative planned surgical intervention on Monday by Dr. Lyla Glassing.  The hospitalist group was then called to assist patient with admission to the hospital.  Assessment & Plan:   Principal Problem:   Closed right hip fracture [facility]) Active Problems:   Hypothyroidism   GERD without esophagitis   Dementia with behavioral disturbance ([place])   Essential hypertension   Pneumonia of left lower lobe due to infectious organism   Fall at home, initial encounter   COPD (chronic obstructive pulmonary disease) ([place])   Chronic respiratory failure with hypoxia ([place])   Foot fracture, right   Closed right hip fracture ([place]) with profound ambulatory dysfunction - Patient has had multiple falls over the past few months, upwards of 5-6 now with new fracture as noted on imaging  - Orthopedic surgery following, appreciate insight recommendations - ORIF 11/23/20 - tolerated well -weightbearing as tolerated with walker, Lovenox SCDs and TED hose for DVT prophylaxis -Foley catheter placed by urology in OR due to  difficulty with placement, will continue until anesthesia and surgery clear - Continue limited and low-dose narcotics as necessary for pain control; avoid oversedation given advanced dementia and age, extremely high risk for sundowning and delirium.  Questionable pneumonia with acute on chronic hypoxic respiratory failure  - Mild leukocytosis resolved - Continue to titrate oxygen to maintain sats 88 to 92%, baseline oxygen is 3 L -continues at home oxygen level without hypoxia - Continue ceftriaxone, azithromycin x5 days  Hypothyroidism, stable - Continue home regimen of Synthroid  GERD without esophagitis - Continue home regimen of PPI  Essential hypertension - Continue home regimen of antihypertensive therapy including amlodipine  COPD (chronic obstructive pulmonary disease) (HCC) with chronic respiratory failure - No evidence of COPD exacerbation this time - Continue home regimen of maintenance inhalers -On 3 L nasal cannula around-the-clock at home  Dementia with behavioral disturbance - Longstanding history of advanced dementia - Depakote - for behavoiral issues with other patients - Continuing to achieve pain control with as needed analgesics with smallest doses possible to minimize worsening confusion - Fall precautions - Frequent redirection of patient -Patient's daughter is available nearby for visiting if necessary to help reorient patient  Goals of care : Patient assessed at her assisted living facility for skilled care as of July 2021, at that time the facility did not think the patient would meet criteria for skilled care but given her multiple falls recently and new fracture family will have facility reevaluate upon discharge back to their care.    DVT prophylaxis: Lovenox Code Status: DNR Family Communication: Care plan discussed at length with son Lissa Merlin) and daughter-in-law over the phone at length -daughter at bedside, discussed goals,  disposition plan and  prognosis.  Status is: Inpatient  Dispo: The patient is from: Facility              Anticipated d/c is to: Same              Anticipated d/c date is: 48 to 72 hours pending clinical course and surgery schedule              Patient currently not medically stable for discharge  Consultants:   Orthopedic surgery  Procedures:   ORIF 11/23/2020  Antimicrobials:  Azithromycin, ceftriaxone x5 days  Subjective: No acute issues or events overnight, tolerated procedure quite well.  Review of systems markedly limited given patient's baseline dementia.  Objective: Vitals:   11/23/20 1845 11/23/20 2016 11/24/20 0106 11/24/20 0539  BP: 133/67 135/70 (!) 142/69 (!) 141/72  Pulse: 76 74 74 69  Resp: 17 19 19 19   Temp: 98.2 F (36.8 C) 97.7 F (36.5 C) 97.6 F (36.4 C) 98 F (36.7 C)  TempSrc: Oral Oral Oral Oral  SpO2:      Weight:      Height:        Intake/Output Summary (Last 24 hours) at 11/24/2020 0742 Last data filed at 11/23/2020 2100 Gross per 24 hour  Intake 762.67 ml  Output 2100 ml  Net -1337.33 ml   Filed Weights   11/23/20 1133  Weight: 67.6 kg    Examination:  General:  Pleasantly resting in bed, No acute distress.  Alert to person only HEENT:  Normocephalic atraumatic.  Sclerae nonicteric, noninjected.  Extraocular movements intact bilaterally. Neck:  Without mass or deformity.  Trachea is midline. Lungs:  Clear to auscultate bilaterally without rhonchi, wheeze, or rales. Heart:  Regular rate and rhythm.  Without murmurs, rubs, or gallops. Abdomen:  Soft, nontender, nondistended.  Without guarding or rebound. Extremities: Without cyanosis, clubbing, edema, or obvious deformity. Vascular:  Dorsalis pedis and posterior tibial pulses palpable bilaterally. Skin:  Warm and dry, no erythema, no ulcerations.  Data Reviewed: I have personally reviewed following labs and imaging studies  CBC: Recent Labs  Lab 11/21/20 1805 11/23/20 0542 11/24/20 0207  WBC  10.8* 9.2 11.5*  NEUTROABS 9.4*  --   --   HGB 13.6 13.8 11.9*  HCT 43.4 42.7 37.7  MCV 92.9 91.6 93.3  PLT 216 199 443   Basic Metabolic Panel: Recent Labs  Lab 11/21/20 1805 11/23/20 0542 11/24/20 0207  NA 140 140 139  K 4.0 3.6 4.2  CL 103 103 103  CO2 26 26 29   GLUCOSE 134* 99 113*  BUN 22 13 21   CREATININE 0.66 0.60 0.74  CALCIUM 8.9 8.8* 7.9*   GFR: Estimated Creatinine Clearance: 45.5 mL/min (by C-G formula based on SCr of 0.74 mg/dL). Liver Function Tests: No results for input(s): AST, ALT, ALKPHOS, BILITOT, PROT, ALBUMIN in the last 168 hours. No results for input(s): LIPASE, AMYLASE in the last 168 hours. No results for input(s): AMMONIA in the last 168 hours. Coagulation Profile: Recent Labs  Lab 11/21/20 1805  INR 1.0   Cardiac Enzymes: No results for input(s): CKTOTAL, CKMB, CKMBINDEX, TROPONINI in the last 168 hours. BNP (last 3 results) No results for input(s): PROBNP in the last 8760 hours. HbA1C: Recent Labs    11/22/20 0615  HGBA1C 5.4   CBG: No results for input(s): GLUCAP in the last 168 hours. Lipid Profile: No results for input(s): CHOL, HDL, LDLCALC, TRIG, CHOLHDL, LDLDIRECT in the last 72 hours. Thyroid Function Tests:  No results for input(s): TSH, T4TOTAL, FREET4, T3FREE, THYROIDAB in the last 72 hours. Anemia Panel: No results for input(s): VITAMINB12, FOLATE, FERRITIN, TIBC, IRON, RETICCTPCT in the last 72 hours. Sepsis Labs: Recent Labs  Lab 11/22/20 0615  PROCALCITON <0.10  LATICACIDVEN 1.3    Recent Results (from the past 240 hour(s))  Resp Panel by RT-PCR (Flu A&B, Covid) Nasopharyngeal Swab     Status: None   Collection Time: 11/21/20  6:12 PM   Specimen: Nasopharyngeal Swab; Nasopharyngeal(NP) swabs in vial transport medium  Result Value Ref Range Status   SARS Coronavirus 2 by RT PCR NEGATIVE NEGATIVE Final    Comment: (NOTE) SARS-CoV-2 target nucleic acids are NOT DETECTED.  The SARS-CoV-2 RNA is generally  detectable in upper respiratory specimens during the acute phase of infection. The lowest concentration of SARS-CoV-2 viral copies this assay can detect is 138 copies/mL. A negative result does not preclude SARS-Cov-2 infection and should not be used as the sole basis for treatment or other patient management decisions. A negative result may occur with  improper specimen collection/handling, submission of specimen other than nasopharyngeal swab, presence of viral mutation(s) within the areas targeted by this assay, and inadequate number of viral copies(<138 copies/mL). A negative result must be combined with clinical observations, patient history, and epidemiological information. The expected result is Negative.  Fact Sheet for Patients:  EntrepreneurPulse.com.au  Fact Sheet for Healthcare Providers:  IncredibleEmployment.be  This test is no t yet approved or cleared by the [place] FDA and  has been authorized for detection and/or diagnosis of SARS-CoV-2 by FDA under an Emergency Use Authorization (EUA). This EUA will remain  in effect (meaning this test can be used) for the duration of the COVID-19 declaration under Section 564(b)(1) of the Act, 21 U.S.C.section 360bbb-3(b)(1), unless the authorization is terminated  or revoked sooner.       Influenza A by PCR NEGATIVE NEGATIVE Final   Influenza B by PCR NEGATIVE NEGATIVE Final    Comment: (NOTE) The Xpert Xpress SARS-CoV-2/FLU/RSV plus assay is intended as an aid in the diagnosis of influenza from Nasopharyngeal swab specimens and should not be used as a sole basis for treatment. Nasal washings and aspirates are unacceptable for Xpert Xpress SARS-CoV-2/FLU/RSV testing.  Fact Sheet for Patients: EntrepreneurPulse.com.au  Fact Sheet for Healthcare Providers: IncredibleEmployment.be  This test is not yet approved or cleared by the [place] FDA  and has been authorized for detection and/or diagnosis of SARS-CoV-2 by FDA under an Emergency Use Authorization (EUA). This EUA will remain in effect (meaning this test can be used) for the duration of the COVID-19 declaration under Section 564(b)(1) of the Act, 21 U.S.C. section 360bbb-3(b)(1), unless the authorization is terminated or revoked.  Performed at [facility], [place] [address], [place], [place] [address]   Culture, blood (routine x 2)     Status: Abnormal (Preliminary result)   Collection Time: 11/21/20  8:55 PM   Specimen: BLOOD  Result Value Ref Range Status   Specimen Description BLOOD RIGHT HAND  Final   Special Requests   Final    BOTTLES DRAWN AEROBIC AND ANAEROBIC Blood Culture adequate volume   Culture  Setup Time   Final    GRAM POSITIVE COCCI IN CLUSTERS GRAM POSITIVE COCCI IN CHAINS AEROBIC BOTTLE ONLY CRITICAL RESULT CALLED TO, READ BACK BY AND VERIFIED WITH: PHARMD M [place] [id number] AT 1540 BY CM    Culture (A)  Final    STREPTOCOCCUS MITIS/ORALIS CULTURE REINCUBATED FOR  BETTER GROWTH Performed at [facility], [place] [address], [place], [place] [address]    Report Status PENDING  Incomplete  Blood Culture ID Panel (Reflexed)     Status: Abnormal   Collection Time: 11/21/20  8:55 PM  Result Value Ref Range Status   Enterococcus faecalis NOT DETECTED NOT DETECTED Final   Enterococcus Faecium NOT DETECTED NOT DETECTED Final   Listeria monocytogenes NOT DETECTED NOT DETECTED Final   Staphylococcus species DETECTED (A) NOT DETECTED Final    Comment: CRITICAL RESULT CALLED TO, READ BACK BY AND VERIFIED WITH: PHARMD M MACCIA [id number] AT 1613 BY CM    Staphylococcus aureus (BCID) NOT DETECTED NOT DETECTED Final   Staphylococcus epidermidis NOT DETECTED NOT DETECTED Final   Staphylococcus lugdunensis NOT DETECTED NOT DETECTED Final   Streptococcus species DETECTED (A) NOT DETECTED Final    Comment: Not Enterococcus species, Streptococcus agalactiae,  Streptococcus pyogenes, or Streptococcus pneumoniae. CRITICAL RESULT CALLED TO, READ BACK BY AND VERIFIED WITH: PHARMD M [place] [id number] AT 40 BY CM    Streptococcus agalactiae NOT DETECTED NOT DETECTED Final   Streptococcus pneumoniae NOT DETECTED NOT DETECTED Final   Streptococcus pyogenes NOT DETECTED NOT DETECTED Final   A.calcoaceticus-baumannii NOT DETECTED NOT DETECTED Final   Bacteroides fragilis NOT DETECTED NOT DETECTED Final   Enterobacterales NOT DETECTED NOT DETECTED Final   Enterobacter cloacae complex NOT DETECTED NOT DETECTED Final   Escherichia coli NOT DETECTED NOT DETECTED Final   Klebsiella aerogenes NOT DETECTED NOT DETECTED Final   Klebsiella oxytoca NOT DETECTED NOT DETECTED Final   Klebsiella pneumoniae NOT DETECTED NOT DETECTED Final   Proteus species NOT DETECTED NOT DETECTED Final   Salmonella species NOT DETECTED NOT DETECTED Final   Serratia marcescens NOT DETECTED NOT DETECTED Final   Haemophilus influenzae NOT DETECTED NOT DETECTED Final   Neisseria meningitidis NOT DETECTED NOT DETECTED Final   Pseudomonas aeruginosa NOT DETECTED NOT DETECTED Final   Stenotrophomonas maltophilia NOT DETECTED NOT DETECTED Final   Candida albicans NOT DETECTED NOT DETECTED Final   Candida auris NOT DETECTED NOT DETECTED Final   Candida glabrata NOT DETECTED NOT DETECTED Final   Candida krusei NOT DETECTED NOT DETECTED Final   Candida parapsilosis NOT DETECTED NOT DETECTED Final   Candida tropicalis NOT DETECTED NOT DETECTED Final   Cryptococcus neoformans/gattii NOT DETECTED NOT DETECTED Final    Comment: Performed at [facility], [address], [place], [place] [address]  Culture, blood (routine x 2)     Status: None (Preliminary result)   Collection Time: 11/22/20  6:22 AM   Specimen: BLOOD  Result Value Ref Range Status   Specimen Description BLOOD LEFT ANTECUBITAL  Final   Special Requests AEROBIC BOTTLE ONLY Blood Culture adequate volume  Final   Culture    Final    NO GROWTH 1 DAY Performed at [facility], [place] [address] [place], [place] [address]    Report Status PENDING  Incomplete  MRSA PCR Screening     Status: None   Collection Time: 11/22/20 10:55 AM   Specimen: Nasal Mucosa; Nasopharyngeal  Result Value Ref Range Status   MRSA by PCR NEGATIVE NEGATIVE Final    Comment:        The GeneXpert MRSA Assay (FDA approved for NASAL specimens only), is one component of a comprehensive MRSA colonization surveillance program. It is not intended to diagnose MRSA infection nor to guide or monitor treatment for MRSA infections. Performed at [facility],  [address] [place], [place] [address]          Radiology Studies: Pelvis Portable  Result Date: 11/23/2020 CLINICAL DATA:  Closed displaced fracture RIGHT femoral neck EXAM: PORTABLE PELVIS 1-2 VIEWS at COMPARISON:  Portable exam 1454 hours compared to 11/23/2020 and 11/21/2020 FINDINGS: Interval resection of RIGHT femoral head/neck and placement of a femoral hip prosthesis. LEFT hip joint space preserved. No fracture, dislocation, or bone destruction. IMPRESSION: RIGHT hip prosthesis without acute complication. Electronically Signed   By: Lavonia Dana M.D.   On: 11/23/2020 15:59   DG Knee Right Port  Result Date: 11/22/2020 CLINICAL DATA:  Femoral fracture EXAM: PORTABLE RIGHT KNEE - 1-2 VIEW COMPARISON:  November 21, 2020 FINDINGS: Osteopenia. No acute fracture or dislocation. Mild degenerative changes of the medial compartment. No area of erosion or osseous destruction. No unexpected radiopaque foreign body. Vascular calcifications. IMPRESSION: No acute fracture or dislocation of the right knee. Electronically Signed   By: Valentino Saxon MD   On: 11/22/2020 09:05   DG C-Arm 1-60 Min  Result Date: 11/23/2020 CLINICAL DATA:  Hemiarthroplasty. EXAM: OPERATIVE RIGHT HIP (WITH PELVIS IF PERFORMED) 2 VIEWS TECHNIQUE: Fluoroscopic spot image(s) were submitted for  interpretation post-operatively. COMPARISON:  November 21, 2020. FINDINGS: Fluoro time: 5 seconds. Reported radiation: 0.30 mGy. Two C-arm fluoroscopic images were obtained intraoperatively and submitted for post operative interpretation. These images demonstrate postsurgical changes of right hemiarthroplasty. No unexpected findings. Please see the performing provider's procedural report for further detail. IMPRESSION: Intraoperative fluoroscopy, as detailed above. Electronically Signed   By: Margaretha Sheffield MD   On: 11/23/2020 14:20   DG HIP OPERATIVE UNILAT W OR W/O PELVIS RIGHT  Result Date: 11/23/2020 CLINICAL DATA:  Hemiarthroplasty. EXAM: OPERATIVE RIGHT HIP (WITH PELVIS IF PERFORMED) 2 VIEWS TECHNIQUE: Fluoroscopic spot image(s) were submitted for interpretation post-operatively. COMPARISON:  November 21, 2020. FINDINGS: Fluoro time: 5 seconds. Reported radiation: 0.30 mGy. Two C-arm fluoroscopic images were obtained intraoperatively and submitted for post operative interpretation. These images demonstrate postsurgical changes of right hemiarthroplasty. No unexpected findings. Please see the performing provider's procedural report for further detail. IMPRESSION: Intraoperative fluoroscopy, as detailed above. Electronically Signed   By: Margaretha Sheffield MD   On: 11/23/2020 14:20   Scheduled Meds: . amLODipine  10 mg Oral q morning  . divalproex  125 mg Oral Q12H  . docusate sodium  100 mg Oral BID  . enoxaparin (LOVENOX) injection  40 mg Subcutaneous Q24H  . escitalopram  15 mg Oral q morning  . feeding supplement  237 mL Oral BID BM  . fluticasone furoate-vilanterol  1 puff Inhalation Daily  . guaiFENesin  1,200 mg Oral BID  . levothyroxine  112 mcg Oral q morning  . loratadine  10 mg Oral q morning  . multivitamin with minerals  1 tablet Oral Daily  . pantoprazole  40 mg Oral q morning  . triamcinolone  4 spray Nasal QHS   Continuous Infusions: . azithromycin 500 mg (11/23/20 2209)   . cefTRIAXone (ROCEPHIN)  IV 2 g (11/23/20 2113)     LOS: 2 days   Time spent: 22min  Niva Murren C Amitai Delaughter, DO [facility]  If 7PM-7AM, please contact night-coverage [email or web address]  11/24/2020, 7:42 AM

## 2020-11-24 NOTE — Evaluation (Signed)
Physical Therapy Evaluation Patient Details Name: Dawn Woods MRN: [id number] DOB: [birth year] Today's Date: 11/24/2020   History of Present Illness  87 year old female with past medical history of dementia, COPD, chronic respiratory failure on 3 L of oxygen via nasal cannula, hypothyroidism, hypertension who presents to [facility] emergency department via EMS from [facility] skilled nursing facility status post fall with right hip pain. pt underwent R anterior approach hemiarthroplasy as surgical intervention on 11/23/20.  Clinical Impression   Patient received in bed, oriented to self only. Required maxAx2 to get to/from EOB due to pain as well as limited initiation with mobility as well as cognitive status today. Able to sit at EOB with min guard perhaps 30 seconds before laying her trunk back down and needed physical assist to get back in bed. Positioned to comfort and left in bed with all needs met, bed alarm active. Discussed case with RN as well as Enid Derry, her son and POA who is very supportive and agreeable to return to [facility] for SNF level care/rehab after this fall.     Follow Up Recommendations SNF;Supervision/Assistance - 24 hour    Equipment Recommendations  Rolling walker with 5" wheels;3in1 (PT);Wheelchair (measurements PT);Wheelchair cushion (measurements PT)    Recommendations for Other Services       Precautions / Restrictions Precautions Precautions: Fall Precaution Comments: fall, dementia at baseline, anterior approach R hip Restrictions Weight Bearing Restrictions: No Other Position/Activity Restrictions: WBAT R LE      Mobility  Bed Mobility Overal bed mobility: Needs Assistance Bed Mobility: Supine to Sit;Sit to Supine     Supine to sit: Max assist;+2 for physical assistance Sit to supine: Max assist;+2 for physical assistance   General bed mobility comments: maxAx2 for safe mobility to/from EOB, also due to patient not really  initiating moveemnt and guarding due to pain    Transfers                 General transfer comment: refused  Ambulation/Gait             General Gait Details: refused  Stairs            Wheelchair Mobility    Modified Rankin (Stroke Patients Only)       Balance Overall balance assessment: Needs assistance Sitting-balance support: Feet supported Sitting balance-Leahy Scale: Fair Sitting balance - Comments: Min guard once at EOB statically       Standing balance comment: unable to assess at eval                             Pertinent Vitals/Pain Pain Assessment: Faces Faces Pain Scale: Hurts whole lot Pain Location: during bed mobility to sit EOB Pain Descriptors / Indicators: Grimacing;Guarding;Moaning Pain Intervention(s): Limited activity within patient's tolerance;Monitored during session;Repositioned    Home Living Family/patient expects to be discharged to:: Assisted living (memory support)               Home Equipment: Environmental consultant - 4 wheels (rollater) Additional Comments: pt is a poor historian and unable to provide PLOF info. Spoke with SNF for PLOF info below    Prior Function Level of Independence: Needs assistance   Gait / Transfers Assistance Needed: uses rolater  ADL's / Homemaking Assistance Needed: Supervison with cues dor sequencing for ADLs, Dep with TED hose, wear O2 but will not keep it on, assist with toileting        Hand Dominance  Dominant Hand: Right    Extremity/Trunk Assessment   Upper Extremity Assessment Upper Extremity Assessment: Defer to OT evaluation    Lower Extremity Assessment Lower Extremity Assessment: Generalized weakness    Cervical / Trunk Assessment Cervical / Trunk Assessment: Kyphotic  Communication   Communication: No difficulties  Cognition Arousal/Alertness: Awake/alert Behavior During Therapy: Flat affect Overall Cognitive Status: No family/caregiver present to  determine baseline cognitive functioning                                 General Comments: hx of advanced dementia. has memory support assist at ALF. Pt unable to state where she lives , unaware that she has fallen and broken her hip, ha dsurgery to repair and repeatedly asking why her R leg hurts and why she is here/what we are doing and why we have to do it      General Comments      Exercises     Assessment/Plan    PT Assessment Patient needs continued PT services  PT Problem List Decreased strength;Decreased cognition;Decreased knowledge of use of DME;Decreased activity tolerance;Decreased safety awareness;Decreased balance;Pain;Decreased mobility;Decreased coordination       PT Treatment Interventions DME instruction;Balance training;Gait training;Patient/family education;Functional mobility training;Therapeutic activities;Therapeutic exercise;Wheelchair mobility training    PT Goals (Current goals can be found in the Care Plan section)  Acute Rehab PT Goals Patient Stated Goal: go to rehab wing of [facility] PT Goal Formulation: With family Time For Goal Achievement: 12/08/20 Potential to Achieve Goals: Good    Frequency Min 3X/week   Barriers to discharge        Co-evaluation               AM-PAC PT "6 Clicks" Mobility  Outcome Measure Help needed turning from your back to your side while in a flat bed without using bedrails?: A Lot Help needed moving from lying on your back to sitting on the side of a flat bed without using bedrails?: Total Help needed moving to and from a bed to a chair (including a wheelchair)?: Total Help needed standing up from a chair using your arms (e.g., wheelchair or bedside chair)?: Total Help needed to walk in hospital room?: Total Help needed climbing 3-5 steps with a railing? : Total 6 Click Score: 7    End of Session Equipment Utilized During Treatment: Oxygen Activity Tolerance: Patient tolerated treatment  well;Patient limited by pain Patient left: in bed;with call bell/phone within reach;with bed alarm set Nurse Communication: Mobility status PT Visit Diagnosis: Unsteadiness on feet (R26.81);Difficulty in walking, not elsewhere classified (R26.2);History of falling (Z91.81);Muscle weakness (generalized) (M62.81)    Time: 5300-5110 PT Time Calculation (min) (ACUTE ONLY): 14 min   Charges:   PT Evaluation $PT Eval Moderate Complexity: 1 Mod          Windell Norfolk, DPT, PN1   Supplemental Physical Therapist [place]    Pager [phone] Acute Rehab Office [phone]

## 2020-11-24 NOTE — Progress Notes (Signed)
    Subjective:  Patient is resting in bed. NT is feeding patient at time of exam. Patient does not answer questions during exam but does follow commands. No events reported overnight  Objective:   VITALS:   Vitals:   11/23/20 1845 11/23/20 2016 11/24/20 0106 11/24/20 0539  BP: 133/67 135/70 (!) 142/69 (!) 141/72  Pulse: 76 74 74 69  Resp: 17 19 19 19   Temp: 98.2 F (36.8 C) 97.7 F (36.5 C) 97.6 F (36.4 C) 98 F (36.7 C)  TempSrc: Oral Oral Oral Oral  SpO2:      Weight:      Height:        NAD ABD soft Neurovascular intact Sensation intact distally Intact pulses distally Dorsiflexion/Plantar flexion intact Incision: dressing C/D/I   Lab Results  Component Value Date   WBC 11.5 (H) 11/24/2020   HGB 11.9 (L) 11/24/2020   HCT 37.7 11/24/2020   MCV 93.3 11/24/2020   PLT 222 11/24/2020   BMET    Component Value Date/Time   NA 139 11/24/2020 0207   NA 141 12/01/2015 0000   K 4.2 11/24/2020 0207   CL 103 11/24/2020 0207   CO2 29 11/24/2020 0207   GLUCOSE 113 (H) 11/24/2020 0207   BUN 21 11/24/2020 0207   BUN 17 12/01/2015 0000   CREATININE 0.74 11/24/2020 0207   CALCIUM 7.9 (L) 11/24/2020 0207   GFRNONAA >60 11/24/2020 0207   GFRAA >60 10/21/2016 0558     Assessment/Plan: 1 Day Post-Op   Principal Problem:   Closed right hip fracture (HCC) Active Problems:   Hypothyroidism   GERD without esophagitis   Dementia with behavioral disturbance (HCC)   Essential hypertension   Pneumonia of left lower lobe due to infectious organism   Fall at home, initial encounter   COPD (chronic obstructive pulmonary disease) ([place])   Chronic respiratory failure with hypoxia ([place])   Foot fracture, right   WBAT with walker DVT ppx: Lovenox, SCDs, TEDS      PO pain control PT/OT Dispo: D/C planning     Dorothyann Peng 11/24/2020, 8:28 AM [facility] is now [address], [place], [place] [address] Phone:  [phone] [email or web address] Facebook  [employer]

## 2020-11-24 NOTE — Evaluation (Signed)
Occupational Therapy Evaluation Patient Details Name: Dawn Woods MRN: [id number] DOB: 1930/02/14 Today's Date: 11/24/2020    History of Present Illness 87 year old female with past medical history of dementia, COPD, chronic respiratory failure on 3 L of oxygen via nasal cannula, hypothyroidism, hypertension who presents to [facility] emergency department via EMS from [facility] skilled nursing facility status post fall with right hip pain. pt underwent R anterior approach hemiarthroplasy as surgical intervention on 11/23/20.   Clinical Impression   Pt presents with decline in function and safety with ADLs and ADL mobility with impaired strength, balance and endurance. Pt with hx dementia and lives at [facility] on ALF unit with memory support care. Pt used a rollater for mobility, required assist with toileting, dep with TED hose and Sup with all other ADLs/selfcare tasks. Pt currently requires max A to sit EOB, mod A with UB selfcare, max - total A with LB selfcare and toileting. Pt requires cues for initiation and carryover of tasks and with Poor STM/safety awareness. Pt would benefit from acute OT services to address impairments to maximize level of function and safety    Follow Up Recommendations  SNF;Supervision/Assistance - 24 hour    Equipment Recommendations  3 in 1 bedside commode    Recommendations for Other Services       Precautions / Restrictions Precautions Precautions: Fall Precaution Comments: fall, dementia at baseline, anterior approach R hip Restrictions Weight Bearing Restrictions: No Other Position/Activity Restrictions: WBAT R LE      Mobility Bed Mobility Overal bed mobility: Needs Assistance Bed Mobility: Supine to Sit;Sit to Supine     Supine to sit: Max assist;+2 for physical assistance Sit to supine: Max assist;+2 for physical assistance   General bed mobility comments: maxAx2 for safe mobility to/from EOB, also due to patient not really  initiating moveemnt and guarding due to pain    Transfers                 General transfer comment: refused    Balance Overall balance assessment: Needs assistance Sitting-balance support: Feet supported Sitting balance-Leahy Scale: Fair Sitting balance - Comments: Min guard once at EOB statically       Standing balance comment: unable to assess at eval                           ADL either performed or assessed with clinical judgement   ADL Overall ADL's : Needs assistance/impaired Eating/Feeding: Set up;Supervision/ safety;Sitting;Bed level   Grooming: Wash/dry hands;Wash/dry face;Min guard;Sitting   Upper Body Bathing: Moderate assistance;Sitting   Lower Body Bathing: Maximal assistance;Sitting/lateral leans   Upper Body Dressing : Moderate assistance;Sitting   Lower Body Dressing: Total assistance     Toilet Transfer Details (indicate cue type and reason): unable to attempt at this time Toileting- Clothing Manipulation and Hygiene: Total assistance;Bed level         General ADL Comments: pt required mod verbal cues to initiate tasks with min physical cues to initiate. Pt asking to urinate in bathroom multiple times although she had been shown her foley and explained its purpose     Vision Baseline Vision/History: Wears glasses (pt reports that she does not wear glasses)       Perception     Praxis      Pertinent Vitals/Pain Pain Assessment: Faces Faces Pain Scale: Hurts whole lot Pain Location: during bed mobility to sit EOB Pain Descriptors / Indicators: Grimacing;Guarding;Moaning Pain Intervention(s): Limited activity  within patient's tolerance;Monitored during session;Repositioned     Hand Dominance Right   Extremity/Trunk Assessment Upper Extremity Assessment Upper Extremity Assessment: Defer to OT evaluation   Lower Extremity Assessment Lower Extremity Assessment: Generalized weakness   Cervical / Trunk Assessment Cervical  / Trunk Assessment: Kyphotic   Communication Communication Communication: No difficulties   Cognition Arousal/Alertness: Awake/alert Behavior During Therapy: Flat affect Overall Cognitive Status: No family/caregiver present to determine baseline cognitive functioning                                 General Comments: hx of advanced dementia. has memory support assist at ALF. Pt unable to state where she lives , unaware that she has fallen and broken her hip, ha dsurgery to repair and repeatedly asking why her R leg hurts and why she is here/what we are doing and why we have to do it   General Comments       Exercises     Shoulder Instructions      Home Living Family/patient expects to be discharged to:: Assisted living (memory support)                             Home Equipment: Walker - 4 wheels (rollater)   Additional Comments: pt is a poor historian and unable to provide PLOF info. Spoke with SNF for PLOF info below      Prior Functioning/Environment Level of Independence: Needs assistance  Gait / Transfers Assistance Needed: uses rolater ADL's / Homemaking Assistance Needed: Supervison with cues dor sequencing for ADLs, Dep with TED hose, wear O2 but will not keep it on, assist with toileting            OT Problem List: Decreased strength;Impaired balance (sitting and/or standing);Decreased cognition;Decreased knowledge of precautions;Pain;Decreased safety awareness;Decreased activity tolerance;Decreased knowledge of use of DME or AE      OT Treatment/Interventions: Self-care/ADL training;DME and/or AE instruction;Therapeutic activities;Balance training;Patient/family education    OT Goals(Current goals can be found in the care plan section) Acute Rehab OT Goals Patient Stated Goal: go to rehab wing of [facility] OT Goal Formulation: With patient Time For Goal Achievement: 12/08/20 Potential to Achieve Goals: Good ADL Goals Pt Will Perform  Grooming: with set-up;with supervision;sitting Pt Will Perform Upper Body Bathing: with supervision;with set-up;sitting Pt Will Perform Lower Body Bathing: with mod assist;sitting/lateral leans Pt Will Perform Upper Body Dressing: with supervision;with set-up;sitting Pt Will Transfer to Toilet: with max assist;with mod assist;stand pivot transfer;bedside commode  OT Frequency: Min 2X/week   Barriers to D/C:            Co-evaluation              AM-PAC OT "6 Clicks" Daily Activity     Outcome Measure Help from another person eating meals?: None Help from another person taking care of personal grooming?: A Little Help from another person toileting, which includes using toliet, bedpan, or urinal?: Total Help from another person bathing (including washing, rinsing, drying)?: A Lot Help from another person to put on and taking off regular upper body clothing?: A Lot Help from another person to put on and taking off regular lower body clothing?: Total 6 Click Score: 13   End of Session    Activity Tolerance: No increased pain;Patient limited by fatigue Patient left: in bed;with call bell/phone within reach;with bed alarm set  OT Visit Diagnosis:  Other abnormalities of gait and mobility (R26.89);Muscle weakness (generalized) (M62.81);History of falling (Z91.81);Other symptoms and signs involving cognitive function;Pain Pain - Right/Left: Right Pain - part of body: Hip;Leg                Time:  -    Charges:  OT General Charges $OT Visit: 1 Visit OT Evaluation $OT Eval Moderate Complexity: 1 Mod OT Treatments $Self Care/Home Management : 8-22 mins    Britt Bottom 11/24/2020, 3:24 PM

## 2020-11-24 NOTE — TOC Progression Note (Signed)
Transition of Care [facility]) - Progression Note    Patient Details  Name: Dawn Woods MRN: [id number] Date of Birth: 17-Dec-1929  Transition of Care [facility]) CM/SW [place], [place] Phone Number: 11/24/2020, 1:31 PM  Clinical Narrative:    CSW received call from [place] at [facility] who advised that they would have a bed available if the patient needed, at their SNF. PT/OT have not seen this pt yet, TOC will follow for disposition. Dtr noted in a previous conversation she would want pt to go to [facility] if SNF is necessary.         Expected Discharge Plan and Services                                                 Social Determinants of Health (SDOH) Interventions    Readmission Risk Interventions No flowsheet data found.

## 2020-11-25 ENCOUNTER — Encounter (HOSPITAL_COMMUNITY): Payer: Self-pay | Admitting: Orthopedic Surgery

## 2020-11-25 DIAGNOSIS — J189 Pneumonia, unspecified organism: Secondary | ICD-10-CM

## 2020-11-25 DIAGNOSIS — E039 Hypothyroidism, unspecified: Secondary | ICD-10-CM

## 2020-11-25 DIAGNOSIS — J9611 Chronic respiratory failure with hypoxia: Secondary | ICD-10-CM

## 2020-11-25 DIAGNOSIS — S72001D Fracture of unspecified part of neck of right femur, subsequent encounter for closed fracture with routine healing: Secondary | ICD-10-CM

## 2020-11-25 DIAGNOSIS — J449 Chronic obstructive pulmonary disease, unspecified: Secondary | ICD-10-CM

## 2020-11-25 DIAGNOSIS — K219 Gastro-esophageal reflux disease without esophagitis: Secondary | ICD-10-CM

## 2020-11-25 DIAGNOSIS — I10 Essential (primary) hypertension: Secondary | ICD-10-CM

## 2020-11-25 DIAGNOSIS — F0151 Vascular dementia with behavioral disturbance: Secondary | ICD-10-CM

## 2020-11-25 LAB — CBC
HCT: 37.1 % (ref 36.0–46.0)
Hemoglobin: 12 g/dL (ref 12.0–15.0)
MCH: 30.4 pg (ref 26.0–34.0)
MCHC: 32.3 g/dL (ref 30.0–36.0)
MCV: 93.9 fL (ref 80.0–100.0)
Platelets: 194 10*3/uL (ref 150–400)
RBC: 3.95 MIL/uL (ref 3.87–5.11)
RDW: 13.6 % (ref 11.5–15.5)
WBC: 8.5 10*3/uL (ref 4.0–10.5)
nRBC: 0 % (ref 0.0–0.2)

## 2020-11-25 LAB — SARS CORONAVIRUS 2 (TAT 6-24 HRS): SARS Coronavirus 2: NEGATIVE

## 2020-11-25 NOTE — NC FL2 (Signed)
[place] MEDICAID FL2 LEVEL OF CARE SCREENING TOOL     IDENTIFICATION  Patient Name: Dawn Woods Birthdate: 08-May-1930 Sex: female Admission Date (Current Location): 11/21/2020  [facility] and [place] Number:  Herbalist and Address:  [facility] [place] [address] [place], [place] [address]      Provider Number: [id number]  Attending Physician Name and Address:  Murlean Iba, MD  Relative Name and Phone Number:       Current Level of Care: Hospital Recommended Level of Care: [place] Prior Approval Number:    Date Approved/Denied:   PASRR Number: [id number]  Discharge Plan: SNF    Current Diagnoses: Patient Active Problem List   Diagnosis Date Noted  . Foot fracture, right 11/22/2020  . Closed right hip fracture ([place]) 11/21/2020  . Essential hypertension 11/21/2020  . Pneumonia of left lower lobe due to infectious organism 11/21/2020  . Fall at home, initial encounter 11/21/2020  . COPD (chronic obstructive pulmonary disease) ([place]) 11/21/2020  . Chronic respiratory failure with hypoxia ([place]) 11/21/2020  . Confusion 10/15/2016  . Elevated troponin 10/15/2016  . Bronchitis 12/02/2015  . Left leg weakness 12/02/2015  . Dementia with behavioral disturbance ([place]) 12/02/2015  . Hypertrophic obstructive cardiomyopathy ([place]) 12/02/2015  . Depression 12/02/2015  . Malnutrition of moderate degree 11/26/2015  . Acute respiratory failure with hypoxia ([place]) 11/26/2015  . Acute encephalopathy 11/24/2015  . Hypokalemia 11/24/2015  . Dehydration 11/24/2015  . Generalized weakness 11/24/2015  . Lower urinary tract infectious disease   . BPV (benign positional vertigo) 08/25/2014  . Melena 08/05/2013  . Personal history of colonic polyps 08/05/2013  . Urinary tract infection 05/19/2010  . Hypothyroidism 04/21/2008  . Dyslipidemia 04/21/2008  . GERD without esophagitis 04/21/2008    Orientation RESPIRATION BLADDER  Height & Weight     Self  O2 Incontinent Weight: 149 lb 0.5 oz (67.6 kg) Height:  5\' 6"  (167.6 cm)  BEHAVIORAL SYMPTOMS/MOOD NEUROLOGICAL BOWEL NUTRITION STATUS      Incontinent    AMBULATORY STATUS COMMUNICATION OF NEEDS Skin   Extensive Assist Verbally Surgical wounds                       Personal Care Assistance Level of Assistance  Bathing,Dressing Bathing Assistance: Limited assistance   Dressing Assistance: Limited assistance     Functional Limitations Info  Sight,Hearing,Speech Sight Info: Impaired Hearing Info: Adequate Speech Info: Adequate    SPECIAL CARE FACTORS FREQUENCY  PT (By licensed PT),OT (By licensed OT)                    Contractures      Additional Factors Info  Code Status Code Status Info: DNR             Current Medications (11/25/2020):  This is the current hospital active medication list Current Facility-Administered Medications  Medication Dose Route Frequency Provider Last Rate Last Admin  . acetaminophen (TYLENOL) tablet 650 mg  650 mg Oral Q6H PRN Cherlynn June B, PA      . albuterol (VENTOLIN HFA) 108 (90 Base) MCG/ACT inhaler 2 puff  2 puff Inhalation Q6H PRN Swinteck, Aaron Edelman, MD      . amLODipine (NORVASC) tablet 10 mg  10 mg Oral q morning Rod Can, MD   10 mg at 11/25/20 0839  . azithromycin (ZITHROMAX) tablet 500 mg  500 mg Oral QHS Donnamae Jude, RPH   500 mg at 11/24/20  2206  . cefTRIAXone (ROCEPHIN) 2 g in sodium chloride 0.9 % 100 mL IVPB  2 g Intravenous Q24H Rod Can, MD 200 mL/hr at 11/24/20 2113 2 g at 11/24/20 2113  . Chlorhexidine Gluconate Cloth 2 % PADS 6 each  6 each Topical Daily Little Ishikawa, MD   6 each at 11/25/20 [phone]  . divalproex (DEPAKOTE SPRINKLE) capsule 125 mg  125 mg Oral Q12H Rod Can, MD   125 mg at 11/25/20 0829  . docusate sodium (COLACE) capsule 100 mg  100 mg Oral BID Rod Can, MD   100 mg at 11/25/20 0829  . enoxaparin (LOVENOX) injection 40 mg  40 mg  Subcutaneous Q24H Rod Can, MD   40 mg at 11/25/20 0829  . escitalopram (LEXAPRO) tablet 15 mg  15 mg Oral q morning Rod Can, MD   15 mg at 11/25/20 0836  . feeding supplement (ENSURE ENLIVE / ENSURE PLUS) liquid 237 mL  237 mL Oral BID BM Rod Can, MD   237 mL at 11/25/20 0842  . fluticasone furoate-vilanterol (BREO ELLIPTA) 200-25 MCG/INH 1 puff  1 puff Inhalation Daily Rod Can, MD   1 puff at 11/25/20 0831  . guaiFENesin (MUCINEX) 12 hr tablet 1,200 mg  1,200 mg Oral BID Rod Can, MD   1,200 mg at 11/25/20 8099  . hydrALAZINE (APRESOLINE) injection 10 mg  10 mg Intravenous Q6H PRN Swinteck, Aaron Edelman, MD      . HYDROcodone-acetaminophen ([place]) 7.5-325 MG per tablet 1 tablet  1 tablet Oral Q6H PRN Cherlynn June B, PA   1 tablet at 11/25/20 0830  . levothyroxine (SYNTHROID) tablet 112 mcg  112 mcg Oral q morning Rod Can, MD   112 mcg at 11/25/20 0602  . loratadine (CLARITIN) tablet 10 mg  10 mg Oral q morning Rod Can, MD   10 mg at 11/25/20 0836  . LORazepam (ATIVAN) tablet 0.5 mg  0.5 mg Oral BID PRN Rod Can, MD   0.5 mg at 11/22/20 2213  . melatonin tablet 3 mg  3 mg Oral QHS PRN Rod Can, MD   3 mg at 11/24/20 2106  . menthol-cetylpyridinium (CEPACOL) lozenge 3 mg  1 lozenge Oral PRN Swinteck, Aaron Edelman, MD       Or  . phenol (CHLORASEPTIC) mouth spray 1 spray  1 spray Mouth/Throat PRN Swinteck, Aaron Edelman, MD      . metoCLOPramide (REGLAN) tablet 5-10 mg  5-10 mg Oral Q8H PRN Swinteck, Aaron Edelman, MD       Or  . metoCLOPramide (REGLAN) injection 5-10 mg  5-10 mg Intravenous Q8H PRN Swinteck, Aaron Edelman, MD      . morphine (MSIR) tablet 15 mg  15 mg Oral Q4H PRN Cherlynn June B, PA      . ondansetron (ZOFRAN) tablet 4 mg  4 mg Oral Q6H PRN Swinteck, Aaron Edelman, MD       Or  . ondansetron (ZOFRAN) injection 4 mg  4 mg Intravenous Q6H PRN Swinteck, Aaron Edelman, MD      . pantoprazole (PROTONIX) EC tablet 40 mg  40 mg Oral q morning Rod Can, MD    40 mg at 11/25/20 0839  . polyethylene glycol (MIRALAX / GLYCOLAX) packet 17 g  17 g Oral Daily PRN Swinteck, Aaron Edelman, MD      . triamcinolone (NASACORT) nasal inhaler 4 spray  4 spray Nasal QHS Rod Can, MD   4 spray at 11/24/20 2207     Discharge Medications: Please see discharge summary for a list of  discharge medications.  Relevant Imaging Results:  Relevant Lab Results:   Additional Information    Amador Cunas, [place]

## 2020-11-25 NOTE — Social Work (Signed)
Per MD, pt will be ready for dc tomorrow to SNF. Spoke to [place] in [place] admissions who confirmed they are prepared to admit pt to SNF/Memory Support tomorrow. MD aware pt will need updated COVID test. SW will assist as indicated.   Wandra Feinstein, MSW, LCSW [phone] (coverage)

## 2020-11-25 NOTE — Care Management Important Message (Signed)
Important Message  Patient Details  Name: Dawn Woods MRN: [id number] Date of Birth: [birth year]   Medicare Important Message Given:  Yes     [name] Montine Circle 11/25/2020, 3:32 PM

## 2020-11-25 NOTE — Addendum Note (Signed)
Addendum  created 11/25/20 0946 by Annye Asa, MD   SmartForm saved

## 2020-11-25 NOTE — Plan of Care (Signed)

## 2020-11-25 NOTE — Progress Notes (Signed)
Oxygen sats mid 80's on room air- continue o2 at current setting

## 2020-11-25 NOTE — Progress Notes (Signed)
PROGRESS NOTE    Dawn Woods  VZD:[id number] DOB: 08-Mar-1930 DOA: 11/21/2020 PCP: Marletta Lor, MD (Inactive)   Brief Narrative:  87 year old female with past medical history of dementia, COPD, chronic respiratory failure on 3 L of oxygen via nasal cannula, hypothyroidism, hypertension who presents to [facility] emergency department via EMS from [facility] skilled nursing facility status post fall with right hip pain. Patient is an extremely poor historian due to advanced dementia. Upon evaluation in the emergency department right hip fracture revealed subcapitellar impacted fracture of the right femoral neck with superior displacement.  CT head revealed no active disease.  Chest x-ray however did reveal a left lower lobe infiltrate.  Case was discussed with Dr. Stann Mainland with orthopedic surgery who stated that orthopedics will evaluate the patient in the morning in consultation with tentative planned surgical intervention on Monday by Dr. Lyla Glassing.  The hospitalist group was then called to assist patient with admission to the hospital.  Assessment & Plan:   Principal Problem:   Closed right hip fracture [facility]) Active Problems:   Hypothyroidism   GERD without esophagitis   Dementia with behavioral disturbance ([place])   Essential hypertension   Pneumonia of left lower lobe due to infectious organism   Fall at home, initial encounter   COPD (chronic obstructive pulmonary disease) ([place])   Chronic respiratory failure with hypoxia ([place])   Foot fracture, right  Closed right hip fracture ([place]) with profound ambulatory dysfunction - Patient has had multiple falls over the past few months, upwards of 5-6 now with new fracture as noted on imaging  - Orthopedic surgery following, appreciate insight recommendations - ORIF 11/23/20 - tolerated well -weightbearing as tolerated with walker, Lovenox SCDs and TED hose for DVT prophylaxis -Foley catheter placed by urology in OR due to  difficulty with placement.  - Continue limited and low-dose narcotics as necessary for pain control; avoid oversedation given advanced dementia and age, extremely high risk for sundowning and delirium.  Questionable pneumonia with acute on chronic hypoxic respiratory failure  - Mild leukocytosis resolved - Continue to titrate oxygen to maintain sats 88 to 92%, baseline oxygen is 3 L -continues at home oxygen level without hypoxia - Continue ceftriaxone, azithromycin x5 days  Hypothyroidism, stable - Continue home regimen of Synthroid  GERD without esophagitis - Continue home regimen of PPI  Essential hypertension - Continue home regimen of antihypertensive therapy including amlodipine  COPD (chronic obstructive pulmonary disease) (HCC) with chronic respiratory failure - No evidence of COPD exacerbation this time - Continue home regimen of maintenance inhalers -On 3 L nasal cannula around-the-clock at home  Dementia with behavioral disturbance - Longstanding history of advanced dementia - Depakote - for behavoiral issues with other patients - Continuing to achieve pain control with as needed analgesics with smallest doses possible to minimize worsening confusion - Fall precautions - Frequent redirection of patient - Patient's daughter is available nearby for visiting if necessary to help reorient patient  Goals of care : Patient assessed at her assisted living facility for skilled care as of July 2021, at that time the facility did not think the patient would meet criteria for skilled care but given her multiple falls recently and new fracture family will have facility reevaluate upon discharge back to their care.    DVT prophylaxis: Lovenox Code Status: DNR Family Communication: -daughter at bedside, discussed goals, disposition plan and prognosis.  Status is: Inpatient  Dispo: The patient is from: Facility  Anticipated d/c is to: Same              Anticipated  d/c date is: return to SNF 3/3 if bed available, ordered covid test 11/25/20              Patient currently is medically stable for discharge  Consultants:   Orthopedic surgery  Procedures:   ORIF 11/23/2020  Antimicrobials:  Azithromycin, ceftriaxone x5 days  Subjective: No specific complaints.    Objective: Vitals:   11/24/20 1344 11/24/20 1746 11/24/20 1959 11/25/20 0726  BP: (!) 108/58 (!) 133/92 126/62 (!) 144/67  Pulse: 81 86 81 71  Resp: 17 18  18   Temp: 98.3 F (36.8 C) 97.7 F (36.5 C) 97.6 F (36.4 C) 97.6 F (36.4 C)  TempSrc: Oral Oral Oral Oral  SpO2: 94% 92%  94%  Weight:      Height:        Intake/Output Summary (Last 24 hours) at 11/25/2020 1420 Last data filed at 11/25/2020 0900 Gross per 24 hour  Intake --  Output 750 ml  Net -750 ml   Filed Weights   11/23/20 1133  Weight: 67.6 kg   Examination:  General:  Awake, alert, NAD, cooperative, with dementia.  HEENT:  No gross abnormalities. Neck:  Supple, no JVD, no mass. Lungs:  BBS shallow, but clear. Heart:  Normal s1, s2 sounds, no M/r/G. Abdomen:  Soft, nontender, nondistended.  Normal bowel sounds.  Extremities: Without cyanosis, clubbing, edema, or obvious deformity. Vascular:  Dorsalis pedis and posterior tibial pulses palpable bilaterally. Skin: warm, no gross lesions.   Data Reviewed: I have personally reviewed following labs and imaging studies  CBC: Recent Labs  Lab 11/21/20 1805 11/23/20 0542 11/24/20 0207 11/25/20 0501  WBC 10.8* 9.2 11.5* 8.5  NEUTROABS 9.4*  --   --   --   HGB 13.6 13.8 11.9* 12.0  HCT 43.4 42.7 37.7 37.1  MCV 92.9 91.6 93.3 93.9  PLT 216 199 222 503   Basic Metabolic Panel: Recent Labs  Lab 11/21/20 1805 11/23/20 0542 11/24/20 0207  NA 140 140 139  K 4.0 3.6 4.2  CL 103 103 103  CO2 26 26 29   GLUCOSE 134* 99 113*  BUN 22 13 21   CREATININE 0.66 0.60 0.74  CALCIUM 8.9 8.8* 7.9*   GFR: Estimated Creatinine Clearance: 45.5 mL/min (by C-G  formula based on SCr of 0.74 mg/dL). Liver Function Tests: No results for input(s): AST, ALT, ALKPHOS, BILITOT, PROT, ALBUMIN in the last 168 hours. No results for input(s): LIPASE, AMYLASE in the last 168 hours. No results for input(s): AMMONIA in the last 168 hours. Coagulation Profile: Recent Labs  Lab 11/21/20 1805  INR 1.0   Cardiac Enzymes: No results for input(s): CKTOTAL, CKMB, CKMBINDEX, TROPONINI in the last 168 hours. BNP (last 3 results) No results for input(s): PROBNP in the last 8760 hours. HbA1C: No results for input(s): HGBA1C in the last 72 hours. CBG: No results for input(s): GLUCAP in the last 168 hours. Lipid Profile: No results for input(s): CHOL, HDL, LDLCALC, TRIG, CHOLHDL, LDLDIRECT in the last 72 hours. Thyroid Function Tests: No results for input(s): TSH, T4TOTAL, FREET4, T3FREE, THYROIDAB in the last 72 hours. Anemia Panel: No results for input(s): VITAMINB12, FOLATE, FERRITIN, TIBC, IRON, RETICCTPCT in the last 72 hours. Sepsis Labs: Recent Labs  Lab 11/22/20 0615  PROCALCITON <0.10  LATICACIDVEN 1.3    Recent Results (from the past 240 hour(s))  Resp Panel by RT-PCR (Flu A&B, Covid)  Nasopharyngeal Swab     Status: None   Collection Time: 11/21/20  6:12 PM   Specimen: Nasopharyngeal Swab; Nasopharyngeal(NP) swabs in vial transport medium  Result Value Ref Range Status   SARS Coronavirus 2 by RT PCR NEGATIVE NEGATIVE Final    Comment: (NOTE) SARS-CoV-2 target nucleic acids are NOT DETECTED.  The SARS-CoV-2 RNA is generally detectable in upper respiratory specimens during the acute phase of infection. The lowest concentration of SARS-CoV-2 viral copies this assay can detect is 138 copies/mL. A negative result does not preclude SARS-Cov-2 infection and should not be used as the sole basis for treatment or other patient management decisions. A negative result may occur with  improper specimen collection/handling, submission of specimen  other than nasopharyngeal swab, presence of viral mutation(s) within the areas targeted by this assay, and inadequate number of viral copies(<138 copies/mL). A negative result must be combined with clinical observations, patient history, and epidemiological information. The expected result is Negative.  Fact Sheet for Patients:  EntrepreneurPulse.com.au  Fact Sheet for Healthcare Providers:  IncredibleEmployment.be  This test is no t yet approved or cleared by the [place] FDA and  has been authorized for detection and/or diagnosis of SARS-CoV-2 by FDA under an Emergency Use Authorization (EUA). This EUA will remain  in effect (meaning this test can be used) for the duration of the COVID-19 declaration under Section 564(b)(1) of the Act, 21 U.S.C.section 360bbb-3(b)(1), unless the authorization is terminated  or revoked sooner.       Influenza A by PCR NEGATIVE NEGATIVE Final   Influenza B by PCR NEGATIVE NEGATIVE Final    Comment: (NOTE) The Xpert Xpress SARS-CoV-2/FLU/RSV plus assay is intended as an aid in the diagnosis of influenza from Nasopharyngeal swab specimens and should not be used as a sole basis for treatment. Nasal washings and aspirates are unacceptable for Xpert Xpress SARS-CoV-2/FLU/RSV testing.  Fact Sheet for Patients: EntrepreneurPulse.com.au  Fact Sheet for Healthcare Providers: IncredibleEmployment.be  This test is not yet approved or cleared by the [place] FDA and has been authorized for detection and/or diagnosis of SARS-CoV-2 by FDA under an Emergency Use Authorization (EUA). This EUA will remain in effect (meaning this test can be used) for the duration of the COVID-19 declaration under Section 564(b)(1) of the Act, 21 U.S.C. section 360bbb-3(b)(1), unless the authorization is terminated or revoked.  Performed at [facility], [place] [address] [place],  [place] [address]   Culture, blood (routine x 2)     Status: Abnormal   Collection Time: 11/21/20  8:55 PM   Specimen: BLOOD  Result Value Ref Range Status   Specimen Description BLOOD RIGHT HAND  Final   Special Requests   Final    BOTTLES DRAWN AEROBIC AND ANAEROBIC Blood Culture adequate volume   Culture  Setup Time   Final    GRAM POSITIVE COCCI IN CLUSTERS GRAM POSITIVE COCCI IN CHAINS AEROBIC BOTTLE ONLY CRITICAL RESULT CALLED TO, READ BACK BY AND VERIFIED WITH: [place] [id number] AT 45 BY CM    Culture (A)  Final    STREPTOCOCCUS MITIS/ORALIS STAPHYLOCOCCUS HOMINIS THE SIGNIFICANCE OF ISOLATING THIS ORGANISM FROM A SINGLE SET OF BLOOD CULTURES WHEN MULTIPLE SETS ARE DRAWN IS UNCERTAIN. PLEASE NOTIFY THE MICROBIOLOGY DEPARTMENT WITHIN ONE WEEK IF SPECIATION AND SENSITIVITIES ARE REQUIRED. Performed at [facility], [place] [address], [place], [place] [address]    Report Status 11/24/2020 FINAL  Final  Blood Culture ID Panel (Reflexed)  Status: Abnormal   Collection Time: 11/21/20  8:55 PM  Result Value Ref Range Status   Enterococcus faecalis NOT DETECTED NOT DETECTED Final   Enterococcus Faecium NOT DETECTED NOT DETECTED Final   Listeria monocytogenes NOT DETECTED NOT DETECTED Final   Staphylococcus species DETECTED (A) NOT DETECTED Final    Comment: CRITICAL RESULT CALLED TO, READ BACK BY AND VERIFIED WITH: PHARMD M MACCIA [id number] AT 1613 BY CM    Staphylococcus aureus (BCID) NOT DETECTED NOT DETECTED Final   Staphylococcus epidermidis NOT DETECTED NOT DETECTED Final   Staphylococcus lugdunensis NOT DETECTED NOT DETECTED Final   Streptococcus species DETECTED (A) NOT DETECTED Final    Comment: Not Enterococcus species, Streptococcus agalactiae, Streptococcus pyogenes, or Streptococcus pneumoniae. CRITICAL RESULT CALLED TO, READ BACK BY AND VERIFIED WITH: PHARMD M [place] [id number] AT 38 BY CM    Streptococcus agalactiae NOT DETECTED NOT DETECTED Final   Streptococcus  pneumoniae NOT DETECTED NOT DETECTED Final   Streptococcus pyogenes NOT DETECTED NOT DETECTED Final   A.calcoaceticus-baumannii NOT DETECTED NOT DETECTED Final   Bacteroides fragilis NOT DETECTED NOT DETECTED Final   Enterobacterales NOT DETECTED NOT DETECTED Final   Enterobacter cloacae complex NOT DETECTED NOT DETECTED Final   Escherichia coli NOT DETECTED NOT DETECTED Final   Klebsiella aerogenes NOT DETECTED NOT DETECTED Final   Klebsiella oxytoca NOT DETECTED NOT DETECTED Final   Klebsiella pneumoniae NOT DETECTED NOT DETECTED Final   Proteus species NOT DETECTED NOT DETECTED Final   Salmonella species NOT DETECTED NOT DETECTED Final   Serratia marcescens NOT DETECTED NOT DETECTED Final   Haemophilus influenzae NOT DETECTED NOT DETECTED Final   Neisseria meningitidis NOT DETECTED NOT DETECTED Final   Pseudomonas aeruginosa NOT DETECTED NOT DETECTED Final   Stenotrophomonas maltophilia NOT DETECTED NOT DETECTED Final   Candida albicans NOT DETECTED NOT DETECTED Final   Candida auris NOT DETECTED NOT DETECTED Final   Candida glabrata NOT DETECTED NOT DETECTED Final   Candida krusei NOT DETECTED NOT DETECTED Final   Candida parapsilosis NOT DETECTED NOT DETECTED Final   Candida tropicalis NOT DETECTED NOT DETECTED Final   Cryptococcus neoformans/gattii NOT DETECTED NOT DETECTED Final    Comment: Performed at [facility], [address] [place], [place] [address]  Culture, blood (routine x 2)     Status: None (Preliminary result)   Collection Time: 11/22/20  6:22 AM   Specimen: BLOOD  Result Value Ref Range Status   Specimen Description BLOOD LEFT ANTECUBITAL  Final   Special Requests AEROBIC BOTTLE ONLY Blood Culture adequate volume  Final   Culture   Final    NO GROWTH 3 DAYS Performed at [facility], [place] [address], [place], [place] [address]    Report Status PENDING  Incomplete  MRSA PCR Screening     Status: None   Collection Time: 11/22/20 10:55 AM    Specimen: Nasal Mucosa; Nasopharyngeal  Result Value Ref Range Status   MRSA by PCR NEGATIVE NEGATIVE Final    Comment:        The GeneXpert MRSA Assay (FDA approved for NASAL specimens only), is one component of a comprehensive MRSA colonization surveillance program. It is not intended to diagnose MRSA infection nor to guide or monitor treatment for MRSA infections. Performed at [facility], [place] [address] [place], [place] [address]     Radiology Studies: Pelvis Portable  Result Date: 11/23/2020 CLINICAL DATA:  Closed displaced fracture RIGHT femoral neck EXAM: PORTABLE PELVIS 1-2 VIEWS  at COMPARISON:  Portable exam 1454 hours compared to 11/23/2020 and 11/21/2020 FINDINGS: Interval resection of RIGHT femoral head/neck and placement of a femoral hip prosthesis. LEFT hip joint space preserved. No fracture, dislocation, or bone destruction. IMPRESSION: RIGHT hip prosthesis without acute complication. Electronically Signed   By: Lavonia Dana M.D.   On: 11/23/2020 15:59   Scheduled Meds: . amLODipine  10 mg Oral q morning  . azithromycin  500 mg Oral QHS  . Chlorhexidine Gluconate Cloth  6 each Topical Daily  . divalproex  125 mg Oral Q12H  . docusate sodium  100 mg Oral BID  . enoxaparin (LOVENOX) injection  40 mg Subcutaneous Q24H  . escitalopram  15 mg Oral q morning  . feeding supplement  237 mL Oral BID BM  . fluticasone furoate-vilanterol  1 puff Inhalation Daily  . guaiFENesin  1,200 mg Oral BID  . levothyroxine  112 mcg Oral q morning  . loratadine  10 mg Oral q morning  . pantoprazole  40 mg Oral q morning  . triamcinolone  4 spray Nasal QHS   Continuous Infusions: . cefTRIAXone (ROCEPHIN)  IV 2 g (11/24/20 2113)    LOS: 3 days   Time spent: 36 min  Cathlene Gardella,MD How to contact the [facility] Attending or Consulting provider [place] or covering provider during after hours [place], for this patient?  1. Check the care team in [facility] and look for a)  attending/consulting TR[facility] provider listed and b) the [facility] team listed 2. Log into [email or web address] and use [place]'s universal password to access. If you do not have the password, please contact the hospital operator. 3. Locate the [facility] provider you are looking for under [facility] and page to a number that you can be directly reached. 4. If you still have difficulty reaching the provider, please page the [facility] (Director on Call) for the Hospitalists listed on amion for assistance.  If 7PM-7AM, please contact night-coverage [email or web address]  11/25/2020, 2:20 PM

## 2020-11-25 NOTE — Progress Notes (Signed)
Physical Therapy Treatment Patient Details Name: Dawn Woods MRN: [id number] DOB: [birth year] Today's Date: 11/25/2020    History of Present Illness 87 year old female with past medical history of dementia, COPD, chronic respiratory failure on 3 L of oxygen via nasal cannula, hypothyroidism, hypertension who presents to [facility] emergency department via EMS from [facility] skilled nursing facility status post fall with right hip pain. pt underwent R anterior approach hemiarthroplasy as surgical intervention on 11/23/20.    PT Comments    Session limited by agitation, however with max encouragement patient was able to perform sit to stand x 2 with RW and maxA. Patient insisted on laying down throughout static sitting. Continue to recommend SNF for ongoing Physical Therapy.       Follow Up Recommendations  SNF;Supervision/Assistance - 24 hour     Equipment Recommendations  Rolling walker with 5" wheels;3in1 (PT);Wheelchair (measurements PT);Wheelchair cushion (measurements PT)    Recommendations for Other Services       Precautions / Restrictions Precautions Precautions: Fall Precaution Comments: fall, dementia at baseline, anterior approach R hip Restrictions Weight Bearing Restrictions: Yes RLE Weight Bearing: Weight bearing as tolerated    Mobility  Bed Mobility Overal bed mobility: Needs Assistance Bed Mobility: Supine to Sit;Sit to Supine     Supine to sit: Max assist Sit to supine: Max assist   General bed mobility comments: maxA for bringing LEs to EOB and patient able to initiate trunk elevation however required assistance.    Transfers Overall transfer level: Needs assistance Equipment used: Rolling walker (2 wheeled) Transfers: Sit to/from Stand Sit to Stand: Max assist         General transfer comment: patient declined initially and began returning to supine after sitting EOB for <2 minutes. Max encouragement provided by therapist and daughter.  Patient finally agreeable to perform. Sit to stand x 2 with maxA for power up, cues for hip extension with poor follow through.  Ambulation/Gait             General Gait Details: refused   Stairs             Wheelchair Mobility    Modified Rankin (Stroke Patients Only)       Balance Overall balance assessment: Needs assistance Sitting-balance support: Feet supported Sitting balance-Leahy Scale: Fair Sitting balance - Comments: min guard when patient wasn't insisting to lay down   Standing balance support: Bilateral upper extremity supported Standing balance-Leahy Scale: Poor Standing balance comment: reliant on UE support and external assist                            Cognition Arousal/Alertness: Awake/alert Behavior During Therapy: Agitated Overall Cognitive Status: History of cognitive impairments - at baseline                                 General Comments: Oriented to person, place and situation. Patient agitated this session with any movement      Exercises      General Comments        Pertinent Vitals/Pain Pain Assessment: Faces Faces Pain Scale: Hurts little more Pain Location: with general movement Pain Descriptors / Indicators: Grimacing;Guarding;Moaning Pain Intervention(s): Monitored during session    Home Living                      Prior Function  PT Goals (current goals can now be found in the care plan section) Acute Rehab PT Goals Patient Stated Goal: daughter states "go to rehab wing of [facility]" PT Goal Formulation: With family Time For Goal Achievement: 12/08/20 Potential to Achieve Goals: Good Progress towards PT goals: Progressing toward goals    Frequency    Min 3X/week      PT Plan Current plan remains appropriate    Co-evaluation              AM-PAC PT "6 Clicks" Mobility   Outcome Measure  Help needed turning from your back to your side while in a  flat bed without using bedrails?: A Lot Help needed moving from lying on your back to sitting on the side of a flat bed without using bedrails?: A Lot Help needed moving to and from a bed to a chair (including a wheelchair)?: A Lot Help needed standing up from a chair using your arms (e.g., wheelchair or bedside chair)?: A Lot Help needed to walk in hospital room?: Total Help needed climbing 3-5 steps with a railing? : Total 6 Click Score: 10    End of Session Equipment Utilized During Treatment: Oxygen;Gait belt Activity Tolerance: Treatment limited secondary to agitation Patient left: in bed;with call bell/phone within reach;with bed alarm set Nurse Communication: Mobility status PT Visit Diagnosis: Unsteadiness on feet (R26.81);Difficulty in walking, not elsewhere classified (R26.2);History of falling (Z91.81);Muscle weakness (generalized) (M62.81)     Time: 8677-3736 PT Time Calculation (min) (ACUTE ONLY): 30 min  Charges:  $Therapeutic Activity: 23-37 mins                     Dawn Woods PT, DPT Acute Rehabilitation Services Pager [phone] Office [phone]    Dawn Woods 11/25/2020, 4:46 PM

## 2020-11-26 MED ORDER — ENSURE ENLIVE PO LIQD: 237.0000 mL | Freq: Two times a day (BID) | ORAL | 12 refills | Status: AC

## 2020-11-26 MED ORDER — TRIAMCINOLONE ACETONIDE 55 MCG/ACT NA AERO: 2.0000 | INHALATION_SPRAY | Freq: Every day | NASAL | 12 refills | Status: DC

## 2020-11-26 MED ORDER — LORAZEPAM 0.5 MG PO TABS: 0.5000 mg | ORAL_TABLET | Freq: Two times a day (BID) | ORAL | 0 refills | Status: DC | PRN

## 2020-11-26 MED ORDER — PANTOPRAZOLE SODIUM 40 MG PO TBEC: 40.0000 mg | DELAYED_RELEASE_TABLET | Freq: Every morning | ORAL | Status: AC

## 2020-11-26 MED ORDER — POLYETHYLENE GLYCOL 3350 17 G PO PACK
17.0000 g | PACK | Freq: Every day | ORAL | 0 refills | Status: AC | PRN
Start: 2020-11-26 — End: ?

## 2020-11-26 NOTE — Progress Notes (Signed)
Discharge package printed and sent with PTAR. Report given to nurse Arbie Cookey.

## 2020-11-26 NOTE — Progress Notes (Signed)
Nutrition Follow-up  DOCUMENTATION CODES:   Not applicable  INTERVENTION:   -Continue MVI with minerals daily -Continue Ensure Enlive po BID, each supplement provides 350 kcal and 20 grams of protein  NUTRITION DIAGNOSIS:   Increased nutrient needs related to post-op healing as evidenced by estimated needs.  Ongoing  GOAL:   Patient will meet greater than or equal to 90% of their needs  Progressing   MONITOR:   PO intake,Supplement acceptance,Diet advancement,Labs,Weight trends,Skin,I & O's  REASON FOR ASSESSMENT:   Consult Assessment of nutrition requirement/status,Hip fracture protocol  ASSESSMENT:   87 year old female with past medical history of dementia, COPD, chronic respiratory failure on 3 L of oxygen via nasal cannula, hypothyroidism, hypertension who presents to [facility] emergency department via EMS from [facility] skilled nursing facility status post fall with right hip pain.  2/28- s/p Right hip hemiarthroplasty, anterior approach.  Reviewed I/O's: -1 L x 24 hours and -2.5 L since admission  UOP: 1 L x 24 hours  Spoke with pt at bedside, who did not answer most of RD questions. Pt refused nutrition-focused physical exam. Pt only consumed a few bites of oatmeal and 100% of orange juice off of meal tray. Noted meal completions 25-30%.   Per MD notes, plan to possibly discharge to SNF today.   Labs reviewed.   NUTRITION - FOCUSED PHYSICAL EXAM:  Flowsheet Row Most Recent Value  Orbital Region Moderate depletion  Upper Arm Region Unable to assess  Thoracic and Lumbar Region Unable to assess  Buccal Region Mild depletion  Temple Region Mild depletion  Clavicle Bone Region Unable to assess  Clavicle and Acromion Bone Region Unable to assess  Scapular Bone Region Unable to assess  Dorsal Hand Unable to assess  Patellar Region Unable to assess  Anterior Thigh Region Unable to assess  Posterior Calf Region Unable to assess  Edema (RD  Assessment) Unable to assess  Hair Reviewed  Eyes Reviewed  Mouth Reviewed  Skin Reviewed  Nails Unable to assess       Diet Order:   Diet Order            Diet regular Room service appropriate? Yes; Fluid consistency: Thin  Diet effective now                 EDUCATION NEEDS:   No education needs have been identified at this time  Skin:  Skin Assessment: Reviewed RN Assessment  Last BM:  Unknown  Height:   Ht Readings from Last 1 Encounters:  11/23/20 5\' 6"  (1.676 m)    Weight:   Wt Readings from Last 1 Encounters:  11/23/20 67.6 kg    Ideal Body Weight:  59.1 kg  BMI:  Body mass index is 24.05 kg/m.  Estimated Nutritional Needs:   Kcal:  1700-1900  Protein:  85-100 grams  Fluid:  > 1.7 L    Loistine Chance, RD, LDN, [place] Registered Dietitian II Certified Diabetes Care and Education Specialist Please refer to [facility] for RD and/or RD on-call/weekend/after hours pager

## 2020-11-26 NOTE — Discharge Summary (Signed)
Physician Discharge Summary  Dawn Woods TIW:[id number] DOB: August 23, 1930 DOA: 11/21/2020  PCP: Marletta Lor, MD (Inactive)  Admit date: 11/21/2020 Discharge date: 11/26/2020  Disposition:  SNF   Recommendations for Outpatient Follow-up:  1. Continue lovenox DVT prophylaxis for 30 days.  2. Please check CBC weekly while on lovenox therapy. 3. Please remove Foley Catheter on 11/30/20 and do a voiding trial, if not able to urinate may have to replace foley and schedule a urologist appointment.    Discharge Condition: STABLE   CODE STATUS: DNR  DIET:  Regular   Brief Hospitalization Summary: Please see all hospital notes, images, labs for full details of the hospitalization. Brief Narrative:  87 year old female with past medical history of dementia, COPD, chronic respiratory failure on 3 L of oxygen via nasal cannula, hypothyroidism, hypertension who [facility] with right hip pain. Patient is an extremely poor historian due to advanced dementia. Upon evaluation in the emergency department right hip fracture revealed subcapitellar impacted fracture of the right femoral neck with superior displacement. CT head revealed no active disease. Chest x-ray however did reveal a left lower lobe infiltrate. Case was discussed with Dr. Stann Mainland with orthopedic surgery who stated that orthopedics will evaluate the patient in the morning in consultation with tentative planned surgical intervention on Monday by Dr. Lyla Glassing. The hospitalist group was then called to assist patient with admission to the hospital.  Assessment & Plan:   Principal Problem:   Closed right hip fracture Active Problems:   Hypothyroidism   GERD without esophagitis   Dementia with behavioral disturbance    Essential hypertension   Pneumonia of left lower lobe due to infectious organism   Fall at home, initial  encounter   COPD (chronic obstructive pulmonary disease)   Chronic respiratory failure with hypoxia   Foot fracture, right  Closed right hip fracture with profound ambulatory dysfunction - Patient has had multiple falls over the past few months, upwards of 5-6 now with new fracture as noted on imaging  - Orthopedic surgery following, appreciate insight recommendations - ORIF 11/23/20 - tolerated well -weightbearing as tolerated with walker, Lovenox SCDs and TED hose for DVT prophylaxis -Foley catheter placed by urology in OR due to difficulty with placement.  - Instructions for SNF: Please remove foley on 11/30/20 and do voiding trial.  If not able to urinate may have to replace foley cath.    Questionable pneumonia with acute on chronic hypoxic respiratory failure - TREATED  - Mild leukocytosis resolved - Continue oxygen to maintain sats 88 to 92%, baseline oxygen is 3 L -continues at home oxygen level without hypoxia - Pt was treated with ceftriaxone and azithromycin  Hypothyroidism, stable - Continue home regimen of Synthroid  GERD without esophagitis - Continue home regimen of PPI  Essential hypertension - Continue home regimen of antihypertensive therapy including amlodipine  COPD (chronic obstructive pulmonary disease) with chronic respiratory failure - No evidence of COPD exacerbation this time - Continue home regimen of maintenance inhalers -On 3 L nasal cannula around-the-clock at home  Dementia with behavioral disturbance - Longstanding history of advanced dementia - Depakote - for behavoiral issues with other patients - Continuing to achieve pain control with as needed analgesics with smallestdoses possible to minimize worsening confusion - Fall precautions - Frequent redirection of patient - Patient's daughter is available nearby for visiting if necessary to help reorient patient  Goals of care : Patient assessed at  her assisted living facility for skilled  care as of July 2021, at that time the facility did not think the patient would meet criteria for skilled care but given her multiple falls recently and new fracture family will have facility reevaluate upon discharge back to their care.    DVT prophylaxis: Lovenox Code Status: DNR Family Communication: -daughter at bedside, discussed goals, disposition plan and prognosis.  Status is: Inpatient  Dispo: The patient is from: Facility  Anticipated d/c is to: Same  Anticipated d/c date is: return to SNF 3/3, Covid test negative.   Patient currently is medically stable for discharge  Consultants:   Orthopedic surgery  Procedures:   ORIF 11/23/2020  Antimicrobials:  Azithromycin, ceftriaxone x5 days  Discharge Diagnoses:  Principal Problem:   Closed right hip fracture ([facility]) Active Problems:   Hypothyroidism   GERD without esophagitis   Dementia with behavioral disturbance ([place])   Essential hypertension   Pneumonia of left lower lobe due to infectious organism   Fall at home, initial encounter   COPD (chronic obstructive pulmonary disease) ([place])   Chronic respiratory failure with hypoxia ([place])   Foot fracture, right   Discharge Instructions:  Allergies as of 11/26/2020      Reactions   Aspirin Other (See Comments)   GI Bleed   Ibuprofen Other (See Comments)   GI Bleed   Ciprofloxacin Hives      Medication List    STOP taking these medications   CRANBERRY EXTRACT PO   OXYGEN     TAKE these medications   albuterol 108 (90 Base) MCG/ACT inhaler Commonly known as: VENTOLIN HFA Inhale 2 puffs into the lungs every 6 (six) hours as needed for wheezing or shortness of breath.   amLODipine 10 MG tablet Commonly known as: NORVASC Take 10 mg by mouth every morning.   budesonide-formoterol 160-4.5 MCG/ACT inhaler Commonly known as: SYMBICORT Inhale 2 puffs into the lungs 2 (two) times daily.   divalproex 125 MG  capsule Commonly known as: DEPAKOTE SPRINKLE Take 125 mg by mouth See admin instructions. Take one capsule (125 mg) by mouth twice daily - morning and midday   enoxaparin 40 MG/0.4ML injection Commonly known as: LOVENOX Inject 0.4 mLs (40 mg total) into the skin daily.   escitalopram 10 MG tablet Commonly known as: LEXAPRO Take 15 mg by mouth every morning.   feeding supplement Liqd Take 237 mLs by mouth 2 (two) times daily between meals.   HYDROcodone-acetaminophen 5-325 MG tablet Commonly known as: NORCO/VICODIN Take 2 tablets by mouth every 6 (six) hours as needed for moderate pain.   levothyroxine 112 MCG tablet Commonly known as: SYNTHROID Take 112 mcg by mouth every morning.   loratadine 10 MG tablet Commonly known as: CLARITIN Take 10 mg by mouth every morning.   LORazepam 0.5 MG tablet Commonly known as: ATIVAN Take 1 tablet (0.5 mg total) by mouth 2 (two) times daily as needed for anxiety (agitation).   melatonin 3 MG Tabs tablet Take 3 mg by mouth at bedtime as needed (insomnia).   Mucinex Maximum Strength 1200 MG Tb12 Generic drug: Guaifenesin Take 1,200 mg by mouth 2 (two) times daily.   pantoprazole 40 MG tablet Commonly known as: PROTONIX Take 1 tablet (40 mg total) by mouth every morning.   polyethylene glycol 17 g packet Commonly known as: MIRALAX / GLYCOLAX Take 17 g by mouth daily as needed for mild constipation.   PREVIDENT 5000 PLUS DT Place 1 application onto teeth See admin instructions.  Brush topically on teeth with a toothbrush after evening mouth care   triamcinolone 55 MCG/ACT Aero nasal inhaler Commonly known as: NASACORT Place 2 sprays into the nose at bedtime. What changed:   how much to take  additional instructions   Vitamin D (Ergocalciferol) 1.25 MG (50000 UNIT) Caps capsule Commonly known as: DRISDOL Take 50,000 Units by mouth every Thursday.            Durable Medical Equipment  (From admission, onward)          Start     Ordered   11/26/20 0739  For home use only DME wheelchair cushion (seat and back)  Once        11/26/20 0738   11/26/20 0738  For home use only DME Walker rolling  Once       Question Answer Comment  Walker: With [place] Wheels   Patient needs a walker to treat with the following condition Gait instability      11/26/20 0738   11/26/20 0738  For home use only DME 3 n 1  Once        11/26/20 1497          Contact information for follow-up providers    Swinteck, Aaron Edelman, MD. Schedule an appointment as soon as possible for a visit in 2 weeks.   Specialty: Orthopedic Surgery Why: For wound re-check, For suture removal Contact information: [address] [place] [address] [phone]            Contact information for after-discharge care    Destination    [facility] .   Service: Skilled Nursing Contact information: [address] [place] [address] [phone]                 Allergies  Allergen Reactions  . Aspirin Other (See Comments)    GI Bleed  . Ibuprofen Other (See Comments)    GI Bleed  . Ciprofloxacin Hives   Allergies as of 11/26/2020      Reactions   Aspirin Other (See Comments)   GI Bleed   Ibuprofen Other (See Comments)   GI Bleed   Ciprofloxacin Hives      Medication List    STOP taking these medications   CRANBERRY EXTRACT PO   OXYGEN     TAKE these medications   albuterol 108 (90 Base) MCG/ACT inhaler Commonly known as: VENTOLIN HFA Inhale 2 puffs into the lungs every 6 (six) hours as needed for wheezing or shortness of breath.   amLODipine 10 MG tablet Commonly known as: NORVASC Take 10 mg by mouth every morning.   budesonide-formoterol 160-4.5 MCG/ACT inhaler Commonly known as: SYMBICORT Inhale 2 puffs into the lungs 2 (two) times daily.   divalproex 125 MG capsule Commonly known as: DEPAKOTE SPRINKLE Take 125 mg by mouth See admin  instructions. Take one capsule (125 mg) by mouth twice daily - morning and midday   enoxaparin 40 MG/0.4ML injection Commonly known as: LOVENOX Inject 0.4 mLs (40 mg total) into the skin daily.   escitalopram 10 MG tablet Commonly known as: LEXAPRO Take 15 mg by mouth every morning.   feeding supplement Liqd Take 237 mLs by mouth 2 (two) times daily between meals.   HYDROcodone-acetaminophen 5-325 MG tablet Commonly known as: NORCO/VICODIN Take 2 tablets by mouth every 6 (six) hours as needed for moderate pain.   levothyroxine 112 MCG tablet Commonly known as: SYNTHROID Take 112 mcg  by mouth every morning.   loratadine 10 MG tablet Commonly known as: CLARITIN Take 10 mg by mouth every morning.   LORazepam 0.5 MG tablet Commonly known as: ATIVAN Take 1 tablet (0.5 mg total) by mouth 2 (two) times daily as needed for anxiety (agitation).   melatonin 3 MG Tabs tablet Take 3 mg by mouth at bedtime as needed (insomnia).   Mucinex Maximum Strength 1200 MG Tb12 Generic drug: Guaifenesin Take 1,200 mg by mouth 2 (two) times daily.   pantoprazole 40 MG tablet Commonly known as: PROTONIX Take 1 tablet (40 mg total) by mouth every morning.   polyethylene glycol 17 g packet Commonly known as: MIRALAX / GLYCOLAX Take 17 g by mouth daily as needed for mild constipation.   PREVIDENT 5000 PLUS DT Place 1 application onto teeth See admin instructions. Brush topically on teeth with a toothbrush after evening mouth care   triamcinolone 55 MCG/ACT Aero nasal inhaler Commonly known as: NASACORT Place 2 sprays into the nose at bedtime. What changed:   how much to take  additional instructions   Vitamin D (Ergocalciferol) 1.25 MG (50000 UNIT) Caps capsule Commonly known as: DRISDOL Take 50,000 Units by mouth every Thursday.            Durable Medical Equipment  (From admission, onward)         Start     Ordered   11/26/20 0739  For home use only DME wheelchair cushion  (seat and back)  Once        11/26/20 0738   11/26/20 0738  For home use only DME Walker rolling  Once       Question Answer Comment  Walker: With [place] Wheels   Patient needs a walker to treat with the following condition Gait instability      11/26/20 0738   11/26/20 0738  For home use only DME 3 n 1  Once        11/26/20 3559          Procedures/Studies: DG Chest 1 View  Result Date: 11/21/2020 CLINICAL DATA:  Pain post fall. EXAM: CHEST  1 VIEW COMPARISON:  September 24, 2020 FINDINGS: Enlarged cardiac silhouette. Calcific atherosclerotic disease and tortuosity of the aorta. Left costophrenic angle consolidation. Increased interstitial markings. Osseous structures are without acute abnormality. Soft tissues are grossly normal. IMPRESSION: 1. Left costophrenic angle consolidation which may represent pleural effusion, atelectasis or airspace disease. 2. Increased interstitial markings which may be seen with pulmonary edema. Electronically Signed   By: Fidela Salisbury M.D.   On: 11/21/2020 18:03   CT HEAD WO CONTRAST  Result Date: 11/21/2020 CLINICAL DATA:  Neck injury. EXAM: CT HEAD WITHOUT CONTRAST CT CERVICAL SPINE WITHOUT CONTRAST TECHNIQUE: Multidetector CT imaging of the head and cervical spine was performed following the standard protocol without intravenous contrast. Multiplanar CT image reconstructions of the cervical spine were also generated. COMPARISON:  October 15, 2016. FINDINGS: CT HEAD FINDINGS Brain: Mild chronic ischemic white matter disease is noted. Mild diffuse cortical atrophy is noted. No mass effect or midline shift is noted. Ventricular size is within normal limits. There is no evidence of mass lesion, hemorrhage or acute infarction. Vascular: No hyperdense vessel or unexpected calcification. Skull: Normal. Negative for fracture or focal lesion. Sinuses/Orbits: No acute finding. Other: None. CT CERVICAL SPINE FINDINGS Alignment: Mild grade 1 anterolisthesis of C4-5  is noted secondary to posterior facet joint hypertrophy. Skull base and vertebrae: No acute fracture. No primary bone lesion  or focal pathologic process. Soft tissues and spinal canal: No prevertebral fluid or swelling. No visible canal hematoma. Disc levels: Moderate degenerative disc disease is noted at C3-4 and C5-6. Severe degenerative disc disease is noted at C6-7. Upper chest: Negative. Other: None. IMPRESSION: 1. Mild chronic ischemic white matter disease. Mild diffuse cortical atrophy. No acute intracranial abnormality seen. 2. Multilevel degenerative disc disease. No acute abnormality seen in the cervical spine. Electronically Signed   By: Marijo Conception M.D.   On: 11/21/2020 19:18   CT CERVICAL SPINE WO CONTRAST  Result Date: 11/21/2020 CLINICAL DATA:  Neck injury. EXAM: CT HEAD WITHOUT CONTRAST CT CERVICAL SPINE WITHOUT CONTRAST TECHNIQUE: Multidetector CT imaging of the head and cervical spine was performed following the standard protocol without intravenous contrast. Multiplanar CT image reconstructions of the cervical spine were also generated. COMPARISON:  October 15, 2016. FINDINGS: CT HEAD FINDINGS Brain: Mild chronic ischemic white matter disease is noted. Mild diffuse cortical atrophy is noted. No mass effect or midline shift is noted. Ventricular size is within normal limits. There is no evidence of mass lesion, hemorrhage or acute infarction. Vascular: No hyperdense vessel or unexpected calcification. Skull: Normal. Negative for fracture or focal lesion. Sinuses/Orbits: No acute finding. Other: None. CT CERVICAL SPINE FINDINGS Alignment: Mild grade 1 anterolisthesis of C4-5 is noted secondary to posterior facet joint hypertrophy. Skull base and vertebrae: No acute fracture. No primary bone lesion or focal pathologic process. Soft tissues and spinal canal: No prevertebral fluid or swelling. No visible canal hematoma. Disc levels: Moderate degenerative disc disease is noted at C3-4 and C5-6.  Severe degenerative disc disease is noted at C6-7. Upper chest: Negative. Other: None. IMPRESSION: 1. Mild chronic ischemic white matter disease. Mild diffuse cortical atrophy. No acute intracranial abnormality seen. 2. Multilevel degenerative disc disease. No acute abnormality seen in the cervical spine. Electronically Signed   By: Marijo Conception M.D.   On: 11/21/2020 19:18   Pelvis Portable  Result Date: 11/23/2020 CLINICAL DATA:  Closed displaced fracture RIGHT femoral neck EXAM: PORTABLE PELVIS 1-2 VIEWS at COMPARISON:  Portable exam 1454 hours compared to 11/23/2020 and 11/21/2020 FINDINGS: Interval resection of RIGHT femoral head/neck and placement of a femoral hip prosthesis. LEFT hip joint space preserved. No fracture, dislocation, or bone destruction. IMPRESSION: RIGHT hip prosthesis without acute complication. Electronically Signed   By: Lavonia Dana M.D.   On: 11/23/2020 15:59   DG Knee Right Port  Result Date: 11/22/2020 CLINICAL DATA:  Femoral fracture EXAM: PORTABLE RIGHT KNEE - 1-2 VIEW COMPARISON:  November 21, 2020 FINDINGS: Osteopenia. No acute fracture or dislocation. Mild degenerative changes of the medial compartment. No area of erosion or osseous destruction. No unexpected radiopaque foreign body. Vascular calcifications. IMPRESSION: No acute fracture or dislocation of the right knee. Electronically Signed   By: Valentino Saxon MD   On: 11/22/2020 09:05   DG C-Arm 1-60 Min  Result Date: 11/23/2020 CLINICAL DATA:  Hemiarthroplasty. EXAM: OPERATIVE RIGHT HIP (WITH PELVIS IF PERFORMED) 2 VIEWS TECHNIQUE: Fluoroscopic spot image(s) were submitted for interpretation post-operatively. COMPARISON:  November 21, 2020. FINDINGS: Fluoro time: 5 seconds. Reported radiation: 0.30 mGy. Two C-arm fluoroscopic images were obtained intraoperatively and submitted for post operative interpretation. These images demonstrate postsurgical changes of right hemiarthroplasty. No unexpected findings.  Please see the performing provider's procedural report for further detail. IMPRESSION: Intraoperative fluoroscopy, as detailed above. Electronically Signed   By: Margaretha Sheffield MD   On: 11/23/2020 14:20   DG HIP OPERATIVE UNILAT  W OR W/O PELVIS RIGHT  Result Date: 11/23/2020 CLINICAL DATA:  Hemiarthroplasty. EXAM: OPERATIVE RIGHT HIP (WITH PELVIS IF PERFORMED) 2 VIEWS TECHNIQUE: Fluoroscopic spot image(s) were submitted for interpretation post-operatively. COMPARISON:  November 21, 2020. FINDINGS: Fluoro time: 5 seconds. Reported radiation: 0.30 mGy. Two C-arm fluoroscopic images were obtained intraoperatively and submitted for post operative interpretation. These images demonstrate postsurgical changes of right hemiarthroplasty. No unexpected findings. Please see the performing provider's procedural report for further detail. IMPRESSION: Intraoperative fluoroscopy, as detailed above. Electronically Signed   By: Margaretha Sheffield MD   On: 11/23/2020 14:20   DG Hip Unilat With Pelvis 2-3 Views Right  Result Date: 11/21/2020 CLINICAL DATA:  Pain post fall. EXAM: DG HIP (WITH OR WITHOUT PELVIS) 2-3V RIGHT COMPARISON:  None. FINDINGS: Sub capitellar impacted fracture of the right femoral neck with superior displacement of the distal fracture fragment. Femoral head is normally located within the acetabulum. IMPRESSION: Sub capitellar impacted fracture of the right femoral neck with superior displacement of the distal fracture fragment. Electronically Signed   By: Fidela Salisbury M.D.   On: 11/21/2020 18:04      Subjective: No specific complaints.    Discharge Exam: Vitals:   11/26/20 0301 11/26/20 0700  BP: (!) 152/58 (!) 151/93  Pulse: 72 71  Resp: 14 16  Temp: 98.4 F (36.9 C) 97.8 F (36.6 C)  SpO2: 97% 94%   Vitals:   11/25/20 1440 11/25/20 2016 11/26/20 0301 11/26/20 0700  BP: 128/62 139/73 (!) 152/58 (!) 151/93  Pulse: 72 73 72 71  Resp: 17 20 14 16   Temp: (!) 97.5 F (36.4 C)  98.5 F (36.9 C) 98.4 F (36.9 C) 97.8 F (36.6 C)  TempSrc: Oral   Oral  SpO2: 90% 94% 97% 94%  Weight:      Height:       General:  Awake, alert, NAD, cooperative, with dementia.  HEENT:  No gross abnormalities. Neck:  Supple, no JVD, no mass. Lungs:  BBS shallow, but clear. Heart:  Normal s1, s2 sounds, no M/r/G. Abdomen:  Soft, nontender, nondistended.  Normal bowel sounds.  Extremities: bandages clean and dry and intact.  No cyanosis, clubbing, edema, or obvious deformity. Vascular:  Dorsalis pedis and posterior tibial pulses palpable bilaterally. Skin: warm, no gross lesions.    The results of significant diagnostics from this hospitalization (including imaging, microbiology, ancillary and laboratory) are listed below for reference.     Microbiology: Recent Results (from the past 240 hour(s))  Resp Panel by RT-PCR (Flu A&B, Covid) Nasopharyngeal Swab     Status: None   Collection Time: 11/21/20  6:12 PM   Specimen: Nasopharyngeal Swab; Nasopharyngeal(NP) swabs in vial transport medium  Result Value Ref Range Status   SARS Coronavirus 2 by RT PCR NEGATIVE NEGATIVE Final    Comment: (NOTE) SARS-CoV-2 target nucleic acids are NOT DETECTED.  The SARS-CoV-2 RNA is generally detectable in upper respiratory specimens during the acute phase of infection. The lowest concentration of SARS-CoV-2 viral copies this assay can detect is 138 copies/mL. A negative result does not preclude SARS-Cov-2 infection and should not be used as the sole basis for treatment or other patient management decisions. A negative result may occur with  improper specimen collection/handling, submission of specimen other than nasopharyngeal swab, presence of viral mutation(s) within the areas targeted by this assay, and inadequate number of viral copies(<138 copies/mL). A negative result must be combined with clinical observations, patient history, and epidemiological information. The expected result is  Negative.  Fact Sheet for Patients:  EntrepreneurPulse.com.au  Fact Sheet for Healthcare Providers:  IncredibleEmployment.be  This test is no t yet approved or cleared by the [place] FDA and  has been authorized for detection and/or diagnosis of SARS-CoV-2 by FDA under an Emergency Use Authorization (EUA). This EUA will remain  in effect (meaning this test can be used) for the duration of the COVID-19 declaration under Section 564(b)(1) of the Act, 21 U.S.C.section 360bbb-3(b)(1), unless the authorization is terminated  or revoked sooner.       Influenza A by PCR NEGATIVE NEGATIVE Final   Influenza B by PCR NEGATIVE NEGATIVE Final    Comment: (NOTE) The Xpert Xpress SARS-CoV-2/FLU/RSV plus assay is intended as an aid in the diagnosis of influenza from Nasopharyngeal swab specimens and should not be used as a sole basis for treatment. Nasal washings and aspirates are unacceptable for Xpert Xpress SARS-CoV-2/FLU/RSV testing.  Fact Sheet for Patients: EntrepreneurPulse.com.au  Fact Sheet for Healthcare Providers: IncredibleEmployment.be  This test is not yet approved or cleared by the [place] FDA and has been authorized for detection and/or diagnosis of SARS-CoV-2 by FDA under an Emergency Use Authorization (EUA). This EUA will remain in effect (meaning this test can be used) for the duration of the COVID-19 declaration under Section 564(b)(1) of the Act, 21 U.S.C. section 360bbb-3(b)(1), unless the authorization is terminated or revoked.  Performed at [facility], [place] [address], [place], [place] [address]   Culture, blood (routine x 2)     Status: Abnormal   Collection Time: 11/21/20  8:55 PM   Specimen: BLOOD  Result Value Ref Range Status   Specimen Description BLOOD RIGHT HAND  Final   Special Requests   Final    BOTTLES DRAWN AEROBIC AND ANAEROBIC Blood Culture adequate volume    Culture  Setup Time   Final    GRAM POSITIVE COCCI IN CLUSTERS GRAM POSITIVE COCCI IN CHAINS AEROBIC BOTTLE ONLY CRITICAL RESULT CALLED TO, READ BACK BY AND VERIFIED WITH: [place] [id number] AT 50 BY CM    Culture (A)  Final    STREPTOCOCCUS MITIS/ORALIS STAPHYLOCOCCUS HOMINIS THE SIGNIFICANCE OF ISOLATING THIS ORGANISM FROM A SINGLE SET OF BLOOD CULTURES WHEN MULTIPLE SETS ARE DRAWN IS UNCERTAIN. PLEASE NOTIFY THE MICROBIOLOGY DEPARTMENT WITHIN ONE WEEK IF SPECIATION AND SENSITIVITIES ARE REQUIRED. Performed at [facility], [place] [address] [place], [place] [address]    Report Status 11/24/2020 FINAL  Final  Blood Culture ID Panel (Reflexed)     Status: Abnormal   Collection Time: 11/21/20  8:55 PM  Result Value Ref Range Status   Enterococcus faecalis NOT DETECTED NOT DETECTED Final   Enterococcus Faecium NOT DETECTED NOT DETECTED Final   Listeria monocytogenes NOT DETECTED NOT DETECTED Final   Staphylococcus species DETECTED (A) NOT DETECTED Final    Comment: CRITICAL RESULT CALLED TO, READ BACK BY AND VERIFIED WITH: PHARMD M MACCIA [id number] AT 1613 BY CM    Staphylococcus aureus (BCID) NOT DETECTED NOT DETECTED Final   Staphylococcus epidermidis NOT DETECTED NOT DETECTED Final   Staphylococcus lugdunensis NOT DETECTED NOT DETECTED Final   Streptococcus species DETECTED (A) NOT DETECTED Final    Comment: Not Enterococcus species, Streptococcus agalactiae, Streptococcus pyogenes, or Streptococcus pneumoniae. CRITICAL RESULT CALLED TO, READ BACK BY AND VERIFIED WITH: PHARMD M [place] [id number] AT 53 BY CM    Streptococcus agalactiae NOT DETECTED NOT DETECTED Final   Streptococcus pneumoniae NOT DETECTED NOT DETECTED Final  Streptococcus pyogenes NOT DETECTED NOT DETECTED Final   A.calcoaceticus-baumannii NOT DETECTED NOT DETECTED Final   Bacteroides fragilis NOT DETECTED NOT DETECTED Final   Enterobacterales NOT DETECTED NOT DETECTED Final   Enterobacter cloacae  complex NOT DETECTED NOT DETECTED Final   Escherichia coli NOT DETECTED NOT DETECTED Final   Klebsiella aerogenes NOT DETECTED NOT DETECTED Final   Klebsiella oxytoca NOT DETECTED NOT DETECTED Final   Klebsiella pneumoniae NOT DETECTED NOT DETECTED Final   Proteus species NOT DETECTED NOT DETECTED Final   Salmonella species NOT DETECTED NOT DETECTED Final   Serratia marcescens NOT DETECTED NOT DETECTED Final   Haemophilus influenzae NOT DETECTED NOT DETECTED Final   Neisseria meningitidis NOT DETECTED NOT DETECTED Final   Pseudomonas aeruginosa NOT DETECTED NOT DETECTED Final   Stenotrophomonas maltophilia NOT DETECTED NOT DETECTED Final   Candida albicans NOT DETECTED NOT DETECTED Final   Candida auris NOT DETECTED NOT DETECTED Final   Candida glabrata NOT DETECTED NOT DETECTED Final   Candida krusei NOT DETECTED NOT DETECTED Final   Candida parapsilosis NOT DETECTED NOT DETECTED Final   Candida tropicalis NOT DETECTED NOT DETECTED Final   Cryptococcus neoformans/gattii NOT DETECTED NOT DETECTED Final    Comment: Performed at [facility], [address], [place], [place] [address]  Culture, blood (routine x 2)     Status: None (Preliminary result)   Collection Time: 11/22/20  6:22 AM   Specimen: BLOOD  Result Value Ref Range Status   Specimen Description BLOOD LEFT ANTECUBITAL  Final   Special Requests AEROBIC BOTTLE ONLY Blood Culture adequate volume  Final   Culture   Final    NO GROWTH 3 DAYS Performed at [facility], [place] [address] [place], [place] [address]    Report Status PENDING  Incomplete  MRSA PCR Screening     Status: None   Collection Time: 11/22/20 10:55 AM   Specimen: Nasal Mucosa; Nasopharyngeal  Result Value Ref Range Status   MRSA by PCR NEGATIVE NEGATIVE Final    Comment:        The GeneXpert MRSA Assay (FDA approved for NASAL specimens only), is one component of a comprehensive MRSA colonization surveillance program. It is not intended to  diagnose MRSA infection nor to guide or monitor treatment for MRSA infections. Performed at [facility], [place] [address], [place], [place] [address]   SARS CORONAVIRUS 2 (TAT 6-24 HRS) Nasopharyngeal Nasopharyngeal Swab     Status: None   Collection Time: 11/25/20  2:20 PM   Specimen: Nasopharyngeal Swab  Result Value Ref Range Status   SARS Coronavirus 2 NEGATIVE NEGATIVE Final    Comment: (NOTE) SARS-CoV-2 target nucleic acids are NOT DETECTED.  The SARS-CoV-2 RNA is generally detectable in upper and lower respiratory specimens during the acute phase of infection. Negative results do not preclude SARS-CoV-2 infection, do not rule out co-infections with other pathogens, and should not be used as the sole basis for treatment or other patient management decisions. Negative results must be combined with clinical observations, patient history, and epidemiological information. The expected result is Negative.  Fact Sheet for Patients: SugarRoll.be  Fact Sheet for Healthcare Providers: https://www.woods-mathews.com/  This test is not yet approved or cleared by the [place] FDA and  has been authorized for detection and/or diagnosis of SARS-CoV-2 by FDA under an Emergency Use Authorization (EUA). This EUA will remain  in effect (meaning this test can be used) for the duration of the COVID-19 declaration under  Se ction 564(b)(1) of the Act, 21 U.S.C. section 360bbb-3(b)(1), unless the authorization is terminated or revoked sooner.  Performed at [facility], [place] [address] [place], [place] [address]      Labs: BNP (last 3 results) No results for input(s): BNP in the last 8760 hours. Basic Metabolic Panel: Recent Labs  Lab 11/21/20 1805 11/23/20 0542 11/24/20 0207  NA 140 140 139  K 4.0 3.6 4.2  CL 103 103 103  CO2 26 26 29   GLUCOSE 134* 99 113*  BUN 22 13 21   CREATININE 0.66 0.60 0.74  CALCIUM 8.9 8.8* 7.9*    Liver Function Tests: No results for input(s): AST, ALT, ALKPHOS, BILITOT, PROT, ALBUMIN in the last 168 hours. No results for input(s): LIPASE, AMYLASE in the last 168 hours. No results for input(s): AMMONIA in the last 168 hours. CBC: Recent Labs  Lab 11/21/20 1805 11/23/20 0542 11/24/20 0207 11/25/20 0501  WBC 10.8* 9.2 11.5* 8.5  NEUTROABS 9.4*  --   --   --   HGB 13.6 13.8 11.9* 12.0  HCT 43.4 42.7 37.7 37.1  MCV 92.9 91.6 93.3 93.9  PLT 216 199 222 194   Cardiac Enzymes: No results for input(s): CKTOTAL, CKMB, CKMBINDEX, TROPONINI in the last 168 hours. BNP: Invalid input(s): POCBNP CBG: No results for input(s): GLUCAP in the last 168 hours. D-Dimer No results for input(s): DDIMER in the last 72 hours. Hgb A1c No results for input(s): HGBA1C in the last 72 hours. Lipid Profile No results for input(s): CHOL, HDL, LDLCALC, TRIG, CHOLHDL, LDLDIRECT in the last 72 hours. Thyroid function studies No results for input(s): TSH, T4TOTAL, T3FREE, THYROIDAB in the last 72 hours.  Invalid input(s): FREET3 Anemia work up No results for input(s): VITAMINB12, FOLATE, FERRITIN, TIBC, IRON, RETICCTPCT in the last 72 hours. Urinalysis    Component Value Date/Time   COLORURINE YELLOW 10/18/2016 1811   APPEARANCEUR HAZY (A) 10/18/2016 1811   LABSPEC 1.018 10/18/2016 1811   PHURINE 5.0 10/18/2016 1811   GLUCOSEU NEGATIVE 10/18/2016 1811   HGBUR SMALL (A) 10/18/2016 1811   HGBUR trace-intact 06/28/2010 0916   BILIRUBINUR NEGATIVE 10/18/2016 1811   BILIRUBINUR small 12/18/2013 1220   KETONESUR 5 (A) 10/18/2016 1811   PROTEINUR NEGATIVE 10/18/2016 1811   UROBILINOGEN 0.2 12/18/2013 1220   UROBILINOGEN 1.0 06/28/2010 0916   NITRITE NEGATIVE 10/18/2016 1811   LEUKOCYTESUR NEGATIVE 10/18/2016 1811   Sepsis Labs Invalid input(s): PROCALCITONIN,  WBC,  LACTICIDVEN Microbiology Recent Results (from the past 240 hour(s))  Resp Panel by RT-PCR (Flu A&B, Covid) Nasopharyngeal  Swab     Status: None   Collection Time: 11/21/20  6:12 PM   Specimen: Nasopharyngeal Swab; Nasopharyngeal(NP) swabs in vial transport medium  Result Value Ref Range Status   SARS Coronavirus 2 by RT PCR NEGATIVE NEGATIVE Final    Comment: (NOTE) SARS-CoV-2 target nucleic acids are NOT DETECTED.  The SARS-CoV-2 RNA is generally detectable in upper respiratory specimens during the acute phase of infection. The lowest concentration of SARS-CoV-2 viral copies this assay can detect is 138 copies/mL. A negative result does not preclude SARS-Cov-2 infection and should not be used as the sole basis for treatment or other patient management decisions. A negative result may occur with  improper specimen collection/handling, submission of specimen other than nasopharyngeal swab, presence of viral mutation(s) within the areas targeted by this assay, and inadequate number of viral copies(<138 copies/mL). A negative result must be combined with clinical observations, patient history, and epidemiological information.  The expected result is Negative.  Fact Sheet for Patients:  EntrepreneurPulse.com.au  Fact Sheet for Healthcare Providers:  IncredibleEmployment.be  This test is no t yet approved or cleared by the [place] FDA and  has been authorized for detection and/or diagnosis of SARS-CoV-2 by FDA under an Emergency Use Authorization (EUA). This EUA will remain  in effect (meaning this test can be used) for the duration of the COVID-19 declaration under Section 564(b)(1) of the Act, 21 U.S.C.section 360bbb-3(b)(1), unless the authorization is terminated  or revoked sooner.       Influenza A by PCR NEGATIVE NEGATIVE Final   Influenza B by PCR NEGATIVE NEGATIVE Final    Comment: (NOTE) The Xpert Xpress SARS-CoV-2/FLU/RSV plus assay is intended as an aid in the diagnosis of influenza from Nasopharyngeal swab specimens and should not be used as a sole  basis for treatment. Nasal washings and aspirates are unacceptable for Xpert Xpress SARS-CoV-2/FLU/RSV testing.  Fact Sheet for Patients: EntrepreneurPulse.com.au  Fact Sheet for Healthcare Providers: IncredibleEmployment.be  This test is not yet approved or cleared by the [place] FDA and has been authorized for detection and/or diagnosis of SARS-CoV-2 by FDA under an Emergency Use Authorization (EUA). This EUA will remain in effect (meaning this test can be used) for the duration of the COVID-19 declaration under Section 564(b)(1) of the Act, 21 U.S.C. section 360bbb-3(b)(1), unless the authorization is terminated or revoked.  Performed at [facility], [place] [address], [place], [place] [address]   Culture, blood (routine x 2)     Status: Abnormal   Collection Time: 11/21/20  8:55 PM   Specimen: BLOOD  Result Value Ref Range Status   Specimen Description BLOOD RIGHT HAND  Final   Special Requests   Final    BOTTLES DRAWN AEROBIC AND ANAEROBIC Blood Culture adequate volume   Culture  Setup Time   Final    GRAM POSITIVE COCCI IN CLUSTERS GRAM POSITIVE COCCI IN CHAINS AEROBIC BOTTLE ONLY CRITICAL RESULT CALLED TO, READ BACK BY AND VERIFIED WITH: [place] [id number] AT 48 BY CM    Culture (A)  Final    STREPTOCOCCUS MITIS/ORALIS STAPHYLOCOCCUS HOMINIS THE SIGNIFICANCE OF ISOLATING THIS ORGANISM FROM A SINGLE SET OF BLOOD CULTURES WHEN MULTIPLE SETS ARE DRAWN IS UNCERTAIN. PLEASE NOTIFY THE MICROBIOLOGY DEPARTMENT WITHIN ONE WEEK IF SPECIATION AND SENSITIVITIES ARE REQUIRED. Performed at [facility], [place] [address] [place], [place] [address]    Report Status 11/24/2020 FINAL  Final  Blood Culture ID Panel (Reflexed)     Status: Abnormal   Collection Time: 11/21/20  8:55 PM  Result Value Ref Range Status   Enterococcus faecalis NOT DETECTED NOT DETECTED Final   Enterococcus Faecium NOT DETECTED NOT DETECTED Final    Listeria monocytogenes NOT DETECTED NOT DETECTED Final   Staphylococcus species DETECTED (A) NOT DETECTED Final    Comment: CRITICAL RESULT CALLED TO, READ BACK BY AND VERIFIED WITH: PHARMD M [place] [id number] AT 1613 BY CM    Staphylococcus aureus (BCID) NOT DETECTED NOT DETECTED Final   Staphylococcus epidermidis NOT DETECTED NOT DETECTED Final   Staphylococcus lugdunensis NOT DETECTED NOT DETECTED Final   Streptococcus species DETECTED (A) NOT DETECTED Final    Comment: Not Enterococcus species, Streptococcus agalactiae, Streptococcus pyogenes, or Streptococcus pneumoniae. CRITICAL RESULT CALLED TO, READ BACK BY AND VERIFIED WITH: PHARMD M [place] [id number] AT 70 BY CM    Streptococcus agalactiae NOT DETECTED NOT DETECTED Final   Streptococcus pneumoniae NOT DETECTED  NOT DETECTED Final   Streptococcus pyogenes NOT DETECTED NOT DETECTED Final   A.calcoaceticus-baumannii NOT DETECTED NOT DETECTED Final   Bacteroides fragilis NOT DETECTED NOT DETECTED Final   Enterobacterales NOT DETECTED NOT DETECTED Final   Enterobacter cloacae complex NOT DETECTED NOT DETECTED Final   Escherichia coli NOT DETECTED NOT DETECTED Final   Klebsiella aerogenes NOT DETECTED NOT DETECTED Final   Klebsiella oxytoca NOT DETECTED NOT DETECTED Final   Klebsiella pneumoniae NOT DETECTED NOT DETECTED Final   Proteus species NOT DETECTED NOT DETECTED Final   Salmonella species NOT DETECTED NOT DETECTED Final   Serratia marcescens NOT DETECTED NOT DETECTED Final   Haemophilus influenzae NOT DETECTED NOT DETECTED Final   Neisseria meningitidis NOT DETECTED NOT DETECTED Final   Pseudomonas aeruginosa NOT DETECTED NOT DETECTED Final   Stenotrophomonas maltophilia NOT DETECTED NOT DETECTED Final   Candida albicans NOT DETECTED NOT DETECTED Final   Candida auris NOT DETECTED NOT DETECTED Final   Candida glabrata NOT DETECTED NOT DETECTED Final   Candida krusei NOT DETECTED NOT DETECTED Final   Candida parapsilosis NOT  DETECTED NOT DETECTED Final   Candida tropicalis NOT DETECTED NOT DETECTED Final   Cryptococcus neoformans/gattii NOT DETECTED NOT DETECTED Final    Comment: Performed at [facility], [address] [place], [place] [address]  Culture, blood (routine x 2)     Status: None (Preliminary result)   Collection Time: 11/22/20  6:22 AM   Specimen: BLOOD  Result Value Ref Range Status   Specimen Description BLOOD LEFT ANTECUBITAL  Final   Special Requests AEROBIC BOTTLE ONLY Blood Culture adequate volume  Final   Culture   Final    NO GROWTH 3 DAYS Performed at [facility], [place] [address] [place], [place] [address]    Report Status PENDING  Incomplete  MRSA PCR Screening     Status: None   Collection Time: 11/22/20 10:55 AM   Specimen: Nasal Mucosa; Nasopharyngeal  Result Value Ref Range Status   MRSA by PCR NEGATIVE NEGATIVE Final    Comment:        The GeneXpert MRSA Assay (FDA approved for NASAL specimens only), is one component of a comprehensive MRSA colonization surveillance program. It is not intended to diagnose MRSA infection nor to guide or monitor treatment for MRSA infections. Performed at [facility], [place] [address] [place], [place] [address]   SARS CORONAVIRUS 2 (TAT 6-24 HRS) Nasopharyngeal Nasopharyngeal Swab     Status: None   Collection Time: 11/25/20  2:20 PM   Specimen: Nasopharyngeal Swab  Result Value Ref Range Status   SARS Coronavirus 2 NEGATIVE NEGATIVE Final    Comment: (NOTE) SARS-CoV-2 target nucleic acids are NOT DETECTED.  The SARS-CoV-2 RNA is generally detectable in upper and lower respiratory specimens during the acute phase of infection. Negative results do not preclude SARS-CoV-2 infection, do not rule out co-infections with other pathogens, and should not be used as the sole basis for treatment or other patient management decisions. Negative results must be combined with clinical observations, patient history, and  epidemiological information. The expected result is Negative.  Fact Sheet for Patients: SugarRoll.be  Fact Sheet for Healthcare Providers: https://www.woods-mathews.com/  This test is not yet approved or cleared by the [place] FDA and  has been authorized for detection and/or diagnosis of SARS-CoV-2 by FDA under an Emergency Use Authorization (EUA). This EUA will remain  in effect (meaning this test can be used) for the duration  of the COVID-19 declaration under Se ction 564(b)(1) of the Act, 21 U.S.C. section 360bbb-3(b)(1), unless the authorization is terminated or revoked sooner.  Performed at [facility], [place] [address] [place], [place] [address]    Time coordinating discharge: 40 mins   SIGNED:  Irwin Brakeman, MD  [facility] 11/26/2020, 11:09 AM How to contact the [facility] Attending or Consulting provider [employer] or covering provider during after hours [place], for this patient?  1. Check the care team in [facility] and look for a) attending/consulting TRH provider listed and b) the [facility] team listed 2. Log into [email or web address] and use [place] universal password to access. If you do not have the password, please contact the hospital operator. 3. Locate the [facility] provider you are looking for under [facility] and page to a number that you can be directly reached. 4. If you still have difficulty reaching the provider, please page the [facility] (Director on Call) for the Hospitalists listed on amion for assistance.

## 2020-11-26 NOTE — Plan of Care (Signed)
  Problem: Health Behavior/Discharge Planning: Goal: Ability to manage health-related needs will improve Outcome: Progressing   

## 2020-11-26 NOTE — TOC Transition Note (Signed)
Transition of Care [facility]) - CM/SW Discharge Note   Patient Details  Name: Dawn Woods MRN: [id number] Date of Birth: January 13, 1930  Transition of [facility] CM/SW Contact:  Gabrielle Dare Phone Number: 11/26/2020, 1:47 PM   Clinical Narrative:     Patient will Discharge To: [facility] Anticipated DC Date:11/26/20 Family Notified:yes, son Brayley Mackowiak, [phone] Transport VX:[id number]   Per MD patient ready for DC to [place]. RN, patient, patient's family, and facility notified of DC. Assessment, Fl2/Pasrr, and Discharge Summary sent to facility. RN given number for report [phone], Room # [phone]). DC packet on chart. Ambulance transport requested for patient.   CSW signing off.  Reed Breech LCSWA [phone]    Final next level of care: Memory Care (SNF/Memory Care) Barriers to Discharge: No Barriers Identified   Patient Goals and CMS Choice        Discharge Placement              Patient chooses bed at: [facility] Patient to be transferred to facility by: [place] Name of family member notified: Enid Derry, son Patient and family notified of of transfer: 11/26/20  Discharge Plan and Services                                     Social Determinants of Health (SDOH) Interventions     Readmission Risk Interventions No flowsheet data found.

## 2020-11-26 NOTE — Progress Notes (Signed)
Occupational Therapy Treatment Patient Details Name: Dawn Woods MRN: [id number] DOB: 1930-09-15 Today's Date: 11/26/2020    History of present illness 87 year old female with past medical history of dementia, COPD, chronic respiratory failure on 3 L of oxygen via nasal cannula, hypothyroidism, hypertension who presents to [facility] emergency department via EMS from [facility] skilled nursing facility status post fall with right hip pain. pt underwent R anterior approach hemiarthroplasy as surgical intervention on 11/23/20.   OT comments  Pt making progress with functional goals, although requires max encouragement to participate and becomes agitated with mobility in general. OT will continue to follow acutely to maximize level of function and safety  Follow Up Recommendations  SNF;Supervision/Assistance - 24 hour    Equipment Recommendations  3 in 1 bedside commode    Recommendations for Other Services      Precautions / Restrictions Precautions Precautions: Fall Precaution Comments: fall, dementia at baseline, anterior approach R hip Restrictions Weight Bearing Restrictions: Yes Other Position/Activity Restrictions: WBAT R LE       Mobility Bed Mobility Overal bed mobility: Needs Assistance Bed Mobility: Supine to Sit;Sit to Supine     Supine to sit: Max assist Sit to supine: Max assist   General bed mobility comments: maxA for bringing LEs to EOB and patient able to initiate trunk elevation however required assistance.    Transfers Overall transfer level: Needs assistance Equipment used: Rolling walker (2 wheeled) Transfers: Sit to/from Stand Sit to Stand: Max assist         General transfer comment: pt requird max encouragement to stand at RW, pt stood ~10 seconds before starting to sit back onto bed    Balance Overall balance assessment: Needs assistance Sitting-balance support: Feet supported Sitting balance-Leahy Scale: Fair Sitting balance -  Comments: min guard A, sat EOB x 2 minutes after standing at RW x 10 seconds   Standing balance support: Bilateral upper extremity supported;During functional activity Standing balance-Leahy Scale: Poor                             ADL either performed or assessed with clinical judgement   ADL Overall ADL's : Needs assistance/impaired Eating/Feeding: Set up;Supervision/ safety;Sitting;Bed level   Grooming: Wash/dry hands;Wash/dry face;Min guard;Sitting           Upper Body Dressing : Minimal assistance;Sitting Upper Body Dressing Details (indicate cue type and reason): donned clean gown                   General ADL Comments: pt required mod verbal cues to initiate tasks with min physical cues to initiate. Pt became agitated after returning to sittng for standing at RW at [facility] and declined any furhter activity. Pt's tray arrived and OT set her up to eat her lunch     Vision Baseline Vision/History: Wears glasses Patient Visual Report: No change from baseline     Perception     Praxis      Cognition Arousal/Alertness: Awake/alert Behavior During Therapy: Agitated Overall Cognitive Status: History of cognitive impairments - at baseline                                 General Comments: Oriented to person, place and situation. Pt became agitated this session after sitting EOB        Exercises     Shoulder Instructions  General Comments      Pertinent Vitals/ Pain       Pain Assessment: Faces Faces Pain Scale: Hurts little more Pain Location: with general movement Pain Descriptors / Indicators: Grimacing;Guarding;Moaning Pain Intervention(s): Limited activity within patient's tolerance;Monitored during session;Repositioned  Home Living                                          Prior Functioning/Environment              Frequency  Min 2X/week        Progress Toward Goals  OT Goals(current goals  can now be found in the care plan section)  Progress towards OT goals: Progressing toward goals     Plan Discharge plan remains appropriate    Co-evaluation                 AM-PAC OT "6 Clicks" Daily Activity     Outcome Measure   Help from another person eating meals?: None Help from another person taking care of personal grooming?: A Little Help from another person toileting, which includes using toliet, bedpan, or urinal?: Total Help from another person bathing (including washing, rinsing, drying)?: A Lot Help from another person to put on and taking off regular upper body clothing?: A Little Help from another person to put on and taking off regular lower body clothing?: Total 6 Click Score: 14    End of Session Equipment Utilized During Treatment: Gait belt;Rolling walker  OT Visit Diagnosis: Other abnormalities of gait and mobility (R26.89);Muscle weakness (generalized) (M62.81);History of falling (Z91.81);Other symptoms and signs involving cognitive function;Pain Pain - Right/Left: Right Pain - part of body: Hip;Leg   Activity Tolerance Patient limited by fatigue;Patient limited by pain   Patient Left in bed;with call bell/phone within reach;with bed alarm set   Nurse Communication Mobility status;Other (comment) (made NTs aware that pt can feed herself)        Time: 1203-1229 OT Time Calculation (min): 26 min  Charges: OT General Charges $OT Visit: 1 Visit OT Treatments $Self Care/Home Management : 8-22 mins $Therapeutic Activity: 8-22 mins     Britt Bottom 11/26/2020, 1:22 PM

## 2020-11-27 LAB — CULTURE, BLOOD (ROUTINE X 2)
Culture: NO GROWTH
Special Requests: ADEQUATE

## 2021-03-04 ENCOUNTER — Inpatient Hospital Stay (HOSPITAL_COMMUNITY)
Admission: EM | Admit: 2021-03-04 | Discharge: 2021-03-09 | DRG: 521 | Disposition: A | Discharge status: Skilled Nursing Facility | Payer: Medicare Other | Attending: Family Medicine | Admitting: Family Medicine

## 2021-03-04 ENCOUNTER — Other Ambulatory Visit: Payer: Self-pay

## 2021-03-04 ENCOUNTER — Emergency Department (HOSPITAL_COMMUNITY): Payer: Medicare Other

## 2021-03-04 ENCOUNTER — Inpatient Hospital Stay (HOSPITAL_COMMUNITY): Payer: Medicare Other

## 2021-03-04 ENCOUNTER — Encounter (HOSPITAL_COMMUNITY): Payer: Self-pay

## 2021-03-04 DIAGNOSIS — J9621 Acute and chronic respiratory failure with hypoxia: Secondary | ICD-10-CM | POA: Diagnosis present

## 2021-03-04 DIAGNOSIS — Z7951 Long term (current) use of inhaled steroids: Secondary | ICD-10-CM

## 2021-03-04 DIAGNOSIS — K219 Gastro-esophageal reflux disease without esophagitis: Secondary | ICD-10-CM | POA: Diagnosis present

## 2021-03-04 DIAGNOSIS — Z79890 Hormone replacement therapy (postmenopausal): Secondary | ICD-10-CM | POA: Diagnosis not present

## 2021-03-04 DIAGNOSIS — Z820 Family history of epilepsy and other diseases of the nervous system: Secondary | ICD-10-CM

## 2021-03-04 DIAGNOSIS — R609 Edema, unspecified: Secondary | ICD-10-CM | POA: Diagnosis not present

## 2021-03-04 DIAGNOSIS — Z87891 Personal history of nicotine dependence: Secondary | ICD-10-CM

## 2021-03-04 DIAGNOSIS — S72002A Fracture of unspecified part of neck of left femur, initial encounter for closed fracture: Secondary | ICD-10-CM | POA: Diagnosis not present

## 2021-03-04 DIAGNOSIS — Z20822 Contact with and (suspected) exposure to covid-19: Secondary | ICD-10-CM | POA: Diagnosis present

## 2021-03-04 DIAGNOSIS — Z888 Allergy status to other drugs, medicaments and biological substances status: Secondary | ICD-10-CM

## 2021-03-04 DIAGNOSIS — W19XXXA Unspecified fall, initial encounter: Secondary | ICD-10-CM | POA: Diagnosis present

## 2021-03-04 DIAGNOSIS — R54 Age-related physical debility: Secondary | ICD-10-CM | POA: Diagnosis present

## 2021-03-04 DIAGNOSIS — E039 Hypothyroidism, unspecified: Secondary | ICD-10-CM | POA: Diagnosis present

## 2021-03-04 DIAGNOSIS — S72012A Unspecified intracapsular fracture of left femur, initial encounter for closed fracture: Secondary | ICD-10-CM | POA: Diagnosis present

## 2021-03-04 DIAGNOSIS — G4700 Insomnia, unspecified: Secondary | ICD-10-CM | POA: Diagnosis present

## 2021-03-04 DIAGNOSIS — F0391 Unspecified dementia with behavioral disturbance: Secondary | ICD-10-CM | POA: Diagnosis present

## 2021-03-04 DIAGNOSIS — T148XXA Other injury of unspecified body region, initial encounter: Secondary | ICD-10-CM

## 2021-03-04 DIAGNOSIS — J9601 Acute respiratory failure with hypoxia: Secondary | ICD-10-CM | POA: Diagnosis not present

## 2021-03-04 DIAGNOSIS — J439 Emphysema, unspecified: Secondary | ICD-10-CM

## 2021-03-04 DIAGNOSIS — J9611 Chronic respiratory failure with hypoxia: Secondary | ICD-10-CM | POA: Diagnosis not present

## 2021-03-04 DIAGNOSIS — E785 Hyperlipidemia, unspecified: Secondary | ICD-10-CM | POA: Diagnosis present

## 2021-03-04 DIAGNOSIS — F32A Depression, unspecified: Secondary | ICD-10-CM | POA: Diagnosis present

## 2021-03-04 DIAGNOSIS — D62 Acute posthemorrhagic anemia: Secondary | ICD-10-CM | POA: Diagnosis not present

## 2021-03-04 DIAGNOSIS — Z66 Do not resuscitate: Secondary | ICD-10-CM | POA: Diagnosis present

## 2021-03-04 DIAGNOSIS — I421 Obstructive hypertrophic cardiomyopathy: Secondary | ICD-10-CM | POA: Diagnosis present

## 2021-03-04 DIAGNOSIS — I10 Essential (primary) hypertension: Secondary | ICD-10-CM | POA: Diagnosis present

## 2021-03-04 DIAGNOSIS — F03918 Unspecified dementia, unspecified severity, with other behavioral disturbance: Secondary | ICD-10-CM | POA: Diagnosis present

## 2021-03-04 DIAGNOSIS — E871 Hypo-osmolality and hyponatremia: Secondary | ICD-10-CM | POA: Diagnosis not present

## 2021-03-04 DIAGNOSIS — F0151 Vascular dementia with behavioral disturbance: Secondary | ICD-10-CM | POA: Diagnosis not present

## 2021-03-04 DIAGNOSIS — Y92129 Unspecified place in nursing home as the place of occurrence of the external cause: Secondary | ICD-10-CM

## 2021-03-04 DIAGNOSIS — E43 Unspecified severe protein-calorie malnutrition: Secondary | ICD-10-CM | POA: Diagnosis present

## 2021-03-04 DIAGNOSIS — Z8349 Family history of other endocrine, nutritional and metabolic diseases: Secondary | ICD-10-CM

## 2021-03-04 DIAGNOSIS — Z886 Allergy status to analgesic agent status: Secondary | ICD-10-CM | POA: Diagnosis not present

## 2021-03-04 DIAGNOSIS — Z79899 Other long term (current) drug therapy: Secondary | ICD-10-CM

## 2021-03-04 DIAGNOSIS — J449 Chronic obstructive pulmonary disease, unspecified: Secondary | ICD-10-CM | POA: Diagnosis present

## 2021-03-04 DIAGNOSIS — Z881 Allergy status to other antibiotic agents status: Secondary | ICD-10-CM

## 2021-03-04 DIAGNOSIS — Z8701 Personal history of pneumonia (recurrent): Secondary | ICD-10-CM | POA: Diagnosis not present

## 2021-03-04 LAB — RESP PANEL BY RT-PCR (FLU A&B, COVID) ARPGX2
Influenza A by PCR: NEGATIVE
Influenza B by PCR: NEGATIVE
SARS Coronavirus 2 by RT PCR: NEGATIVE

## 2021-03-04 LAB — BASIC METABOLIC PANEL
Anion gap: 11 (ref 5–15)
BUN: 18 mg/dL (ref 8–23)
CO2: 27 mmol/L (ref 22–32)
Calcium: 8.9 mg/dL (ref 8.9–10.3)
Chloride: 101 mmol/L (ref 98–111)
Creatinine, Ser: 0.74 mg/dL (ref 0.44–1.00)
GFR, Estimated: >60 mL/min (ref >60)
Glucose, Bld: 137 mg/dL — ABNORMAL HIGH (ref 70–99)
Potassium: 3.8 mmol/L (ref 3.5–5.1)
Sodium: 139 mmol/L (ref 135–145)

## 2021-03-04 LAB — SURGICAL PCR SCREEN
MRSA, PCR: NEGATIVE
Staphylococcus aureus: NEGATIVE

## 2021-03-04 LAB — CBC WITH DIFFERENTIAL/PLATELET
Abs Immature Granulocytes: 0.08 10*3/uL — ABNORMAL HIGH (ref 0.00–0.07)
Basophils Absolute: 0 10*3/uL (ref 0.0–0.1)
Basophils Relative: 0 %
Eosinophils Absolute: 0.1 10*3/uL (ref 0.0–0.5)
Eosinophils Relative: 1 %
HCT: 43.1 % (ref 36.0–46.0)
Hemoglobin: 13.7 g/dL (ref 12.0–15.0)
Immature Granulocytes: 1 %
Lymphocytes Relative: 7 %
Lymphs Abs: 0.9 10*3/uL (ref 0.7–4.0)
MCH: 30 pg (ref 26.0–34.0)
MCHC: 31.8 g/dL (ref 30.0–36.0)
MCV: 94.5 fL (ref 80.0–100.0)
Monocytes Absolute: 0.8 10*3/uL (ref 0.1–1.0)
Monocytes Relative: 7 %
Neutro Abs: 10.5 10*3/uL — ABNORMAL HIGH (ref 1.7–7.7)
Neutrophils Relative %: 84 %
Platelets: 252 10*3/uL (ref 150–400)
RBC: 4.56 MIL/uL (ref 3.87–5.11)
RDW: 13.8 % (ref 11.5–15.5)
WBC: 12.4 10*3/uL — ABNORMAL HIGH (ref 4.0–10.5)
nRBC: 0 % (ref 0.0–0.2)

## 2021-03-04 LAB — TYPE AND SCREEN
ABO/RH(D): O POS
Antibody Screen: NEGATIVE

## 2021-03-04 LAB — PROTIME-INR
INR: 1 (ref 0.8–1.2)
Prothrombin Time: 13 seconds (ref 11.4–15.2)

## 2021-03-04 MED ORDER — AMLODIPINE BESYLATE 10 MG PO TABS
10.0000 mg | ORAL_TABLET | Freq: Every morning | ORAL | Status: DC
  Administered 2021-03-05 – 2021-03-09 (×5): 10 mg via ORAL
  Filled 2021-03-04 (×5): qty 1

## 2021-03-04 MED ORDER — ONDANSETRON HCL 4 MG/2ML IJ SOLN: 4.0000 mg | Freq: Four times a day (QID) | INTRAMUSCULAR | Status: DC | PRN

## 2021-03-04 MED ORDER — MELATONIN 3 MG PO TABS
3.0000 mg | ORAL_TABLET | Freq: Every evening | ORAL | Status: DC | PRN
  Administered 2021-03-04 – 2021-03-05 (×2): 3 mg via ORAL
  Filled 2021-03-04 (×3): qty 1

## 2021-03-04 MED ORDER — SODIUM CHLORIDE 0.9% FLUSH
3.0000 mL | Freq: Two times a day (BID) | INTRAVENOUS | Status: DC
  Administered 2021-03-05 (×2): 3 mL via INTRAVENOUS

## 2021-03-04 MED ORDER — DIVALPROEX SODIUM 125 MG PO CSDR
125.0000 mg | DELAYED_RELEASE_CAPSULE | Freq: Two times a day (BID) | ORAL | Status: DC
  Administered 2021-03-04 – 2021-03-09 (×10): 125 mg via ORAL
  Filled 2021-03-04 (×11): qty 1

## 2021-03-04 MED ORDER — ONDANSETRON HCL 4 MG PO TABS: 4.0000 mg | ORAL_TABLET | Freq: Four times a day (QID) | ORAL | Status: DC | PRN

## 2021-03-04 MED ORDER — VITAMIN D (ERGOCALCIFEROL) 1.25 MG (50000 UNIT) PO CAPS: 50000.0000 [IU] | ORAL_CAPSULE | ORAL | Status: DC

## 2021-03-04 MED ORDER — ENSURE ENLIVE PO LIQD: 237.0000 mL | Freq: Two times a day (BID) | ORAL | Status: DC

## 2021-03-04 MED ORDER — FENTANYL CITRATE (PF) 100 MCG/2ML IJ SOLN
50.0000 ug | INTRAMUSCULAR | Status: DC | PRN
  Administered 2021-03-04: 50 ug via INTRAVENOUS
  Filled 2021-03-04: qty 2

## 2021-03-04 MED ORDER — LORAZEPAM 0.5 MG PO TABS
0.5000 mg | ORAL_TABLET | Freq: Two times a day (BID) | ORAL | Status: DC | PRN
  Administered 2021-03-06 – 2021-03-08 (×4): 0.5 mg via ORAL
  Filled 2021-03-04 (×4): qty 1

## 2021-03-04 MED ORDER — PANTOPRAZOLE SODIUM 40 MG PO TBEC
40.0000 mg | DELAYED_RELEASE_TABLET | Freq: Every morning | ORAL | Status: DC
  Administered 2021-03-05 – 2021-03-09 (×5): 40 mg via ORAL
  Filled 2021-03-04 (×4): qty 1

## 2021-03-04 MED ORDER — ONDANSETRON HCL 4 MG/2ML IJ SOLN
4.0000 mg | Freq: Once | INTRAMUSCULAR | Status: AC
  Administered 2021-03-04: 4 mg via INTRAVENOUS
  Filled 2021-03-04: qty 2

## 2021-03-04 MED ORDER — ACETAMINOPHEN 650 MG RE SUPP: 650.0000 mg | Freq: Four times a day (QID) | RECTAL | Status: DC | PRN

## 2021-03-04 MED ORDER — ESCITALOPRAM OXALATE 10 MG PO TABS
15.0000 mg | ORAL_TABLET | Freq: Every morning | ORAL | Status: DC
  Administered 2021-03-05 – 2021-03-09 (×5): 15 mg via ORAL
  Filled 2021-03-04 (×5): qty 2

## 2021-03-04 MED ORDER — LEVOTHYROXINE SODIUM 112 MCG PO TABS
112.0000 ug | ORAL_TABLET | Freq: Every day | ORAL | Status: DC
  Administered 2021-03-07 – 2021-03-09 (×3): 112 ug via ORAL
  Filled 2021-03-04 (×3): qty 1

## 2021-03-04 MED ORDER — METOPROLOL TARTRATE 5 MG/5ML IV SOLN: 5.0000 mg | Freq: Four times a day (QID) | INTRAVENOUS | Status: DC | PRN

## 2021-03-04 MED ORDER — ALBUTEROL SULFATE HFA 108 (90 BASE) MCG/ACT IN AERS
2.0000 | INHALATION_SPRAY | Freq: Four times a day (QID) | RESPIRATORY_TRACT | Status: DC | PRN
  Filled 2021-03-04: qty 6.7

## 2021-03-04 MED ORDER — ACETAMINOPHEN 325 MG PO TABS
650.0000 mg | ORAL_TABLET | Freq: Four times a day (QID) | ORAL | Status: DC | PRN
  Administered 2021-03-04 – 2021-03-05 (×3): 650 mg via ORAL
  Filled 2021-03-04 (×3): qty 2

## 2021-03-04 MED ORDER — KCL IN DEXTROSE-NACL 10-5-0.45 MEQ/L-%-% IV SOLN
INTRAVENOUS | Status: DC
  Filled 2021-03-04 (×2): qty 1000

## 2021-03-04 MED ORDER — MOMETASONE FURO-FORMOTEROL FUM 200-5 MCG/ACT IN AERO
2.0000 | INHALATION_SPRAY | Freq: Two times a day (BID) | RESPIRATORY_TRACT | Status: DC
  Administered 2021-03-04 – 2021-03-09 (×8): 2 via RESPIRATORY_TRACT
  Filled 2021-03-04: qty 8.8

## 2021-03-04 NOTE — H&P (Addendum)
History and Physical   Dawn Woods ZOX:[id number] DOB: March 16, 1930 DOA: 03/04/2021  Referring MD/NP/PA: Suella Broad, PA-C PCP: Marletta Lor, MD (Inactive) Patient coming from: [facility]  Chief Complaint: Fall, left hip fracture  HPI: Dawn Woods is an 87 y.o. female with a history of HTN, HLD, GERD, tobacco use, hypothyroidism, dementia, fall and right hip fracture s/p THA Feb 2022 who presented from her nursing facility this morning after sustaining a fall at that facility yesterday evening (~7:45pm) and having found a left hip fracture on X there. The patient's confusion limits history though she participates in ROS. Doesn't volunteer any complaints of pain or breathing trouble. Can't remember falling.   ED Course: Hip fracture confirmed at XR, CT head and cervical spine without acute fracture or dislocation. Labs remarkable only for WBC 12.4k. Fentanyl and zofran given, orthopedics consulted and plans arthroplasty 03/05/2021. Hospitalists called for admission.  Review of Systems: Denies fever, cough, sore throat, chest pain, palpitations, shortness of breath, abdominal pain, nausea, vomiting, dysuria and rash. Denies hip/leg pain, and per HPI. All others reviewed and are negative.   Past Medical History:  Diagnosis Date   GERD (gastroesophageal reflux disease)    H/O: hematuria 07/2010   Hyperlipidemia    Hypertension    Hypothyroidism    PONV (postoperative nausea and vomiting)    Tobacco abuse 01/04/2012   Past Surgical History:  Procedure Laterality Date   ANTERIOR APPROACH HEMI HIP ARTHROPLASTY Right 11/23/2020   Procedure: ANTERIOR APPROACH HEMI HIP ARTHROPLASTY;  Surgeon: Rod Can, MD;  Location: [place];  Service: Orthopedics;  Laterality: Right;   APPENDECTOMY     age 67   CHOLECYSTECTOMY     age 33   CYSTOSCOPY  11/23/2020   Procedure: Insertion of Foley Catheter;  Surgeon: Irine Seal, MD;  Location: [place];  Service: Urology;;   DILATION AND  CURETTAGE OF UTERUS     multiple's   ESOPHAGOGASTRODUODENOSCOPY N/A 08/06/2013   Procedure: ESOPHAGOGASTRODUODENOSCOPY (EGD);  Surgeon: Lear Ng, MD;  Location: [facility];  Service: Endoscopy;  Laterality: N/A;   FRACTURE SURGERY  Dec 2009   left wrist   OOPHORECTOMY  1954   TONSILLECTOMY     age 30   - Lives at [facility] since 11/26/2020 per paperwork  reports that she quit smoking about 7 years ago. Her smoking use included cigarettes. She has a 31.50 pack-year smoking history. She has never used smokeless tobacco. She reports that she does not drink alcohol and does not use drugs. Allergies  Allergen Reactions   Aspirin Other (See Comments)    GI Bleed   Ibuprofen Other (See Comments)    GI Bleed   Ciprofloxacin Hives   Family History  Problem Relation Age of Onset   Thyroid disease Mother    Dementia Mother    Thyroid disease Sister 106   - Family history otherwise reviewed and not pertinent. Prior to Admission medications   Medication Sig Start Date End Date Taking? Authorizing Provider  albuterol (VENTOLIN HFA) 108 (90 Base) MCG/ACT inhaler Inhale 2 puffs into the lungs every 6 (six) hours as needed for wheezing or shortness of breath.    [provider]  amLODipine (NORVASC) 10 MG tablet Take 10 mg by mouth every morning.    [provider]  budesonide-formoterol (SYMBICORT) 160-4.5 MCG/ACT inhaler Inhale 2 puffs into the lungs 2 (two) times daily.    [provider]  divalproex (DEPAKOTE SPRINKLE) 125 MG capsule Take 125 mg by mouth See  admin instructions. Take one capsule (125 mg) by mouth twice daily - morning and midday    [provider]  enoxaparin (LOVENOX) 40 MG/0.4ML injection Inject 0.4 mLs (40 mg total) into the skin daily. 11/25/20 12/25/20  Cherlynn June B, PA  escitalopram (LEXAPRO) 10 MG tablet Take 15 mg by mouth every morning.    [provider]  feeding supplement (ENSURE ENLIVE / ENSURE PLUS) LIQD Take  237 mLs by mouth 2 (two) times daily between meals. 11/26/20   Johnson, Clanford L, MD  Guaifenesin (MUCINEX MAXIMUM STRENGTH) 1200 MG TB12 Take 1,200 mg by mouth 2 (two) times daily.    [provider]  HYDROcodone-acetaminophen (NORCO/VICODIN) 5-325 MG tablet Take 2 tablets by mouth every 6 (six) hours as needed for moderate pain. 11/24/20   Dorothyann Peng, PA  levothyroxine (SYNTHROID) 112 MCG tablet Take 112 mcg by mouth every morning.    [provider]  loratadine (CLARITIN) 10 MG tablet Take 10 mg by mouth every morning.    [provider]  LORazepam (ATIVAN) 0.5 MG tablet Take 1 tablet (0.5 mg total) by mouth 2 (two) times daily as needed for anxiety (agitation). 11/26/20   Johnson, Clanford L, MD  melatonin 3 MG TABS tablet Take 3 mg by mouth at bedtime as needed (insomnia).    [provider]  pantoprazole (PROTONIX) 40 MG tablet Take 1 tablet (40 mg total) by mouth every morning. 11/26/20   Johnson, Clanford L, MD  polyethylene glycol (MIRALAX / GLYCOLAX) 17 g packet Take 17 g by mouth daily as needed for mild constipation. 11/26/20   Johnson, Clanford L, MD  Sodium Fluoride (PREVIDENT 5000 PLUS DT) Place 1 application onto teeth See admin instructions. Brush topically on teeth with a toothbrush after evening mouth care    [provider]  triamcinolone (NASACORT) 55 MCG/ACT AERO nasal inhaler Place 2 sprays into the nose at bedtime. 11/26/20   Murlean Iba, MD  Vitamin D, Ergocalciferol, (DRISDOL) 50000 units CAPS capsule Take 50,000 Units by mouth every Thursday.    [provider]    Physical Exam: Vitals:   03/04/21 1330 03/04/21 1345 03/04/21 1400 03/04/21 1415  BP: 137/76 (!) 156/89 131/90 (!) 148/80  Pulse: 85 87 87 87  Resp: (!) 22 (!) 24 (!) 24 (!) 23  Temp:      TempSrc:      SpO2: 95% 96%  (!) 86%   Constitutional: Elderly frail female in no distress, calm demeanor Eyes: Lids and conjunctivae normal, PERRL ENMT:  Mucous membranes are moist without bleeding. Posterior pharynx clear of any exudate or lesions. Fair dentition.  Neck: normal, supple, no masses, no thyromegaly Respiratory: Non-labored breathing supplemental oxygen without accessory muscle use. Clear breath sounds to auscultation bilaterally Cardiovascular: Regular rate and rhythm, no murmurs, rubs, or gallops. No carotid bruits. No JVD. No pitting LE edema. Palpable pedal pulses. Abdomen: Normoactive bowel sounds. No tenderness, non-distended, and no masses palpated. No hepatosplenomegaly. GU: No indwelling catheter Musculoskeletal: No clubbing / cyanosis. LLE shortened and externally rotated with tenderness to palpation at the hip. Sensation and motor function intact distally (limited ROM by pain).  Skin: Warm, dry. Sf previous PIV with dried blood, otherwise no rashes, wounds, or ulcers.   Neurologic: CN II-XII grossly intact. No focal deficits in motor strength or sensation in all extremities.  Psychiatric: Alert, not oriented. Impaired recall, behavior is currently appropriate though she's been requiring redirection frequently.  Labs on Admission: I have personally reviewed  following labs and imaging studies  CBC: Recent Labs  Lab 03/04/21 1303  WBC 12.4*  NEUTROABS 10.5*  HGB 13.7  HCT 43.1  MCV 94.5  PLT 967   Basic Metabolic Panel: Recent Labs  Lab 03/04/21 1303  NA 139  K 3.8  CL 101  CO2 27  GLUCOSE 137*  BUN 18  CREATININE 0.74  CALCIUM 8.9   GFR: CrCl cannot be calculated (Unknown ideal weight.). Liver Function Tests: No results for input(s): AST, ALT, ALKPHOS, BILITOT, PROT, ALBUMIN in the last 168 hours. No results for input(s): LIPASE, AMYLASE in the last 168 hours. No results for input(s): AMMONIA in the last 168 hours. Coagulation Profile: Recent Labs  Lab 03/04/21 1303  INR 1.0   Cardiac Enzymes: No results for input(s): CKTOTAL, CKMB, CKMBINDEX, TROPONINI in the last 168 hours. BNP (last 3  results) No results for input(s): PROBNP in the last 8760 hours. HbA1C: No results for input(s): HGBA1C in the last 72 hours. CBG: No results for input(s): GLUCAP in the last 168 hours. Lipid Profile: No results for input(s): CHOL, HDL, LDLCALC, TRIG, CHOLHDL, LDLDIRECT in the last 72 hours. Thyroid Function Tests: No results for input(s): TSH, T4TOTAL, FREET4, T3FREE, THYROIDAB in the last 72 hours. Anemia Panel: No results for input(s): VITAMINB12, FOLATE, FERRITIN, TIBC, IRON, RETICCTPCT in the last 72 hours. Urine analysis:    Component Value Date/Time   COLORURINE YELLOW 10/18/2016 1811   APPEARANCEUR HAZY (A) 10/18/2016 1811   LABSPEC 1.018 10/18/2016 1811   PHURINE 5.0 10/18/2016 1811   GLUCOSEU NEGATIVE 10/18/2016 1811   HGBUR SMALL (A) 10/18/2016 1811   HGBUR trace-intact 06/28/2010 0916   BILIRUBINUR NEGATIVE 10/18/2016 1811   BILIRUBINUR small 12/18/2013 1220   KETONESUR 5 (A) 10/18/2016 1811   PROTEINUR NEGATIVE 10/18/2016 1811   UROBILINOGEN 0.2 12/18/2013 1220   UROBILINOGEN 1.0 06/28/2010 0916   NITRITE NEGATIVE 10/18/2016 1811   LEUKOCYTESUR NEGATIVE 10/18/2016 1811    Recent Results (from the past 240 hour(s))  Resp Panel by RT-PCR (Flu A&B, Covid) Nasopharyngeal Swab     Status: None   Collection Time: 03/04/21  1:05 PM   Specimen: Nasopharyngeal Swab; Nasopharyngeal(NP) swabs in vial transport medium  Result Value Ref Range Status   SARS Coronavirus 2 by RT PCR NEGATIVE NEGATIVE Final    Comment: (NOTE) SARS-CoV-2 target nucleic acids are NOT DETECTED.  The SARS-CoV-2 RNA is generally detectable in upper respiratory specimens during the acute phase of infection. The lowest concentration of SARS-CoV-2 viral copies this assay can detect is 138 copies/mL. A negative result does not preclude SARS-Cov-2 infection and should not be used as the sole basis for treatment or other patient management decisions. A negative result may occur with  improper specimen  collection/handling, submission of specimen other than nasopharyngeal swab, presence of viral mutation(s) within the areas targeted by this assay, and inadequate number of viral copies(<138 copies/mL). A negative result must be combined with clinical observations, patient history, and epidemiological information. The expected result is Negative.  Fact Sheet for Patients:  EntrepreneurPulse.com.au  Fact Sheet for Healthcare Providers:  IncredibleEmployment.be  This test is no t yet approved or cleared by the [place] FDA and  has been authorized for detection and/or diagnosis of SARS-CoV-2 by FDA under an Emergency Use Authorization (EUA). This EUA will remain  in effect (meaning this test can be used) for the duration of the COVID-19 declaration under Section 564(b)(1) of the Act, 21 U.S.C.section 360bbb-3(b)(1), unless the authorization is terminated  or revoked sooner.       Influenza A by PCR NEGATIVE NEGATIVE Final   Influenza B by PCR NEGATIVE NEGATIVE Final    Comment: (NOTE) The Xpert Xpress SARS-CoV-2/FLU/RSV plus assay is intended as an aid in the diagnosis of influenza from Nasopharyngeal swab specimens and should not be used as a sole basis for treatment. Nasal washings and aspirates are unacceptable for Xpert Xpress SARS-CoV-2/FLU/RSV testing.  Fact Sheet for Patients: EntrepreneurPulse.com.au  Fact Sheet for Healthcare Providers: IncredibleEmployment.be  This test is not yet approved or cleared by the [place] FDA and has been authorized for detection and/or diagnosis of SARS-CoV-2 by FDA under an Emergency Use Authorization (EUA). This EUA will remain in effect (meaning this test can be used) for the duration of the COVID-19 declaration under Section 564(b)(1) of the Act, 21 U.S.C. section 360bbb-3(b)(1), unless the authorization is terminated or revoked.  Performed at [facility], [place] [address], [place], [place] [address]      Radiological Exams on Admission: CT Head Wo Contrast  Result Date: 03/04/2021 CLINICAL DATA:  Injury. EXAM: CT HEAD WITHOUT CONTRAST CT CERVICAL SPINE WITHOUT CONTRAST TECHNIQUE: Multidetector CT imaging of the head and cervical spine was performed following the standard protocol without intravenous contrast. Multiplanar CT image reconstructions of the cervical spine were also generated. COMPARISON:  Head and cervical spine CT scans 11/21/2020. FINDINGS: CT HEAD FINDINGS Brain: No evidence of acute infarction, hemorrhage, hydrocephalus, extra-axial collection or mass lesion/mass effect. Atrophy and chronic microvascular ischemic change again seen. Vascular: No hyperdense vessel or unexpected calcification. Skull: Intact.  No focal lesion. Sinuses/Orbits: Status post cataract surgery.  Otherwise negative. Other: None. CT CERVICAL SPINE FINDINGS Alignment: Maintained. Skull base and vertebrae: No acute fracture. No primary bone lesion or focal pathologic process. Soft tissues and spinal canal: No prevertebral fluid or swelling. No visible canal hematoma. Disc levels: Loss of disc space height and endplate spurring are most notable at C3-4, C5-6 and C6-7. Facet degenerative disease is worst on the left at C4-5. Upper chest: Extensive emphysematous disease is seen. Lung apices are clear. Other: None. IMPRESSION: No acute abnormality head or cervical spine. Atrophy and chronic microvascular ischemic change. Mild appearing cervical spondylosis. Emphysema (ICD10-J43.9). Electronically Signed   By: Inge Rise M.D.   On: 03/04/2021 13:11   CT Cervical Spine Wo Contrast  Result Date: 03/04/2021 CLINICAL DATA:  Injury. EXAM: CT HEAD WITHOUT CONTRAST CT CERVICAL SPINE WITHOUT CONTRAST TECHNIQUE: Multidetector CT imaging of the head and cervical spine was performed following the standard protocol without intravenous contrast. Multiplanar CT image  reconstructions of the cervical spine were also generated. COMPARISON:  Head and cervical spine CT scans 11/21/2020. FINDINGS: CT HEAD FINDINGS Brain: No evidence of acute infarction, hemorrhage, hydrocephalus, extra-axial collection or mass lesion/mass effect. Atrophy and chronic microvascular ischemic change again seen. Vascular: No hyperdense vessel or unexpected calcification. Skull: Intact.  No focal lesion. Sinuses/Orbits: Status post cataract surgery.  Otherwise negative. Other: None. CT CERVICAL SPINE FINDINGS Alignment: Maintained. Skull base and vertebrae: No acute fracture. No primary bone lesion or focal pathologic process. Soft tissues and spinal canal: No prevertebral fluid or swelling. No visible canal hematoma. Disc levels: Loss of disc space height and endplate spurring are most notable at C3-4, C5-6 and C6-7. Facet degenerative disease is worst on the left at C4-5. Upper chest: Extensive emphysematous disease is seen. Lung apices are clear. Other: None. IMPRESSION: No acute abnormality head or cervical spine. Atrophy and chronic microvascular ischemic  change. Mild appearing cervical spondylosis. Emphysema (ICD10-J43.9). Electronically Signed   By: Inge Rise M.D.   On: 03/04/2021 13:11   DG Hip Unilat With Pelvis 2-3 Views Left  Result Date: 03/04/2021 CLINICAL DATA:  Pain following fall EXAM: DG HIP (WITH OR WITHOUT PELVIS) 2-3V LEFT COMPARISON:  November 21, 2020 FINDINGS: Frontal pelvis as well as frontal and lateral left hip images were obtained. Bones are osteoporotic. There is a subcapital femoral neck fracture on the left with impaction at fracture site and mild varus angulation at the fracture site. No other fracture. No dislocation. Total hip replacement noted on the right. Mild narrowing left hip joint. There is lower lumbar dextroscoliosis. There are foci of arterial vascular calcification in the pelvis. IMPRESSION: Subcapital femoral neck fracture on the left with impaction and  varus angulation at fracture site. No other acute fracture. No dislocation. Mild narrowing left hip joint. Bones osteoporotic.  Status post total replacement on the right. Electronically Signed   By: Lowella Grip III M.D.   On: 03/04/2021 13:02    EKG: Independently reviewed. Significant baseline wander diffusely but NSR noted with no ST elevations.  Assessment/Plan Active Problems:   Hypothyroidism   GERD without esophagitis   Dementia with behavioral disturbance (HCC)   Depression   Essential hypertension   COPD (chronic obstructive pulmonary disease) (HCC)   Chronic respiratory failure with hypoxia (HCC)   Closed left hip fracture, initial encounter [facility])   Fall with traumatic closed left femoral neck fracture:  - Orthopedics consulted, Dr. Lyla Glassing (who performed right anterior hemiarthroplasty on 11/23/2020) is planning operative management 6/10 after discussions with HCPOA. Will make NPO p MN. Will defer VTE ppx to orthopedics.  - Continue vitamin D supplementation (50k qThursday, was given today) - PT/OT following surgery - Pain control per orthopedics. Note allergy to ibuprofen. - Also note reported history of PONV. The patient's HCPOA would prefer the patient undergo spinal anesthesia and thus would prefer to avoid anticoagulation prior to that procedure. Thus SCDs are ordered for VTE ppx pre-op.  Dementia with depression, insomnia:  - Continue home medications including escitalopram, depakote 125mg  BID, prn ativan, melatonin prn - Delirium precautions  Chronic hypoxic respiratory failure, bronchitis, former smoker, emphysema noted on CT on my personal review: On review of nursing facility MAR, 3 L O@ is included, so this is not acute. No exacerbation of bronchitis at this time. Covid negative. WBC 12.4k though no cough and this is explained as reactive to hip fracture. - Check dedicated CXR.  - Continue scheduled and prn bronchodilators - Wean oxygen as able (appears to  have needed this during admission in the past), aim for SpO2 88-95% given obstructive lung disease.  - Aggressive incentive spirometry recommended.  HTN:  - Continue norvasc  Hypothyroidism:  - Continue synthroid 165mcg  Moderate protein calorie malnutrition:  - Continue ensure (taking at NH)   GERD:  - Continue PPI  DVT prophylaxis: SCDs  Code Status: DNR. MOST form at bedside actually states the patient is comfort measures and is not to be brought to the ED unless symptoms can not be controlled at facility. These goals of care have changed and the patient will be admitted for operative management of hip fracture.  Family Communication: None at bedside. EDP has discussed case in length with HCPOA by phone. No changes to that plan currently. Orthopedics also to discuss with HCPOA. Disposition Plan: Return to facility after DC Consults called: Orthopedics, Dr. Lyla Glassing  Admission status: Inpatient  Patrecia Pour, MD [facility] [email or web address] 03/04/2021, 3:38 PM

## 2021-03-04 NOTE — ED Notes (Signed)
Xray at [facility]. NSL established L AC.

## 2021-03-04 NOTE — ED Triage Notes (Signed)
Pt BIB [facility] d/t fall that took place at 0400 at [facility]. Family initially did not want pt transported to hospital, after confirmation with facility obtaining a portable xray pt has a Lt hip Fx, pt was brought in for evaluation. EMS reports pt does have POA & DNR, all VSS. Hx of dementia, A/O to self upon arrival.

## 2021-03-04 NOTE — ED Provider Notes (Signed)
[facility] EMERGENCY DEPARTMENT Provider Note   CSN: [id number] Arrival date & time: 03/04/21  1108     History Chief Complaint  Patient presents with   Fall   Hip Fracture    Buffey Zabinski is a 87 y.o. female.  87 year old female with history of dementia brought in by EMS from [facility] for left hip pain after a fall last night. Per son via phone call (POA), patient was found on the floor at 7:45 last night. Patient is unable to provide a history but states she may have fallen out of bed. Her bed is in the lowest position with a mat on the floor, son states she climbs out of bed to use the bathroom regularly. Patient is not anticoagulated, was in this hospital 10/2020 for right hip fracture managed by Dr. Stann Mainland and Dr. Lyla Glassing. XR completed by mobile xray shows a left hip fracture, patient was sent to the ER for further management. NPO since 7:20am per notes from nursing home.  DNR and MOST form, XR report at bedside reviewed.       Past Medical History:  Diagnosis Date   GERD (gastroesophageal reflux disease)    H/O: hematuria 07/2010   Hyperlipidemia    Hypertension    Hypothyroidism    PONV (postoperative nausea and vomiting)    Tobacco abuse 01/04/2012    Patient Active Problem List   Diagnosis Date Noted   Closed left hip fracture, initial encounter ([place]) 03/04/2021   Foot fracture, right 11/22/2020   Closed right hip fracture ([place]) 11/21/2020   Essential hypertension 11/21/2020   Pneumonia of left lower lobe due to infectious organism 11/21/2020   Fall at home, initial encounter 11/21/2020   COPD (chronic obstructive pulmonary disease) ([place]) 11/21/2020   Chronic respiratory failure with hypoxia ([place]) 11/21/2020   Confusion 10/15/2016   Elevated troponin 10/15/2016   Bronchitis 12/02/2015   Left leg weakness 12/02/2015   Dementia with behavioral disturbance ([place]) 12/02/2015   Hypertrophic obstructive cardiomyopathy ([place]) 12/02/2015    Depression 12/02/2015   Malnutrition of moderate degree 11/26/2015   Acute respiratory failure with hypoxia ([place]) 11/26/2015   Acute encephalopathy 11/24/2015   Hypokalemia 11/24/2015   Dehydration 11/24/2015   Generalized weakness 11/24/2015   Lower urinary tract infectious disease    BPV (benign positional vertigo) 08/25/2014   Melena 08/05/2013   Personal history of colonic polyps 08/05/2013   Urinary tract infection 05/19/2010   Hypothyroidism 04/21/2008   Dyslipidemia 04/21/2008   GERD without esophagitis 04/21/2008    Past Surgical History:  Procedure Laterality Date   ANTERIOR APPROACH HEMI HIP ARTHROPLASTY Right 11/23/2020   Procedure: ANTERIOR APPROACH HEMI HIP ARTHROPLASTY;  Surgeon: Rod Can, MD;  Location: [facility];  Service: Orthopedics;  Laterality: Right;   APPENDECTOMY     age 80   CHOLECYSTECTOMY     age 54   CYSTOSCOPY  11/23/2020   Procedure: Insertion of Foley Catheter;  Surgeon: Irine Seal, MD;  Location: [facility];  Service: Urology;;   DILATION AND CURETTAGE OF UTERUS     multiple's   ESOPHAGOGASTRODUODENOSCOPY N/A 08/06/2013   Procedure: ESOPHAGOGASTRODUODENOSCOPY (EGD);  Surgeon: Lear Ng, MD;  Location: [facility];  Service: Endoscopy;  Laterality: N/A;   FRACTURE SURGERY  Dec 2009   left wrist   OOPHORECTOMY  1954   TONSILLECTOMY     age 25     OB History   No obstetric history on file.     Family History  Problem Relation Age of Onset   Thyroid disease Mother    Dementia Mother    Thyroid disease Sister 43    Social History   Tobacco Use   Smoking status: Former    Packs/day: 0.50    Years: 63.00    Pack years: 31.50    Types: Cigarettes    Quit date: 08/05/2013    Years since quitting: 7.5   Smokeless tobacco: Never  Substance Use Topics   Alcohol use: No    Alcohol/week: 0.0 standard drinks   Drug use: No    Home Medications Prior to Admission medications   Medication Sig Start Date End Date Taking?  Authorizing Provider  albuterol (VENTOLIN HFA) 108 (90 Base) MCG/ACT inhaler Inhale 2 puffs into the lungs every 6 (six) hours as needed for wheezing or shortness of breath.    [provider]  amLODipine (NORVASC) 10 MG tablet Take 10 mg by mouth every morning.    [provider]  budesonide-formoterol (SYMBICORT) 160-4.5 MCG/ACT inhaler Inhale 2 puffs into the lungs 2 (two) times daily.    [provider]  divalproex (DEPAKOTE SPRINKLE) 125 MG capsule Take 125 mg by mouth See admin instructions. Take one capsule (125 mg) by mouth twice daily - morning and midday    [provider]  enoxaparin (LOVENOX) 40 MG/0.4ML injection Inject 0.4 mLs (40 mg total) into the skin daily. 11/25/20 12/25/20  Cherlynn June B, PA  escitalopram (LEXAPRO) 10 MG tablet Take 15 mg by mouth every morning.    [provider]  feeding supplement (ENSURE ENLIVE / ENSURE PLUS) LIQD Take 237 mLs by mouth 2 (two) times daily between meals. 11/26/20   Johnson, Clanford L, MD  Guaifenesin (MUCINEX MAXIMUM STRENGTH) 1200 MG TB12 Take 1,200 mg by mouth 2 (two) times daily.    [provider]  HYDROcodone-acetaminophen (NORCO/VICODIN) 5-325 MG tablet Take 2 tablets by mouth every 6 (six) hours as needed for moderate pain. 11/24/20   Dorothyann Peng, PA  levothyroxine (SYNTHROID) 112 MCG tablet Take 112 mcg by mouth every morning.    [provider]  loratadine (CLARITIN) 10 MG tablet Take 10 mg by mouth every morning.    [provider]  LORazepam (ATIVAN) 0.5 MG tablet Take 1 tablet (0.5 mg total) by mouth 2 (two) times daily as needed for anxiety (agitation). 11/26/20   Johnson, Clanford L, MD  melatonin 3 MG TABS tablet Take 3 mg by mouth at bedtime as needed (insomnia).    [provider]  pantoprazole (PROTONIX) 40 MG tablet Take 1 tablet (40 mg total) by mouth every morning. 11/26/20   Johnson, Clanford L, MD  polyethylene glycol (MIRALAX / GLYCOLAX) 17 g  packet Take 17 g by mouth daily as needed for mild constipation. 11/26/20   Johnson, Clanford L, MD  Sodium Fluoride (PREVIDENT 5000 PLUS DT) Place 1 application onto teeth See admin instructions. Brush topically on teeth with a toothbrush after evening mouth care    [provider]  triamcinolone (NASACORT) 55 MCG/ACT AERO nasal inhaler Place 2 sprays into the nose at bedtime. 11/26/20   Murlean Iba, MD  Vitamin D, Ergocalciferol, (DRISDOL) 50000 units CAPS capsule Take 50,000 Units by mouth every Thursday.    [provider]    Allergies    Aspirin, Ibuprofen, and Ciprofloxacin  Review of Systems   Review of Systems  Unable to perform ROS: Dementia  Musculoskeletal:  Positive for arthralgias.   Physical Exam Updated Vital  Signs BP (!) 148/80   Pulse 87   Temp 97.7 F (36.5 C) (Oral)   Resp (!) 23   SpO2 (!) 86%   Physical Exam Vitals and nursing note reviewed.  Constitutional:      General: She is not in acute distress.    Appearance: She is not ill-appearing.  HENT:     Head: Normocephalic and atraumatic.     Nose: Nose normal.     Mouth/Throat:     Mouth: Mucous membranes are moist.  Eyes:     Extraocular Movements: Extraocular movements intact.     Pupils: Pupils are equal, round, and reactive to light.  Cardiovascular:     Rate and Rhythm: Normal rate and regular rhythm.     Pulses: Normal pulses.     Heart sounds: Normal heart sounds.  Pulmonary:     Effort: Pulmonary effort is normal.     Breath sounds: Normal breath sounds.  Abdominal:     Palpations: Abdomen is soft.     Tenderness: There is no abdominal tenderness.  Musculoskeletal:        General: Tenderness and signs of injury present.     Cervical back: Normal range of motion and neck supple. No tenderness.     Comments: Left hip pain, no pain with palpation left knee/ankle. Upper extremities range without pain. Bruising to left elbow  Skin:    General: Skin is warm and dry.      Findings: Bruising present. No erythema or rash.  Neurological:     General: No focal deficit present.     Mental Status: She is alert.  Psychiatric:        Behavior: Behavior normal.    ED Results / Procedures / Treatments   Labs (all labs ordered are listed, but only abnormal results are displayed) Labs Reviewed  BASIC METABOLIC PANEL - Abnormal; Notable for the following components:      Result Value   Glucose, Bld 137 (*)    All other components within normal limits  CBC WITH DIFFERENTIAL/PLATELET - Abnormal; Notable for the following components:   WBC 12.4 (*)    Neutro Abs 10.5 (*)    Abs Immature Granulocytes 0.08 (*)    All other components within normal limits  RESP PANEL BY RT-PCR (FLU A&B, COVID) [id number]  PROTIME-INR  TYPE AND SCREEN    EKG None  Radiology CT Head Wo Contrast  Result Date: 03/04/2021 CLINICAL DATA:  Injury. EXAM: CT HEAD WITHOUT CONTRAST CT CERVICAL SPINE WITHOUT CONTRAST TECHNIQUE: Multidetector CT imaging of the head and cervical spine was performed following the standard protocol without intravenous contrast. Multiplanar CT image reconstructions of the cervical spine were also generated. COMPARISON:  Head and cervical spine CT scans 11/21/2020. FINDINGS: CT HEAD FINDINGS Brain: No evidence of acute infarction, hemorrhage, hydrocephalus, extra-axial collection or mass lesion/mass effect. Atrophy and chronic microvascular ischemic change again seen. Vascular: No hyperdense vessel or unexpected calcification. Skull: Intact.  No focal lesion. Sinuses/Orbits: Status post cataract surgery.  Otherwise negative. Other: None. CT CERVICAL SPINE FINDINGS Alignment: Maintained. Skull base and vertebrae: No acute fracture. No primary bone lesion or focal pathologic process. Soft tissues and spinal canal: No prevertebral fluid or swelling. No visible canal hematoma. Disc levels: Loss of disc space height and endplate spurring are most notable at C3-4, C5-6 and C6-7.  Facet degenerative disease is worst on the left at C4-5. Upper chest: Extensive emphysematous disease is seen. Lung apices are clear. Other: None. IMPRESSION: No  acute abnormality head or cervical spine. Atrophy and chronic microvascular ischemic change. Mild appearing cervical spondylosis. Emphysema (ICD10-J43.9). Electronically Signed   By: Inge Rise M.D.   On: 03/04/2021 13:11   CT Cervical Spine Wo Contrast  Result Date: 03/04/2021 CLINICAL DATA:  Injury. EXAM: CT HEAD WITHOUT CONTRAST CT CERVICAL SPINE WITHOUT CONTRAST TECHNIQUE: Multidetector CT imaging of the head and cervical spine was performed following the standard protocol without intravenous contrast. Multiplanar CT image reconstructions of the cervical spine were also generated. COMPARISON:  Head and cervical spine CT scans 11/21/2020. FINDINGS: CT HEAD FINDINGS Brain: No evidence of acute infarction, hemorrhage, hydrocephalus, extra-axial collection or mass lesion/mass effect. Atrophy and chronic microvascular ischemic change again seen. Vascular: No hyperdense vessel or unexpected calcification. Skull: Intact.  No focal lesion. Sinuses/Orbits: Status post cataract surgery.  Otherwise negative. Other: None. CT CERVICAL SPINE FINDINGS Alignment: Maintained. Skull base and vertebrae: No acute fracture. No primary bone lesion or focal pathologic process. Soft tissues and spinal canal: No prevertebral fluid or swelling. No visible canal hematoma. Disc levels: Loss of disc space height and endplate spurring are most notable at C3-4, C5-6 and C6-7. Facet degenerative disease is worst on the left at C4-5. Upper chest: Extensive emphysematous disease is seen. Lung apices are clear. Other: None. IMPRESSION: No acute abnormality head or cervical spine. Atrophy and chronic microvascular ischemic change. Mild appearing cervical spondylosis. Emphysema (ICD10-J43.9). Electronically Signed   By: Inge Rise M.D.   On: 03/04/2021 13:11   DG Hip  Unilat With Pelvis 2-3 Views Left  Result Date: 03/04/2021 CLINICAL DATA:  Pain following fall EXAM: DG HIP (WITH OR WITHOUT PELVIS) 2-3V LEFT COMPARISON:  November 21, 2020 FINDINGS: Frontal pelvis as well as frontal and lateral left hip images were obtained. Bones are osteoporotic. There is a subcapital femoral neck fracture on the left with impaction at fracture site and mild varus angulation at the fracture site. No other fracture. No dislocation. Total hip replacement noted on the right. Mild narrowing left hip joint. There is lower lumbar dextroscoliosis. There are foci of arterial vascular calcification in the pelvis. IMPRESSION: Subcapital femoral neck fracture on the left with impaction and varus angulation at fracture site. No other acute fracture. No dislocation. Mild narrowing left hip joint. Bones osteoporotic.  Status post total replacement on the right. Electronically Signed   By: Lowella Grip III M.D.   On: 03/04/2021 13:02    Procedures Procedures   Medications Ordered in ED Medications  fentaNYL (SUBLIMAZE) injection 50 mcg (50 mcg Intravenous Given 03/04/21 1314)  ondansetron (ZOFRAN) injection 4 mg (4 mg Intravenous Given 03/04/21 1311)    ED Course  I have reviewed the triage vital signs and the nursing notes.  Pertinent labs & imaging results that were available during my care of the patient were reviewed by me and considered in my medical decision making (see chart for details).  Clinical Course as of 03/04/21 1453  Thu Mar 04, 7954  6460 87 year old female brought in by EMS from nursing facility for left hip pain after fall, possibly out of bed yesterday. X-ray at bedside reports Garden classification 3 acute left femoral neck fracture.  Add on CT head and C-spine for unwitnessed fall in a demented patient.  We will also order x-rays of the left hip for orthopedic review. Patient n.p.o. since 7:20 AM today. Call to patient's son and power of attorney Herbie [place] who  prefers to be called on the home phone number if needed.  Son  states that care was managed with Dr. Lyla Glassing, Dr. Stann Mainland, Dr. Tobias Alexander (anesthesiology) on her February 2022 right hip fracture.  He was very pleased with the care she received from this care team.  Son states that general anesthesia requested last time that she not receive Lovenox or heparin as this limits her options with surgery.  I have made note of this in her chart. [LM]  1407 Plan is to consult for admission once labs result.  [LM]  1407 Plan is for surgery tomorrow.  Case discussed with hospitalist who will consult for admission. [LM]    Clinical Course User Index [LM] Roque Lias   MDM Rules/Calculators/A&P                           Final Clinical Impression(s) / ED Diagnoses Final diagnoses:  Fall, initial encounter  Closed fracture of left hip, initial encounter [facility])    Rx / DC Orders ED Discharge Orders     None        Tacy Learn, PA-C 03/04/21 1453    Davonna Belling, MD 03/04/21 [phone]

## 2021-03-04 NOTE — ED Notes (Signed)
This RN entered room to assess pt due to VS alarming, pt resting and RN noted that the IV had been removed by patient and O2 had been removed as well. RN untaped IV and reapplied oxygen. Pt reminded to leave O2 on for safety and informed that a new IV would need to be placed.

## 2021-03-04 NOTE — Consult Note (Signed)
Reason for Consult:Left hip fx Referring Physician: Delphina Cahill Time called: [id number] Time at bedside: Dawn Woods is an 87 y.o. female.  HPI: Dawn Woods fell at the SNF where she resides yesterday. On location x-ray showed a femoral neck fx and she was brought to the ED today. She admits to left hip pain when asked but has moderate dementia and can't really contribute to history.   Past Medical History:  Diagnosis Date   GERD (gastroesophageal reflux disease)    H/O: hematuria 07/2010   Hyperlipidemia    Hypertension    Hypothyroidism    PONV (postoperative nausea and vomiting)    Tobacco abuse 01/04/2012    Past Surgical History:  Procedure Laterality Date   ANTERIOR APPROACH HEMI HIP ARTHROPLASTY Right 11/23/2020   Procedure: ANTERIOR APPROACH HEMI HIP ARTHROPLASTY;  Surgeon: Rod Can, MD;  Location: [facility];  Service: Orthopedics;  Laterality: Right;   APPENDECTOMY     age 3   CHOLECYSTECTOMY     age 78   CYSTOSCOPY  11/23/2020   Procedure: Insertion of Foley Catheter;  Surgeon: Irine Seal, MD;  Location: [facility];  Service: Urology;;   DILATION AND CURETTAGE OF UTERUS     multiple's   ESOPHAGOGASTRODUODENOSCOPY N/A 08/06/2013   Procedure: ESOPHAGOGASTRODUODENOSCOPY (EGD);  Surgeon: Lear Ng, MD;  Location: [facility];  Service: Endoscopy;  Laterality: N/A;   FRACTURE SURGERY  Dec 2009   left wrist   OOPHORECTOMY  1954   TONSILLECTOMY     age 52    Family History  Problem Relation Age of Onset   Thyroid disease Mother    Dementia Mother    Thyroid disease Sister 31    Social History:  reports that she quit smoking about 7 years ago. Her smoking use included cigarettes. She has a 31.50 pack-year smoking history. She has never used smokeless tobacco. She reports that she does not drink alcohol and does not use drugs.  Allergies:  Allergies  Allergen Reactions   Aspirin Other (See Comments)    GI Bleed   Ibuprofen Other (See Comments)     GI Bleed   Ciprofloxacin Hives    Medications: I have reviewed the patient's current medications.  No results found for this or any previous visit (from the past 48 hour(s)).  No results found.  Review of Systems  Unable to perform ROS: Dementia  Musculoskeletal:  Positive for arthralgias (Left hip).  Blood pressure (!) 141/86, pulse 91, temperature 97.7 F (36.5 C), temperature source Oral, resp. rate (!) 22, SpO2 95 %. Physical Exam Constitutional:      General: She is not in acute distress.    Appearance: She is well-developed. She is not diaphoretic.  HENT:     Head: Normocephalic and atraumatic.  Eyes:     General: No scleral icterus.       Right eye: No discharge.        Left eye: No discharge.     Conjunctiva/sclera: Conjunctivae normal.  Cardiovascular:     Rate and Rhythm: Normal rate and regular rhythm.  Pulmonary:     Effort: Pulmonary effort is normal. No respiratory distress.  Musculoskeletal:     Cervical back: Normal range of motion.     Comments: LLE No traumatic wounds, ecchymosis, or rash  Mild TTP hip  No knee or ankle effusion  Knee stable to varus/ valgus and anterior/posterior stress  Sens DPN, SPN, TN grossly intact  Motor EHL, ext, flex, evers 5/5  DP 1+, PT 0, No significant edema  Skin:    General: Skin is warm and dry.  Neurological:     Mental Status: She is alert.  Psychiatric:        Mood and Affect: Mood normal.        Behavior: Behavior normal.    Assessment/Plan: Left hip fx -- Will need hip hemi by Dr. Lyla Glassing. Plan for tomorrow afternoon. Please keep NPO after MN.    Lisette Abu, PA-C Orthopedic Surgery [phone] 03/04/2021, 1:01 PM

## 2021-03-04 NOTE — Plan of Care (Signed)

## 2021-03-04 NOTE — Plan of Care (Signed)

## 2021-03-04 NOTE — ED Notes (Signed)
CT came by & reported to this RN that their dept. Will bring pt to CT after xray before returning her to her room.

## 2021-03-04 NOTE — H&P (View-Only) (Signed)
Reason for Consult:Left hip fx Referring Physician: Delphina Cahill Time called: [phone] Time at bedside: [place] is an 87 y.o. female.  HPI: Dawn Woods fell at the SNF where she resides yesterday. On location x-ray showed a femoral neck fx and she was brought to the ED today. She admits to left hip pain when asked but has moderate dementia and can't really contribute to history.   Past Medical History:  Diagnosis Date   GERD (gastroesophageal reflux disease)    H/O: hematuria 07/2010   Hyperlipidemia    Hypertension    Hypothyroidism    PONV (postoperative nausea and vomiting)    Tobacco abuse 01/04/2012    Past Surgical History:  Procedure Laterality Date   ANTERIOR APPROACH HEMI HIP ARTHROPLASTY Right 11/23/2020   Procedure: ANTERIOR APPROACH HEMI HIP ARTHROPLASTY;  Surgeon: Rod Can, MD;  Location: [facility];  Service: Orthopedics;  Laterality: Right;   APPENDECTOMY     age 64   CHOLECYSTECTOMY     age 59   CYSTOSCOPY  11/23/2020   Procedure: Insertion of Foley Catheter;  Surgeon: Irine Seal, MD;  Location: [facility];  Service: Urology;;   DILATION AND CURETTAGE OF UTERUS     multiple's   ESOPHAGOGASTRODUODENOSCOPY N/A 08/06/2013   Procedure: ESOPHAGOGASTRODUODENOSCOPY (EGD);  Surgeon: Lear Ng, MD;  Location: [facility];  Service: Endoscopy;  Laterality: N/A;   FRACTURE SURGERY  Dec 2009   left wrist   OOPHORECTOMY  1954   TONSILLECTOMY     age 87    Family History  Problem Relation Age of Onset   Thyroid disease Mother    Dementia Mother    Thyroid disease Sister 20    Social History:  reports that she quit smoking about 7 years ago. Her smoking use included cigarettes. She has a 31.50 pack-year smoking history. She has never used smokeless tobacco. She reports that she does not drink alcohol and does not use drugs.  Allergies:  Allergies  Allergen Reactions   Aspirin Other (See Comments)    GI Bleed   Ibuprofen Other (See Comments)     GI Bleed   Ciprofloxacin Hives    Medications: I have reviewed the patient's current medications.  No results found for this or any previous visit (from the past 48 hour(s)).  No results found.  Review of Systems  Unable to perform ROS: Dementia  Musculoskeletal:  Positive for arthralgias (Left hip).  Blood pressure (!) 141/86, pulse 91, temperature 97.7 F (36.5 C), temperature source Oral, resp. rate (!) 22, SpO2 95 %. Physical Exam Constitutional:      General: She is not in acute distress.    Appearance: She is well-developed. She is not diaphoretic.  HENT:     Head: Normocephalic and atraumatic.  Eyes:     General: No scleral icterus.       Right eye: No discharge.        Left eye: No discharge.     Conjunctiva/sclera: Conjunctivae normal.  Cardiovascular:     Rate and Rhythm: Normal rate and regular rhythm.  Pulmonary:     Effort: Pulmonary effort is normal. No respiratory distress.  Musculoskeletal:     Cervical back: Normal range of motion.     Comments: LLE No traumatic wounds, ecchymosis, or rash  Mild TTP hip  No knee or ankle effusion  Knee stable to varus/ valgus and anterior/posterior stress  Sens DPN, SPN, TN grossly intact  Motor EHL, ext, flex, evers 5/5  DP 1+, PT 0, No significant edema  Skin:    General: Skin is warm and dry.  Neurological:     Mental Status: She is alert.  Psychiatric:        Mood and Affect: Mood normal.        Behavior: Behavior normal.    Assessment/Plan: Left hip fx -- Will need hip hemi by Dr. Lyla Glassing. Plan for tomorrow afternoon. Please keep NPO after MN.    Lisette Abu, PA-C Orthopedic Surgery [phone] 03/04/2021, 1:01 PM

## 2021-03-05 DIAGNOSIS — E43 Unspecified severe protein-calorie malnutrition: Secondary | ICD-10-CM | POA: Insufficient documentation

## 2021-03-05 LAB — CBC
HCT: 37.8 % (ref 36.0–46.0)
Hemoglobin: 11.9 g/dL — ABNORMAL LOW (ref 12.0–15.0)
MCH: 29.5 pg (ref 26.0–34.0)
MCHC: 31.5 g/dL (ref 30.0–36.0)
MCV: 93.8 fL (ref 80.0–100.0)
Platelets: 191 10*3/uL (ref 150–400)
RBC: 4.03 MIL/uL (ref 3.87–5.11)
RDW: 13.9 % (ref 11.5–15.5)
WBC: 9.4 10*3/uL (ref 4.0–10.5)
nRBC: 0 % (ref 0.0–0.2)

## 2021-03-05 LAB — BASIC METABOLIC PANEL
Anion gap: 6 (ref 5–15)
BUN: 18 mg/dL (ref 8–23)
CO2: 31 mmol/L (ref 22–32)
Calcium: 8.5 mg/dL — ABNORMAL LOW (ref 8.9–10.3)
Chloride: 101 mmol/L (ref 98–111)
Creatinine, Ser: 0.75 mg/dL (ref 0.44–1.00)
GFR, Estimated: >60 mL/min (ref >60)
Glucose, Bld: 133 mg/dL — ABNORMAL HIGH (ref 70–99)
Potassium: 4.1 mmol/L (ref 3.5–5.1)
Sodium: 138 mmol/L (ref 135–145)

## 2021-03-05 MED ORDER — POVIDONE-IODINE 10 % EX SWAB: 2.0000 "application " | Freq: Once | CUTANEOUS | Status: DC

## 2021-03-05 MED ORDER — POVIDONE-IODINE 10 % EX SWAB
2.0000 "application " | Freq: Once | CUTANEOUS | Status: AC
  Administered 2021-03-06: 2 via TOPICAL

## 2021-03-05 MED ORDER — ADULT MULTIVITAMIN W/MINERALS CH
1.0000 | ORAL_TABLET | Freq: Every day | ORAL | Status: DC
  Administered 2021-03-05 – 2021-03-09 (×4): 1 via ORAL
  Filled 2021-03-05 (×5): qty 1

## 2021-03-05 MED ORDER — ENSURE ENLIVE PO LIQD
237.0000 mL | Freq: Three times a day (TID) | ORAL | Status: DC
  Administered 2021-03-05 – 2021-03-09 (×9): 237 mL via ORAL

## 2021-03-05 MED ORDER — TRANEXAMIC ACID-NACL 1000-0.7 MG/100ML-% IV SOLN: 1000.0000 mg | INTRAVENOUS | Status: DC

## 2021-03-05 MED ORDER — DEXTROSE-NACL 5-0.45 % IV SOLN: INTRAVENOUS | Status: DC

## 2021-03-05 MED ORDER — CHLORHEXIDINE GLUCONATE 4 % EX LIQD: 60.0000 mL | Freq: Once | CUTANEOUS | Status: DC

## 2021-03-05 MED ORDER — CEFAZOLIN SODIUM-DEXTROSE 2-4 GM/100ML-% IV SOLN
2.0000 g | INTRAVENOUS | Status: AC
  Administered 2021-03-06: 2 g via INTRAVENOUS
  Filled 2021-03-05 (×2): qty 100

## 2021-03-05 NOTE — Anesthesia Preprocedure Evaluation (Addendum)
Anesthesia Evaluation  Patient identified by MRN, date of birth, ID band Patient awake    Reviewed: Allergy & Precautions, NPO status , Patient's Chart, lab work & pertinent test results  History of Anesthesia Complications (+) PONV  Airway Mallampati: II  TM Distance: >3 FB Neck ROM: Full    Dental  (+) Dental Advisory Given, Chipped   Pulmonary COPD,  COPD inhaler, former smoker,  03/04/2021 SARS coronavirus NEG   breath sounds clear to auscultation       Cardiovascular hypertension, (-) angina+ Valvular Problems/Murmurs AS  Rhythm:Regular Rate:Normal - Systolic murmurs ECHO: EF 60% to 65%. Wall motion was normal; there were no regional wall motion abnormalities. grade 1 DD, mild MR, mild AS   Neuro/Psych Depression Dementia    GI/Hepatic Neg liver ROS, GERD  Medicated and Controlled,  Endo/Other  Hypothyroidism   Renal/GU negative Renal ROS     Musculoskeletal   Abdominal   Peds  Hematology Hb 11.9, plt 191k   Anesthesia Other Findings   Reproductive/Obstetrics                            Anesthesia Physical Anesthesia Plan  ASA: 3  Anesthesia Plan: Spinal   Post-op Pain Management:    Induction:   PONV Risk Score and Plan: 3 and Ondansetron, Dexamethasone and Treatment may vary due to age or medical condition  Airway Management Planned: Natural Airway and Simple Face Mask  Additional Equipment: None  Intra-op Plan:   Post-operative Plan:   Informed Consent: I have reviewed the patients History and Physical, chart, labs and discussed the procedure including the risks, benefits and alternatives for the proposed anesthesia with the patient or authorized representative who has indicated his/her understanding and acceptance.   Patient has DNR.  Discussed DNR with power of attorney and Continue DNR.   Dental advisory given and Consent reviewed with POA  Plan Discussed with: CRNA  and Surgeon  Anesthesia Plan Comments: (Discussed with son, Herbie [place], who insists on SAB, however will allow GETA if unable to place SAB, or pt requires conversion to [place]. He wishes to continue the DNR perioperatively, however will allow intubation if conversion to GA required)       Anesthesia Quick Evaluation

## 2021-03-05 NOTE — Progress Notes (Signed)
Initial Nutrition Assessment  DOCUMENTATION CODES:  Severe malnutrition in context of chronic illness  INTERVENTION:  Ensure Enlive po TID, each supplement provides 350 kcal and 20 grams of protein.  Magic cup TID with meals, each supplement provides 290 kcal and 9 grams of protein.  Add MVI with minerals daily.  NUTRITION DIAGNOSIS:  Severe Malnutrition related to chronic illness as evidenced by severe fat depletion, severe muscle depletion.  GOAL:  Patient will meet greater than or equal to 90% of their needs  MONITOR:  PO intake, Supplement acceptance, Labs, Weight trends, Skin, I & O's  REASON FOR ASSESSMENT:  Malnutrition Screening Tool    ASSESSMENT:  86 yo female with a PMH of HTN, HLD, GERD, tobacco use, hypothyroidism, dementia, fall and right hip fracture s/p THA Feb 2022 who presented from her nursing facility 6/9 having been diagnosed with left hip fracture after unwitnessed fall at [facility].  Spoke with daughter at bedside as pt was asleep. Daughter reports that pt eats very little at her nursing home but it is "fine." She is unsure of what exactly she eats and of how much she truly does take in. Pt with dementia so unable to give much history.  Daughter reports no changes in her weight. Pt has not been weighed since 11/23/20 and her weight appears to be copied from 10/15/2016, so unsure if this is accurate. Recommend weighing patient. Will be using IBW to estimate needs.  On exam, pt severely depleted in almost all areas above the legs. Legs moderately depleted - daughter noted that pt has been mostly bed-bound since breaking her R hip in February 2022.  Recommend adding Ensure Enlive TID and Magic Cup TID to promote caloric and protein intake. Also recommend feeding assistance and MVI with minerals daily.  Medications: reviewed; Depakote, EE BID, Synthroid, Vitamin D3 50000 units weekly, D5 with NaCl IVF @ 75 ml/hr  Labs: reviewed; Glucose 133  NUTRITION -  FOCUSED PHYSICAL EXAM: Flowsheet Row Most Recent Value  Orbital Region Severe depletion  Upper Arm Region Severe depletion  Thoracic and Lumbar Region Moderate depletion  Buccal Region Severe depletion  Temple Region Severe depletion  Clavicle Bone Region Severe depletion  Clavicle and Acromion Bone Region Severe depletion  Scapular Bone Region Unable to assess  Dorsal Hand Severe depletion  Patellar Region Moderate depletion  Anterior Thigh Region Mild depletion  Posterior Calf Region Mild depletion  Edema (RD Assessment) Mild  [LLE]  Hair Reviewed  Eyes Reviewed  Mouth Reviewed  Skin Reviewed  Nails Reviewed   Diet Order:   Diet Order             Diet regular Room service appropriate? Yes; Fluid consistency: Thin  Diet effective now                  EDUCATION NEEDS:  Education needs have been addressed  Skin:  Skin Assessment: Reviewed RN Assessment  Last BM:  PTA/unknown  Height:  Ht Readings from Last 1 Encounters:  11/23/20 5\' 6"  (1.676 m)   Weight:  Wt Readings from Last 1 Encounters:  11/23/20 67.6 kg   Ideal Body Weight:  59.1 kg  BMI:  There is no height or weight on file to calculate BMI.  Estimated Nutritional Needs:  Kcal:    Protein:    Fluid:     Derrel Nip, RD, LDN Registered Dietitian I After-Hours/Weekend Pager # in [place]

## 2021-03-05 NOTE — Plan of Care (Signed)

## 2021-03-05 NOTE — TOC Progression Note (Signed)
Transition of Care [facility]) - Progression Note    Patient Details  Name: Dawn Woods MRN: [id number] Date of Birth: [birth year]  Transition of [facility]) CM/SW Contact  Milinda Antis, [place] Phone Number: 03/05/2021, 5:06 PM  Clinical Narrative:    CSW following patient for any d/c planning needs once medically stable.  Lind Covert, MSW, LCSWA         Expected Discharge Plan and Services                                                 Social Determinants of Health (SDOH) Interventions    Readmission Risk Interventions No flowsheet data found.

## 2021-03-05 NOTE — TOC CAGE-AID Note (Signed)
Transition of Care [facility]) - CAGE-AID Screening   Patient Details  Name: Dawn Woods MRN: [id number] Date of Birth: [birth year]   Elvina Sidle, RN Trauma Response Nurse Phone Number: [phone] 03/05/2021, 9:38 AM    CAGE-AID Screening:    Have You Ever Felt You Ought to Cut Down on Your Drinking or Drug Use?: No Have People Annoyed You By Critizing Your Drinking Or Drug Use?: No Have You Felt Bad Or Guilty About Your Drinking Or Drug Use?: No Have You Ever Had a Drink or Used Drugs First Thing In The Morning to Steady Your Nerves or to Get Rid of a Hangover?: No CAGE-AID Score: 0  Substance Abuse Education Offered: No (LIves in [place] skilled nursing- denies drinking alcohol or drug use)

## 2021-03-05 NOTE — Progress Notes (Signed)
PROGRESS NOTE  Dawn Woods  EXN:[id number] DOB: 11-08-1929 DOA: 03/04/2021 PCP: Marletta Lor, MD (Inactive)   Brief Narrative: Dawn Woods is an 87 y.o. female with a history of HTN, HLD, GERD, tobacco use, hypothyroidism, dementia, fall and right hip fracture s/p THA Feb 2022 who presented from her nursing facility 6/9 having been diagnosed with left hip fracture after unwitnessed fall at [facility]. VSS in ED. Hip fracture confirmed at XR, CT head and cervical spine without acute fracture or dislocation. Labs remarkable only for WBC 12.4k. Fentanyl and zofran given, orthopedics consulted and plans arthroplasty 03/05/2021.   Assessment & Plan: Active Problems:   Hypothyroidism   GERD without esophagitis   Dementia with behavioral disturbance (HCC)   Depression   Essential hypertension   COPD (chronic obstructive pulmonary disease) (HCC)   Chronic respiratory failure with hypoxia (HCC)   Closed left hip fracture, initial encounter [facility])  Fall with traumatic closed left femoral neck fracture: - Orthopedics consulted, Dr. Lyla Glassing (who performed right anterior hemiarthroplasty on 11/23/2020) is planning operative management 6/10 after discussions with HCPOA. Remain NPO. Will DC K supp in IVF. - HCPOA interested in spinal anesthesia if possible. I've held anticoagulation and alerted surgical team.  - Continue vitamin D supplementation (50k qThursday) - PT/OT following surgery - Pain control per orthopedics. Note allergy to ibuprofen.   Dementia with depression, insomnia: - Continue home medications including escitalopram, depakote 125mg  BID, prn ativan, melatonin prn - Delirium precautions   Chronic hypoxic respiratory failure, COPD:  - Remain on baseline 3L O2. CXR without infiltrate. Will need encouragement for IS frequently.  - Continue scheduled and prn bronchodilators   HTN: - Continue norvasc   Hypothyroidism: - Continue synthroid 16mcg   Moderate protein  calorie malnutrition: - Continue ensure (taking at NH)   GERD: - Continue PPI  DVT prophylaxis: SCDs Code Status: DNR Family Communication: HCPOA by phone 6/9, will reach out again today. Notified orthopedics that HCPOA would like to discuss surgery/anesthesia options prior to taken to OR today.  Disposition Plan:  Status is: Inpatient  Remains inpatient appropriate because: Requires operative management then adequate pain control prior to returning to NH.  Dispo: The patient is from: SNF              Anticipated d/c is to: SNF              Patient currently is not medically stable to d/c.   Difficult to place patient No  Consultants:  Orthopedics  Procedures:  TBD  Antimicrobials: None   Subjective: Pt confused, needed mittens last night, but no major events noted. She denies pain at rest.   Objective: Vitals:   03/04/21 2019 03/04/21 2213 03/05/21 0725 03/05/21 0841  BP: (!) 130/97  (!) 170/90   Pulse: 91  86   Resp: 16     Temp: 99.5 F (37.5 C)  98.7 F (37.1 C)   TempSrc: Oral  Oral   SpO2: 91% 92% 91% 94%    Intake/Output Summary (Last 24 hours) at 03/05/2021 1208 Last data filed at 03/05/2021 0500 Gross per 24 hour  Intake 452.8 ml  Output --  Net 452.8 ml   Gen: Frail elderly female in no distress  Pulm: Non-labored breathing supplemental O2. Clear to auscultation bilaterally.  CV: Regular rate and rhythm. No murmur, rub, or gallop. No JVD, no pedal edema. GI: Abdomen soft, non-tender, non-distended, with normoactive bowel sounds. No organomegaly or masses felt. Ext: Warm, LLE remains shortened and externally  rotated with mild tenderness to palpation hip. Skin: No rashes, lesions or ulcers Neuro: Alert and disoriented. MAEW Psych: Judgement and insight impaired. Behavior currently calm.  Data Reviewed: I have personally reviewed following labs and imaging studies  CBC: Recent Labs  Lab 03/04/21 1303 03/05/21 0435  WBC 12.4* 9.4  NEUTROABS  10.5*  --   HGB 13.7 11.9*  HCT 43.1 37.8  MCV 94.5 93.8  PLT 252 810   Basic Metabolic Panel: Recent Labs  Lab 03/04/21 1303 03/05/21 0435  NA 139 138  K 3.8 4.1  CL 101 101  CO2 27 31  GLUCOSE 137* 133*  BUN 18 18  CREATININE 0.74 0.75  CALCIUM 8.9 8.5*   Recent Results (from the past 240 hour(s))  Resp Panel by RT-PCR (Flu A&B, Covid) Nasopharyngeal Swab     Status: None   Collection Time: 03/04/21  1:05 PM   Specimen: Nasopharyngeal Swab; Nasopharyngeal(NP) swabs in vial transport medium  Result Value Ref Range Status   SARS Coronavirus 2 by RT PCR NEGATIVE NEGATIVE Final   Influenza A by PCR NEGATIVE NEGATIVE Final   Influenza B by PCR NEGATIVE NEGATIVE Final  Surgical pcr screen     Status: None   Collection Time: 03/04/21  5:41 PM   Specimen: Nasal Mucosa; Nasal Swab  Result Value Ref Range Status   MRSA, PCR NEGATIVE NEGATIVE Final   Staphylococcus aureus NEGATIVE NEGATIVE Final   Radiology Studies: CT Head Wo Contrast  Result Date: 03/04/2021 CLINICAL DATA:  Injury. EXAM: CT HEAD WITHOUT CONTRAST CT CERVICAL SPINE WITHOUT CONTRAST TECHNIQUE: Multidetector CT imaging of the head and cervical spine was performed following the standard protocol without intravenous contrast. Multiplanar CT image reconstructions of the cervical spine were also generated. COMPARISON:  Head and cervical spine CT scans 11/21/2020. FINDINGS: CT HEAD FINDINGS Brain: No evidence of acute infarction, hemorrhage, hydrocephalus, extra-axial collection or mass lesion/mass effect. Atrophy and chronic microvascular ischemic change again seen. Vascular: No hyperdense vessel or unexpected calcification. Skull: Intact.  No focal lesion. Sinuses/Orbits: Status post cataract surgery.  Otherwise negative. Other: None. CT CERVICAL SPINE FINDINGS Alignment: Maintained. Skull base and vertebrae: No acute fracture. No primary bone lesion or focal pathologic process. Soft tissues and spinal canal: No prevertebral  fluid or swelling. No visible canal hematoma. Disc levels: Loss of disc space height and endplate spurring are most notable at C3-4, C5-6 and C6-7. Facet degenerative disease is worst on the left at C4-5. Upper chest: Extensive emphysematous disease is seen. Lung apices are clear. Other: None. IMPRESSION: No acute abnormality head or cervical spine. Atrophy and chronic microvascular ischemic change. Mild appearing cervical spondylosis. Emphysema (ICD10-J43.9). Electronically Signed   By: Inge Rise M.D.   On: 03/04/2021 13:11   CT Cervical Spine Wo Contrast  Result Date: 03/04/2021 CLINICAL DATA:  Injury. EXAM: CT HEAD WITHOUT CONTRAST CT CERVICAL SPINE WITHOUT CONTRAST TECHNIQUE: Multidetector CT imaging of the head and cervical spine was performed following the standard protocol without intravenous contrast. Multiplanar CT image reconstructions of the cervical spine were also generated. COMPARISON:  Head and cervical spine CT scans 11/21/2020. FINDINGS: CT HEAD FINDINGS Brain: No evidence of acute infarction, hemorrhage, hydrocephalus, extra-axial collection or mass lesion/mass effect. Atrophy and chronic microvascular ischemic change again seen. Vascular: No hyperdense vessel or unexpected calcification. Skull: Intact.  No focal lesion. Sinuses/Orbits: Status post cataract surgery.  Otherwise negative. Other: None. CT CERVICAL SPINE FINDINGS Alignment: Maintained. Skull base and vertebrae: No acute fracture. No primary bone lesion or  focal pathologic process. Soft tissues and spinal canal: No prevertebral fluid or swelling. No visible canal hematoma. Disc levels: Loss of disc space height and endplate spurring are most notable at C3-4, C5-6 and C6-7. Facet degenerative disease is worst on the left at C4-5. Upper chest: Extensive emphysematous disease is seen. Lung apices are clear. Other: None. IMPRESSION: No acute abnormality head or cervical spine. Atrophy and chronic microvascular ischemic change.  Mild appearing cervical spondylosis. Emphysema (ICD10-J43.9). Electronically Signed   By: Inge Rise M.D.   On: 03/04/2021 13:11   DG CHEST PORT 1 VIEW  Result Date: 03/04/2021 CLINICAL DATA:  Fall, femur fracture. EXAM: PORTABLE CHEST 1 VIEW COMPARISON:  November 21, 2020. FINDINGS: The heart size and mediastinal contours are within normal limits. No pneumothorax is noted. Mild left basilar atelectasis is noted with small left pleural effusion. Stable scarring is noted throughout both lungs. The visualized skeletal structures are unremarkable. IMPRESSION: Mild left basilar subsegmental atelectasis or small left pleural effusion. Aortic Atherosclerosis (ICD10-I70.0). Electronically Signed   By: Marijo Conception M.D.   On: 03/04/2021 16:24   DG Hip Unilat With Pelvis 2-3 Views Left  Result Date: 03/04/2021 CLINICAL DATA:  Pain following fall EXAM: DG HIP (WITH OR WITHOUT PELVIS) 2-3V LEFT COMPARISON:  November 21, 2020 FINDINGS: Frontal pelvis as well as frontal and lateral left hip images were obtained. Bones are osteoporotic. There is a subcapital femoral neck fracture on the left with impaction at fracture site and mild varus angulation at the fracture site. No other fracture. No dislocation. Total hip replacement noted on the right. Mild narrowing left hip joint. There is lower lumbar dextroscoliosis. There are foci of arterial vascular calcification in the pelvis. IMPRESSION: Subcapital femoral neck fracture on the left with impaction and varus angulation at fracture site. No other acute fracture. No dislocation. Mild narrowing left hip joint. Bones osteoporotic.  Status post total replacement on the right. Electronically Signed   By: Lowella Grip III M.D.   On: 03/04/2021 13:02    Scheduled Meds:  amLODipine  10 mg Oral q morning   divalproex  125 mg Oral BID   escitalopram  15 mg Oral q morning   feeding supplement  237 mL Oral BID BM   levothyroxine  112 mcg Oral QAC breakfast    mometasone-formoterol  2 puff Inhalation BID   pantoprazole  40 mg Oral q morning   sodium chloride flush  3 mL Intravenous Q12H   [START ON 03/11/2021] Vitamin D (Ergocalciferol)  50,000 Units Oral Q Thu   Continuous Infusions:  dextrose 5 % and 0.45% NaCl       LOS: 1 day   Time spent: 25 minutes.  Patrecia Pour, MD [facility] [email or web address] 03/05/2021, 12:08 PM

## 2021-03-06 ENCOUNTER — Encounter (HOSPITAL_COMMUNITY): Payer: Self-pay | Admitting: Family Medicine

## 2021-03-06 ENCOUNTER — Inpatient Hospital Stay (HOSPITAL_COMMUNITY): Payer: Medicare Other

## 2021-03-06 ENCOUNTER — Inpatient Hospital Stay (HOSPITAL_COMMUNITY): Payer: Medicare Other | Admitting: Anesthesiology

## 2021-03-06 ENCOUNTER — Encounter (HOSPITAL_COMMUNITY)
Admission: EM | Disposition: A | Discharge status: Skilled Nursing Facility | Payer: Self-pay | Source: Home / Self Care | Attending: Family Medicine

## 2021-03-06 HISTORY — PX: ANTERIOR APPROACH HEMI HIP ARTHROPLASTY: SHX6690

## 2021-03-06 SURGERY — HEMIARTHROPLASTY, HIP, DIRECT ANTERIOR APPROACH, FOR FRACTURE
Anesthesia: Spinal | Laterality: Left

## 2021-03-06 MED ORDER — PHENOL 1.4 % MT LIQD: 1.0000 | OROMUCOSAL | Status: DC | PRN

## 2021-03-06 MED ORDER — FENTANYL CITRATE (PF) 250 MCG/5ML IJ SOLN
INTRAMUSCULAR | Status: AC
  Filled 2021-03-06: qty 5

## 2021-03-06 MED ORDER — BUPIVACAINE-EPINEPHRINE 0.5% -1:200000 IJ SOLN
INTRAMUSCULAR | Status: AC
  Filled 2021-03-06: qty 1

## 2021-03-06 MED ORDER — KETAMINE HCL 10 MG/ML IJ SOLN
INTRAMUSCULAR | Status: DC | PRN
  Administered 2021-03-06: 20 mg via INTRAVENOUS
  Administered 2021-03-06 (×2): 10 mg via INTRAVENOUS

## 2021-03-06 MED ORDER — BISACODYL 10 MG RE SUPP: 10.0000 mg | Freq: Every day | RECTAL | Status: DC | PRN

## 2021-03-06 MED ORDER — FLEET ENEMA 7-19 GM/118ML RE ENEM: 1.0000 | ENEMA | Freq: Once | RECTAL | Status: DC | PRN

## 2021-03-06 MED ORDER — HYDROCODONE-ACETAMINOPHEN 7.5-325 MG PO TABS
1.0000 | ORAL_TABLET | ORAL | Status: DC | PRN
  Administered 2021-03-08 – 2021-03-09 (×2): 1 via ORAL
  Filled 2021-03-06 (×2): qty 1

## 2021-03-06 MED ORDER — SODIUM CHLORIDE (PF) 0.9 % IJ SOLN
INTRAMUSCULAR | Status: DC | PRN
  Administered 2021-03-06: 30 mL via INTRAVENOUS

## 2021-03-06 MED ORDER — SENNA 8.6 MG PO TABS
1.0000 | ORAL_TABLET | Freq: Two times a day (BID) | ORAL | Status: DC
  Administered 2021-03-06 – 2021-03-09 (×6): 8.6 mg via ORAL
  Filled 2021-03-06 (×7): qty 1

## 2021-03-06 MED ORDER — PHENYLEPHRINE HCL-NACL 10-0.9 MG/250ML-% IV SOLN
INTRAVENOUS | Status: DC | PRN
  Administered 2021-03-06: 25 ug/min via INTRAVENOUS
  Administered 2021-03-06: 50 ug/min via INTRAVENOUS

## 2021-03-06 MED ORDER — METHOCARBAMOL 1000 MG/10ML IJ SOLN
500.0000 mg | Freq: Four times a day (QID) | INTRAVENOUS | Status: DC | PRN
  Filled 2021-03-06: qty 5

## 2021-03-06 MED ORDER — ONDANSETRON HCL 4 MG/2ML IJ SOLN: 4.0000 mg | Freq: Four times a day (QID) | INTRAMUSCULAR | Status: DC | PRN

## 2021-03-06 MED ORDER — PHENYLEPHRINE 40 MCG/ML (10ML) SYRINGE FOR IV PUSH (FOR BLOOD PRESSURE SUPPORT)
PREFILLED_SYRINGE | INTRAVENOUS | Status: DC | PRN
  Administered 2021-03-06 (×2): 80 ug via INTRAVENOUS

## 2021-03-06 MED ORDER — METOCLOPRAMIDE HCL 5 MG/ML IJ SOLN
5.0000 mg | Freq: Three times a day (TID) | INTRAMUSCULAR | Status: DC | PRN
Start: 2021-03-06 — End: 2021-03-09

## 2021-03-06 MED ORDER — PROPOFOL 500 MG/50ML IV EMUL
INTRAVENOUS | Status: DC | PRN
  Administered 2021-03-06: 25 ug/kg/min via INTRAVENOUS

## 2021-03-06 MED ORDER — DOCUSATE SODIUM 100 MG PO CAPS
100.0000 mg | ORAL_CAPSULE | Freq: Two times a day (BID) | ORAL | Status: DC
  Administered 2021-03-06 – 2021-03-09 (×6): 100 mg via ORAL
  Filled 2021-03-06 (×7): qty 1

## 2021-03-06 MED ORDER — ACETAMINOPHEN 325 MG PO TABS: 325.0000 mg | ORAL_TABLET | Freq: Four times a day (QID) | ORAL | Status: DC | PRN

## 2021-03-06 MED ORDER — ONDANSETRON HCL 4 MG/2ML IJ SOLN
INTRAMUSCULAR | Status: DC | PRN
  Administered 2021-03-06: 4 mg via INTRAVENOUS

## 2021-03-06 MED ORDER — DEXAMETHASONE SODIUM PHOSPHATE 10 MG/ML IJ SOLN
INTRAMUSCULAR | Status: DC | PRN
  Administered 2021-03-06: 10 mg via INTRAVENOUS

## 2021-03-06 MED ORDER — MENTHOL 3 MG MT LOZG: 1.0000 | LOZENGE | OROMUCOSAL | Status: DC | PRN

## 2021-03-06 MED ORDER — CHLORHEXIDINE GLUCONATE 0.12 % MT SOLN
15.0000 mL | Freq: Once | OROMUCOSAL | Status: DC
  Filled 2021-03-06: qty 15

## 2021-03-06 MED ORDER — FENTANYL CITRATE (PF) 100 MCG/2ML IJ SOLN: 25.0000 ug | INTRAMUSCULAR | Status: DC | PRN

## 2021-03-06 MED ORDER — MORPHINE SULFATE (PF) 2 MG/ML IV SOLN
0.5000 mg | INTRAVENOUS | Status: DC | PRN
  Administered 2021-03-08: 1 mg via INTRAVENOUS
  Filled 2021-03-06: qty 1

## 2021-03-06 MED ORDER — HYDROCODONE-ACETAMINOPHEN 5-325 MG PO TABS
1.0000 | ORAL_TABLET | ORAL | Status: DC | PRN
  Administered 2021-03-06 (×2): 2 via ORAL
  Administered 2021-03-07: 1 via ORAL
  Administered 2021-03-08: 2 via ORAL
  Filled 2021-03-06: qty 2
  Filled 2021-03-06: qty 1
  Filled 2021-03-06 (×2): qty 2
  Filled 2021-03-06: qty 1

## 2021-03-06 MED ORDER — SODIUM CHLORIDE 0.9 % IR SOLN
Status: DC | PRN
  Administered 2021-03-06: 1000 mL
  Administered 2021-03-06: 3000 mL

## 2021-03-06 MED ORDER — BUPIVACAINE HCL (PF) 0.5 % IJ SOLN
INTRAMUSCULAR | Status: DC | PRN
  Administered 2021-03-06: 15 mg via INTRATHECAL

## 2021-03-06 MED ORDER — IRRISEPT - 450ML BOTTLE WITH 0.05% CHG IN STERILE WATER, USP 99.95% OPTIME
TOPICAL | Status: DC | PRN
  Administered 2021-03-06: 450 mL

## 2021-03-06 MED ORDER — POLYETHYLENE GLYCOL 3350 17 G PO PACK: 17.0000 g | PACK | Freq: Every day | ORAL | Status: DC | PRN

## 2021-03-06 MED ORDER — KETOROLAC TROMETHAMINE 30 MG/ML IJ SOLN
INTRAMUSCULAR | Status: DC | PRN
  Administered 2021-03-06: 30 mg via INTRAVENOUS

## 2021-03-06 MED ORDER — KETAMINE HCL 50 MG/5ML IJ SOSY
PREFILLED_SYRINGE | INTRAMUSCULAR | Status: AC
  Filled 2021-03-06: qty 5

## 2021-03-06 MED ORDER — ONDANSETRON HCL 4 MG PO TABS: 4.0000 mg | ORAL_TABLET | Freq: Four times a day (QID) | ORAL | Status: DC | PRN

## 2021-03-06 MED ORDER — ENOXAPARIN SODIUM 40 MG/0.4ML IJ SOSY
40.0000 mg | PREFILLED_SYRINGE | INTRAMUSCULAR | Status: DC
  Administered 2021-03-07 – 2021-03-09 (×3): 40 mg via SUBCUTANEOUS
  Filled 2021-03-06 (×3): qty 0.4

## 2021-03-06 MED ORDER — KETOROLAC TROMETHAMINE 30 MG/ML IJ SOLN
INTRAMUSCULAR | Status: AC
  Filled 2021-03-06: qty 1

## 2021-03-06 MED ORDER — 0.9 % SODIUM CHLORIDE (POUR BTL) OPTIME
TOPICAL | Status: DC | PRN
  Administered 2021-03-06: 1000 mL

## 2021-03-06 MED ORDER — TRANEXAMIC ACID-NACL 1000-0.7 MG/100ML-% IV SOLN
1000.0000 mg | Freq: Once | INTRAVENOUS | Status: AC
  Administered 2021-03-06: 1000 mg via INTRAVENOUS
  Filled 2021-03-06: qty 100

## 2021-03-06 MED ORDER — ACETAMINOPHEN 500 MG PO TABS
500.0000 mg | ORAL_TABLET | Freq: Four times a day (QID) | ORAL | Status: AC
  Administered 2021-03-06 – 2021-03-07 (×2): 500 mg via ORAL
  Filled 2021-03-06 (×2): qty 1

## 2021-03-06 MED ORDER — METOCLOPRAMIDE HCL 5 MG PO TABS: 5.0000 mg | ORAL_TABLET | Freq: Three times a day (TID) | ORAL | Status: DC | PRN

## 2021-03-06 MED ORDER — METHOCARBAMOL 500 MG PO TABS: 500.0000 mg | ORAL_TABLET | Freq: Four times a day (QID) | ORAL | Status: DC | PRN

## 2021-03-06 MED ORDER — LACTATED RINGERS IV SOLN: INTRAVENOUS | Status: DC

## 2021-03-06 MED ORDER — BUPIVACAINE-EPINEPHRINE (PF) 0.5% -1:200000 IJ SOLN
INTRAMUSCULAR | Status: DC | PRN
  Administered 2021-03-06: 30 mL via PERINEURAL

## 2021-03-06 MED ORDER — CEFAZOLIN SODIUM-DEXTROSE 2-4 GM/100ML-% IV SOLN
2.0000 g | Freq: Four times a day (QID) | INTRAVENOUS | Status: AC
  Administered 2021-03-06 (×2): 2 g via INTRAVENOUS
  Filled 2021-03-06 (×2): qty 100

## 2021-03-06 SURGICAL SUPPLY — 68 items
ALCOHOL 70% 16 OZ (MISCELLANEOUS) ×3 IMPLANT
BLADE CLIPPER SURG (BLADE) ×2 IMPLANT
CHLORAPREP W/TINT 26 (MISCELLANEOUS) ×3 IMPLANT
COVER SURGICAL LIGHT HANDLE (MISCELLANEOUS) ×3 IMPLANT
COVER WAND RF STERILE (DRAPES) ×3 IMPLANT
DERMABOND ADHESIVE PROPEN (GAUZE/BANDAGES/DRESSINGS) ×2
DERMABOND ADVANCED (GAUZE/BANDAGES/DRESSINGS) ×4
DERMABOND ADVANCED .7 DNX12 (GAUZE/BANDAGES/DRESSINGS) ×2 IMPLANT
DERMABOND ADVANCED .7 DNX6 (GAUZE/BANDAGES/DRESSINGS) IMPLANT
DRAPE C-ARM 42X72 X-RAY (DRAPES) ×3 IMPLANT
DRAPE STERI IOBAN 125X83 (DRAPES) ×3 IMPLANT
DRAPE U-SHAPE 47X51 STRL (DRAPES) ×9 IMPLANT
DRESSING AQUACEL AG SP 3.5X10 (GAUZE/BANDAGES/DRESSINGS) IMPLANT
DRSG AQUACEL AG ADV 3.5X10 (GAUZE/BANDAGES/DRESSINGS) ×3 IMPLANT
DRSG AQUACEL AG SP 3.5X10 (GAUZE/BANDAGES/DRESSINGS) ×3
ELECT BLADE 4.0 EZ CLEAN MEGAD (MISCELLANEOUS) ×3
ELECT PENCIL ROCKER SW 15FT (MISCELLANEOUS) ×3 IMPLANT
ELECT REM PT RETURN 9FT ADLT (ELECTROSURGICAL) ×3
ELECTRODE BLDE 4.0 EZ CLN MEGD (MISCELLANEOUS) ×1 IMPLANT
ELECTRODE REM PT RTRN 9FT ADLT (ELECTROSURGICAL) ×1 IMPLANT
GLOVE BIO SURGEON STRL SZ8.5 (GLOVE) ×6 IMPLANT
GLOVE BIOGEL M 7.0 STRL (GLOVE) ×3 IMPLANT
GLOVE BIOGEL PI IND STRL 7.5 (GLOVE) ×1 IMPLANT
GLOVE BIOGEL PI IND STRL 8.5 (GLOVE) ×1 IMPLANT
GLOVE BIOGEL PI INDICATOR 7.5 (GLOVE) ×2
GLOVE BIOGEL PI INDICATOR 8.5 (GLOVE) ×2
GOWN STRL REUS W/ TWL LRG LVL3 (GOWN DISPOSABLE) ×2 IMPLANT
GOWN STRL REUS W/ TWL XL LVL3 (GOWN DISPOSABLE) ×1 IMPLANT
GOWN STRL REUS W/TWL 2XL LVL3 (GOWN DISPOSABLE) ×3 IMPLANT
GOWN STRL REUS W/TWL LRG LVL3 (GOWN DISPOSABLE) ×6
GOWN STRL REUS W/TWL XL LVL3 (GOWN DISPOSABLE) ×3
HANDPIECE INTERPULSE COAX TIP (DISPOSABLE) ×2
HEAD FEM UNIPOLAR 47 OD STRL (Hips) ×2 IMPLANT
HOOD PEEL AWAY FACE SHEILD DIS (HOOD) ×6 IMPLANT
JET LAVAGE IRRISEPT WOUND (IRRIGATION / IRRIGATOR) ×3
KIT BASIN OR (CUSTOM PROCEDURE TRAY) ×3 IMPLANT
KIT TURNOVER KIT B (KITS) ×3 IMPLANT
LAVAGE JET IRRISEPT WOUND (IRRIGATION / IRRIGATOR) ×1 IMPLANT
MANIFOLD NEPTUNE II (INSTRUMENTS) ×3 IMPLANT
MARKER SKIN DUAL TIP RULER LAB (MISCELLANEOUS) ×6 IMPLANT
NDL SPNL 18GX3.5 QUINCKE PK (NEEDLE) ×1 IMPLANT
NEEDLE SPNL 18GX3.5 QUINCKE PK (NEEDLE) ×3 IMPLANT
NS IRRIG 1000ML POUR BTL (IV SOLUTION) ×3 IMPLANT
PACK TOTAL JOINT (CUSTOM PROCEDURE TRAY) ×3 IMPLANT
PACK UNIVERSAL I (CUSTOM PROCEDURE TRAY) ×3 IMPLANT
PAD ARMBOARD 7.5X6 YLW CONV (MISCELLANEOUS) ×6 IMPLANT
SAW OSC TIP CART 19.5X105X1.3 (SAW) ×3 IMPLANT
SEALER BIPOLAR AQUA 6.0 (INSTRUMENTS) ×2 IMPLANT
SET HNDPC FAN SPRY TIP SCT (DISPOSABLE) ×1 IMPLANT
SOL PREP POV-IOD 4OZ 10% (MISCELLANEOUS) ×3 IMPLANT
SPACER DEPUY (Hips) ×2 IMPLANT
STEM TRI LOC BPS GRIP SZ9 OFFS IMPLANT
SUT ETHIBOND NAB CT1 #1 30IN (SUTURE) ×6 IMPLANT
SUT MNCRL AB 3-0 PS2 18 (SUTURE) ×3 IMPLANT
SUT MON AB 2-0 CT1 36 (SUTURE) ×3 IMPLANT
SUT VIC AB 1 CT1 27 (SUTURE) ×3
SUT VIC AB 1 CT1 27XBRD ANBCTR (SUTURE) ×1 IMPLANT
SUT VIC AB 2-0 CT1 27 (SUTURE) ×3
SUT VIC AB 2-0 CT1 TAPERPNT 27 (SUTURE) ×1 IMPLANT
SUT VLOC 180 0 24IN GS25 (SUTURE) ×3 IMPLANT
SYR 50ML LL SCALE MARK (SYRINGE) ×3 IMPLANT
TOWEL GREEN STERILE (TOWEL DISPOSABLE) ×3 IMPLANT
TOWEL GREEN STERILE FF (TOWEL DISPOSABLE) ×3 IMPLANT
TRAY CATH 16FR W/PLASTIC CATH (SET/KITS/TRAYS/PACK) ×2 IMPLANT
TRAY FOLEY W/BAG SLVR 16FR (SET/KITS/TRAYS/PACK) ×2
TRAY FOLEY W/BAG SLVR 16FR ST (SET/KITS/TRAYS/PACK) IMPLANT
TRI LOC BPS W/GRIP SZ 9 OFFS ×3 IMPLANT
WATER STERILE IRR 1000ML POUR (IV SOLUTION) ×9 IMPLANT

## 2021-03-06 NOTE — Op Note (Signed)
OPERATIVE REPORT  SURGEON: Rod Can, MD   ASSISTANT: Staff.  PREOPERATIVE DIAGNOSIS: Displaced Left femoral neck fracture.   POSTOPERATIVE DIAGNOSIS: Displaced Left femoral neck fracture.   PROCEDURE: Left hip hemiarthroplasty, anterior approach.   IMPLANTS: DePuy Tri Lock stem, size 9, hi offset, with a -3 mm spacer and a 47 mm monopolar head ball.  ANESTHESIA:  MAC and Spinal  ANTIBIOTICS: 2g ancef.  ESTIMATED BLOOD LOSS:-50 mL    DRAINS: None.  COMPLICATIONS: None   CONDITION: PACU - hemodynamically stable.   BRIEF CLINICAL NOTE: Dawn Woods is a 87 y.o. female with a displaced Left femoral neck fracture. The patient was admitted to the hospitalist service and underwent perioperative risk stratification and medical optimization. The risks, benefits, and alternatives to hemiarthroplasty were explained, and the patient elected to proceed.  PROCEDURE IN DETAIL: The patient was taken to the operating room and general anesthesia was induced on the hospital bed.  Foley catheter was placed by urology due to urethral edema. The patient was then positioned on the Hana table.  All bony prominences were well padded.  The hip was prepped and draped in the normal sterile surgical fashion.  A time-out was called verifying side and site of surgery. Antibiotics were given within 60 minutes of beginning the procedure.   Bikini incision was made, and te direct anterior approach to the hip was performed through the Hueter interval.  Lateral femoral circumflex vessels were treated with the Auqumantys. The anterior capsule was exposed and an inverted T capsulotomy was made.  Fracture hematoma was encountered and evacuated. The patient was found to have a comminuted Left subcapital femoral neck fracture.  I freshened the femoral neck cut with a saw.  I removed the femoral neck fragment.  A corkscrew was placed into the head and the head was removed.  This was passed to the back table and was  measured. The pubofemoral ligament was released subperiosteally to the lesser trochanter.   Acetabular exposure was achieved.  I examined the articular cartilage which was intact.  The labrum was intact. A 47 mm trial head was placed and found to have excellent fit.   I then gained femoral exposure taking care to protect the abductors and greater trochanter.  This was performed using standard external rotation, extension, and adduction.  The superior capsule was incised, taking care to stay lateral to the posterior border of the femoral neck. A cookie cutter was used to enter the femoral canal, and then the femoral canal finder was used to confirm location.  I then sequentially broached up to a size 9.  Calcar planer was used on the femoral neck remnant.  I paced a hi neck and a 36 + 1.5 head ball. The hip was reduced.  Leg lengths were checked fluoroscopically.  The hip was dislocated and trial components were removed.  I placed the real stem followed by the real spacer and head ball.  A single reduction maneuver was performed and the hip was reduced.  Fluoroscopy was used to confirm component position and leg lengths.  At 90 degrees of external rotation and extension, the hip was stable to an anterior directed force.   The wound was copiously irrigated with Irrisept solution and normal saline using pule lavage.  Marcaine solution was injected into the periarticular soft tissue.  The wound was closed in layers using #1 Vicryl and V-Loc for the fascia, 2-0 Vicryl for the subcutaneous fat, 2-0 Monocryl for the deep dermal layer, 3-0 running Monocryl  subcuticular stitch and glue for the skin.  Once the glue was fully dried, an Aquacell Ag dressing was applied.  The patient was then awakened from anesthesia and transported to the recovery room in stable condition.  Sponge, needle, and instrument counts were correct at the end of the case x2.  The patient tolerated the procedure well and there were no known  complications.  Please note that a surgical assistant was a medical necessity for this procedure to perform it in a safe and expeditious manner. Assistant was necessary to provide appropriate retraction of vital neurovascular structures, to prevent femoral fracture, and to allow for anatomic placement of the prosthesis.

## 2021-03-06 NOTE — Interval H&P Note (Signed)
History and Physical Interval Note:  03/06/2021 8:13 AM  Dawn Woods  has presented today for surgery, with the diagnosis of Left hip fracture.  The various methods of treatment have been discussed with the patient and family. After consideration of risks, benefits and other options for treatment, the patient has consented to  Procedure(s): ANTERIOR APPROACH HEMI HIP ARTHROPLASTY (Left) as a surgical intervention.  The patient's history has been reviewed, patient examined, no change in status, stable for surgery.  I have reviewed the patient's chart and labs.  Questions were answered to the patient's satisfaction.     Hilton Cork Warren Kugelman

## 2021-03-06 NOTE — Discharge Instructions (Addendum)
 Dr. Demondre Aguas Joint Replacement Specialist [facility] [address] [place], [place] [address] [phone]   HIP REPLACEMENT POSTOPERATIVE DIRECTIONS    Hip Rehabilitation, Guidelines Following Surgery   WEIGHT BEARING Weight bearing as tolerated with assist device (walker, cane, etc) as directed, use it as long as suggested by your surgeon or therapist, typically at least 4-6 weeks.  The results of a hip operation are greatly improved after range of motion and muscle strengthening exercises. Follow all safety measures which are given to protect your hip. If any of these exercises cause increased pain or swelling in your joint, decrease the amount until you are comfortable again. Then slowly increase the exercises. Call your caregiver if you have problems or questions.   HOME CARE INSTRUCTIONS  Most of the following instructions are designed to prevent the dislocation of your new hip.  Remove items at home which could result in a fall. This includes throw rugs or furniture in walking pathways.  Continue medications as instructed at time of discharge. You may have some home medications which will be placed on hold until you complete the course of blood thinner medication. You may start showering once you are discharged home. Do not remove your dressing. Do not put on socks or shoes without following the instructions of your caregivers.   Sit on chairs with arms. Use the chair arms to help push yourself up when arising.  Arrange for the use of a toilet seat elevator so you are not sitting low.  Walk with walker as instructed.  You may resume a sexual relationship in one month or when given the OK by your caregiver.  Use walker as long as suggested by your caregivers.  You may put full weight on your legs and walk as much as is comfortable. Avoid periods of inactivity such as sitting longer than an hour when not asleep. This helps prevent blood clots.   You may return to work once you are cleared by your surgeon.  Do not drive a car for 6 weeks or until released by your surgeon.  Do not drive while taking narcotics.  Wear elastic stockings for two weeks following surgery during the day but you may remove then at night.  Make sure you keep all of your appointments after your operation with all of your doctors and caregivers. You should call the office at the above phone number and make an appointment for approximately two weeks after the date of your surgery. Please pick up a stool softener and laxative for home use as long as you are requiring pain medications. ICE to the affected hip every three hours for 30 minutes at a time and then as needed for pain and swelling. Continue to use ice on the hip for pain and swelling from surgery. You may notice swelling that will progress down to the foot and ankle.  This is normal after surgery.  Elevate the leg when you are not up walking on it.   It is important for you to complete the blood thinner medication as prescribed by your doctor. Continue to use the breathing machine which will help keep your temperature down.  It is common for your temperature to cycle up and down following surgery, especially at night when you are not up moving around and exerting yourself.  The breathing machine keeps your lungs expanded and your temperature down.  RANGE OF MOTION AND STRENGTHENING EXERCISES  These exercises are designed to help you keep   full movement of your hip joint. Follow your caregiver's or physical therapist's instructions. Perform all exercises about fifteen times, three times per day or as directed. Exercise both hips, even if you have had only one joint replacement. These exercises can be done on a training (exercise) mat, on the floor, on a table or on a bed. Use whatever works the best and is most comfortable for you. Use music or television while you are exercising so that the exercises are a pleasant  break in your day. This will make your life better with the exercises acting as a break in routine you can look forward to.  Lying on your back, slowly slide your foot toward your buttocks, raising your knee up off the floor. Then slowly slide your foot back down until your leg is straight again.  Lying on your back spread your legs as far apart as you can without causing discomfort.  Lying on your side, raise your upper leg and foot straight up from the floor as far as is comfortable. Slowly lower the leg and repeat.  Lying on your back, tighten up the muscle in the front of your thigh (quadriceps muscles). You can do this by keeping your leg straight and trying to raise your heel off the floor. This helps strengthen the largest muscle supporting your knee.  Lying on your back, tighten up the muscles of your buttocks both with the legs straight and with the knee bent at a comfortable angle while keeping your heel on the floor.   SKILLED REHAB INSTRUCTIONS: If the patient is transferred to a skilled rehab facility following release from the hospital, a list of the current medications will be sent to the facility for the patient to continue.  When discharged from the skilled rehab facility, please have the facility set up the patient's Home Health Physical Therapy prior to being released. Also, the skilled facility will be responsible for providing the patient with their medications at time of release from the facility to include their pain medication and their blood thinner medication. If the patient is still at the rehab facility at time of the two week follow up appointment, the skilled rehab facility will also need to assist the patient in arranging follow up appointment in our office and any transportation needs.  POST-OPERATIVE OPIOID TAPER INSTRUCTIONS: It is important to wean off of your opioid medication as soon as possible. If you do not need pain medication after your surgery it is ok to stop  day one. Opioids include: Codeine, Hydrocodone(Norco, Vicodin), Oxycodone(Percocet, oxycontin) and hydromorphone amongst others.  Long term and even short term use of opiods can cause: Increased pain response Dependence Constipation Depression Respiratory depression And more.  Withdrawal symptoms can include Flu like symptoms Nausea, vomiting And more Techniques to manage these symptoms Hydrate well Eat regular healthy meals Stay active Use relaxation techniques(deep breathing, meditating, yoga) Do Not substitute Alcohol to help with tapering If you have been on opioids for less than two weeks and do not have pain than it is ok to stop all together.  Plan to wean off of opioids This plan should start within one week post op of your joint replacement. Maintain the same interval or time between taking each dose and first decrease the dose.  Cut the total daily intake of opioids by one tablet each day Next start to increase the time between doses. The last dose that should be eliminated is the evening dose.    MAKE SURE   YOU:  Understand these instructions.  Will watch your condition.  Will get help right away if you are not doing well or get worse.  Pick up stool softner and laxative for home use following surgery while on pain medications. Do not remove your dressing. The dressing is waterproof--it is OK to take showers. Continue to use ice for pain and swelling after surgery. Do not use any lotions or creams on the incision until instructed by your surgeon. Total Hip Protocol.  

## 2021-03-06 NOTE — Progress Notes (Signed)
PT Cancellation Note  Patient Details Name: Dawn Woods MRN: [id number] DOB: [birth year]   Cancelled Treatment:    Reason Eval/Treat Not Completed: Patient at procedure or test/unavailable as pt to OR for surgery today. PT will continue to follow and evaluate as appropriate.   Karma Ganja, PT, DPT   Acute Rehabilitation Department Pager #: [phone]   Otho Bellows 03/06/2021, 7:40 AM

## 2021-03-06 NOTE — Transfer of Care (Signed)
Immediate Anesthesia Transfer of Care Note  Patient: Tatyanna Cronk  Procedure(s) Performed: ANTERIOR APPROACH HEMI HIP ARTHROPLASTY (Left)  Patient Location: PACU  Anesthesia Type:MAC and Spinal  Level of Consciousness: awake and alert   Airway & Oxygen Therapy: Patient Spontanous Breathing and Patient connected to face mask oxygen  Post-op Assessment: Report given to RN and Post -op Vital signs reviewed and stable  Post vital signs: Reviewed and stable  Last Vitals:  Vitals Value Taken Time  BP 112/60 03/06/21 1033  Temp 36.3 C 03/06/21 1033  Pulse 60 03/06/21 1039  Resp 24 03/06/21 1039  SpO2 96 % 03/06/21 1039  Vitals shown include unvalidated device data.  Last Pain:  Vitals:   03/06/21 0642  TempSrc: Oral  PainSc: 0-No pain      Patients Stated Pain Goal: 3 (37/04/88 8916)  Complications: No notable events documented.

## 2021-03-06 NOTE — Progress Notes (Signed)
PROGRESS NOTE  [name]  QIW:[id number] DOB: 23-Jun-1930 DOA: 03/04/2021 PCP: Marletta Lor, MD (Inactive)   Brief Narrative: Dawn Woods is an 87 y.o. female with a history of HTN, HLD, GERD, tobacco use, hypothyroidism, dementia, fall and right hip fracture s/p THA Feb 2022 who presented from her nursing facility 6/9 having been diagnosed with left hip fracture after unwitnessed fall at [facility]. VSS in ED. Hip fracture confirmed at XR, CT head and cervical spine without acute fracture or dislocation. Labs remarkable only for WBC 12.4k which normalized without antimicrobials. Orthopedics performed left anterior hemiarthroplasty 03/06/2021.  Assessment & Plan: Active Problems:   Hypothyroidism   GERD without esophagitis   Dementia with behavioral disturbance (HCC)   Depression   Essential hypertension   COPD (chronic obstructive pulmonary disease) (HCC)   Chronic respiratory failure with hypoxia (HCC)   Closed left hip fracture, initial encounter (HCC)   Protein-calorie malnutrition, severe  Fall with traumatic closed left femoral neck fracture: - Orthopedics consulted, Dr. Lyla Glassing (who performed right anterior hemiarthroplasty on 11/23/2020) performed left hip anterior hemiarthroplasty on 03/06/2021 under spinal anesthesia.  - Continue vitamin D supplementation (50k qThursday) - PT/OT following surgery - Pain control per orthopedics. Note allergy to ibuprofen.   Dementia with depression, insomnia: - Continue home medications including escitalopram, depakote 125mg  BID, prn ativan, melatonin prn - Delirium precautions   Chronic hypoxic respiratory failure, COPD:  - Remain on baseline 3L O2. CXR without infiltrate. Will need encouragement for IS frequently.  - Continue scheduled and prn bronchodilators   HTN: - Continue norvasc   Hypothyroidism: - Continue synthroid 175mcg   Moderate protein calorie malnutrition: - Continue ensure (taking at NH)   GERD: -  Continue PPI  DVT prophylaxis: Will start lovenox Code Status: DNR Family Communication: HCPOA by phone 6/9, 6/10.   Disposition Plan:  Status is: Inpatient  Remains inpatient appropriate because: Requires operative management then adequate pain control prior to returning to NH.  Dispo: The patient is from: SNF              Anticipated d/c is to: SNF              Patient currently is not medically stable to d/c.   Difficult to place patient No  Consultants:  Orthopedics  Procedures:  Left hip hemiarthroplasty, anterior approach by Dr. Lyla Glassing 03/06/2021  Antimicrobials: None   Subjective: t drowsy but rousable after surgery denies pain.   Objective: Vitals:   03/06/21 1103 03/06/21 1118 03/06/21 1133 03/06/21 1201  BP: (!) 127/94 (!) 142/67 (!) 154/60 (!) 150/76  Pulse: 65 63 68 60  Resp: (!) 26 17 18 18   Temp:   (!) 97.5 F (36.4 C) (!) 97.5 F (36.4 C)  TempSrc:      SpO2: 96% 93% 96% 93%    Intake/Output Summary (Last 24 hours) at 03/06/2021 1335 Last data filed at 03/06/2021 1025 Gross per 24 hour  Intake 1000 ml  Output 50 ml  Net 950 ml   Gen: 87 y.o. female in no distress Pulm: Nonlabored breathing supplemental oxygen. Clear anterolaterally. CV: Regular rate and rhythm. No murmur, rub, or gallop. No JVD, no dependent edema. GI: Abdomen soft, non-tender, non-distended, with normoactive bowel sounds.  Ext: Warm, no deformities Skin: Anterior left hip with aquacel dressing c/d/I, appropriately tender to palpation. No other rashes, lesions or ulcers on visualized skin. Neuro: Rousable and responsive, not oriented without focal neurological deficits. Psych: Judgement and insight appear impaired. Calm.  Data Reviewed: I have personally reviewed following labs and imaging studies  CBC: Recent Labs  Lab 03/04/21 1303 03/05/21 0435  WBC 12.4* 9.4  NEUTROABS 10.5*  --   HGB 13.7 11.9*  HCT 43.1 37.8  MCV 94.5 93.8  PLT 252 962   Basic Metabolic  Panel: Recent Labs  Lab 03/04/21 1303 03/05/21 0435  NA 139 138  K 3.8 4.1  CL 101 101  CO2 27 31  GLUCOSE 137* 133*  BUN 18 18  CREATININE 0.74 0.75  CALCIUM 8.9 8.5*   Recent Results (from the past 240 hour(s))  Resp Panel by RT-PCR (Flu A&B, Covid) Nasopharyngeal Swab     Status: None   Collection Time: 03/04/21  1:05 PM   Specimen: Nasopharyngeal Swab; Nasopharyngeal(NP) swabs in vial transport medium  Result Value Ref Range Status   SARS Coronavirus 2 by RT PCR NEGATIVE NEGATIVE Final   Influenza A by PCR NEGATIVE NEGATIVE Final   Influenza B by PCR NEGATIVE NEGATIVE Final  Surgical pcr screen     Status: None   Collection Time: 03/04/21  5:41 PM   Specimen: Nasal Mucosa; Nasal Swab  Result Value Ref Range Status   MRSA, PCR NEGATIVE NEGATIVE Final   Staphylococcus aureus NEGATIVE NEGATIVE Final   Radiology Studies: Pelvis Portable  Result Date: 03/06/2021 CLINICAL DATA:  Left femoral neck fracture. EXAM: OPERATIVE left HIP (WITH PELVIS IF PERFORMED) 4 VIEWS TECHNIQUE: Fluoroscopic spot image(s) were submitted for interpretation post-operatively. COMPARISON:  Radiographs 03/04/2021 FINDINGS: Fluoroscopic spot images demonstrate placement of a bipolar left hip prosthesis. The femoral component is well seated. No complicating features. IMPRESSION: Bipolar left hip prosthesis and good position without complicating features. Electronically Signed   By: Marijo Sanes M.D.   On: 03/06/2021 11:58   DG CHEST PORT 1 VIEW  Result Date: 03/04/2021 CLINICAL DATA:  Fall, femur fracture. EXAM: PORTABLE CHEST 1 VIEW COMPARISON:  November 21, 2020. FINDINGS: The heart size and mediastinal contours are within normal limits. No pneumothorax is noted. Mild left basilar atelectasis is noted with small left pleural effusion. Stable scarring is noted throughout both lungs. The visualized skeletal structures are unremarkable. IMPRESSION: Mild left basilar subsegmental atelectasis or small left  pleural effusion. Aortic Atherosclerosis (ICD10-I70.0). Electronically Signed   By: Marijo Conception M.D.   On: 03/04/2021 16:24   DG C-Arm 1-60 Min  Result Date: 03/06/2021 CLINICAL DATA:  Left femoral neck fracture. EXAM: OPERATIVE left HIP (WITH PELVIS IF PERFORMED) 4 VIEWS TECHNIQUE: Fluoroscopic spot image(s) were submitted for interpretation post-operatively. COMPARISON:  Radiographs 03/04/2021 FINDINGS: Fluoroscopic spot images demonstrate placement of a bipolar left hip prosthesis. The femoral component is well seated. No complicating features. IMPRESSION: Bipolar left hip prosthesis and good position without complicating features. Electronically Signed   By: Marijo Sanes M.D.   On: 03/06/2021 11:58   DG HIP OPERATIVE UNILAT WITH PELVIS LEFT  Result Date: 03/06/2021 CLINICAL DATA:  Left femoral neck fracture. EXAM: OPERATIVE left HIP (WITH PELVIS IF PERFORMED) 4 VIEWS TECHNIQUE: Fluoroscopic spot image(s) were submitted for interpretation post-operatively. COMPARISON:  Radiographs 03/04/2021 FINDINGS: Fluoroscopic spot images demonstrate placement of a bipolar left hip prosthesis. The femoral component is well seated. No complicating features. IMPRESSION: Bipolar left hip prosthesis and good position without complicating features. Electronically Signed   By: Marijo Sanes M.D.   On: 03/06/2021 11:58    Scheduled Meds:  acetaminophen  500 mg Oral Q6H   amLODipine  10 mg Oral q morning   divalproex  125  mg Oral BID   docusate sodium  100 mg Oral BID   [START ON 03/07/2021] enoxaparin (LOVENOX) injection  40 mg Subcutaneous Q24H   escitalopram  15 mg Oral q morning   feeding supplement  237 mL Oral TID BM   levothyroxine  112 mcg Oral QAC breakfast   mometasone-formoterol  2 puff Inhalation BID   multivitamin with minerals  1 tablet Oral Daily   pantoprazole  40 mg Oral q morning   senna  1 tablet Oral BID   [START ON 03/11/2021] Vitamin D (Ergocalciferol)  50,000 Units Oral Q Thu    Continuous Infusions:   ceFAZolin (ANCEF) IV     dextrose 5 % and 0.45% NaCl 75 mL/hr at 03/05/21 1236   methocarbamol (ROBAXIN) IV     tranexamic acid       LOS: 2 days   Time spent: 25 minutes.  Patrecia Pour, MD [facility] [email or web address] 03/06/2021, 1:35 PM

## 2021-03-06 NOTE — Progress Notes (Signed)
OT Cancellation Note  Patient Details Name: Dawn Woods MRN: [id number] DOB: 02-10-1930   Cancelled Treatment:    Reason Eval/Treat Not Completed: Patient not medically ready.  Pt for surgery today.  Will check back.  Nilsa Nutting., OTR/L Acute Rehabilitation Services Pager [phone] Office [phone]   Lucille Passy M 03/06/2021, 7:21 AM

## 2021-03-06 NOTE — Anesthesia Postprocedure Evaluation (Signed)
Anesthesia Post Note  Patient: Ashantae Pangallo  Procedure(s) Performed: ANTERIOR APPROACH HEMI HIP ARTHROPLASTY (Left)     Patient location during evaluation: PACU Anesthesia Type: Spinal Level of consciousness: awake and alert and patient cooperative Pain management: pain level controlled Vital Signs Assessment: post-procedure vital signs reviewed and stable Respiratory status: nonlabored ventilation, spontaneous breathing, respiratory function stable and patient connected to nasal cannula oxygen Cardiovascular status: blood pressure returned to baseline and stable Postop Assessment: no apparent nausea or vomiting, patient able to bend at knees and spinal receding Anesthetic complications: no   No notable events documented.  Last Vitals:  Vitals:   03/06/21 1118 03/06/21 1133  BP: (!) 142/67 (!) 154/60  Pulse: 63 68  Resp: 17 18  Temp:    SpO2: 93% 96%    Last Pain:  Vitals:   03/06/21 1118  TempSrc:   PainSc: 0-No pain                 Ladye Macnaughton,E. Tashianna Broome

## 2021-03-06 NOTE — Anesthesia Procedure Notes (Signed)
Spinal  Patient location during procedure: OR End time: 03/06/2021 8:26 AM Reason for block: surgical anesthesia Staffing Performed: anesthesiologist  Anesthesiologist: Annye Asa, MD Preanesthetic Checklist Completed: patient identified, IV checked, site marked, risks and benefits discussed, surgical consent, monitors and equipment checked, pre-op evaluation and timeout performed Spinal Block Patient position: sitting Prep: DuraPrep and site prepped and draped Patient monitoring: blood pressure, continuous pulse ox, cardiac monitor and heart rate Approach: midline Location: L3-4 Injection technique: single-shot Needle Needle type: Pencan and Introducer  Needle gauge: 24 G Needle length: 9 cm Assessment Events: CSF return Additional Notes Pt identified in Operating room.  Monitors applied. Working IV access confirmed. Sterile prep, drape lumbar spine.  1% lido local L 3,4.  #24ga Pencan into clear CSF L 3,4.  15mg  0.5% Bupivacaine injected with asp CSF beginning and end of injection.  Patient asymptomatic, VSS, no heme aspirated, tolerated well.  Jenita Seashore, MD

## 2021-03-07 LAB — CBC
HCT: 35.4 % — ABNORMAL LOW (ref 36.0–46.0)
Hemoglobin: 11 g/dL — ABNORMAL LOW (ref 12.0–15.0)
MCH: 29.4 pg (ref 26.0–34.0)
MCHC: 31.1 g/dL (ref 30.0–36.0)
MCV: 94.7 fL (ref 80.0–100.0)
Platelets: 237 10*3/uL (ref 150–400)
RBC: 3.74 MIL/uL — ABNORMAL LOW (ref 3.87–5.11)
RDW: 13.5 % (ref 11.5–15.5)
WBC: 12.4 10*3/uL — ABNORMAL HIGH (ref 4.0–10.5)
nRBC: 0 % (ref 0.0–0.2)

## 2021-03-07 LAB — BASIC METABOLIC PANEL
Anion gap: 6 (ref 5–15)
BUN: 23 mg/dL (ref 8–23)
CO2: 30 mmol/L (ref 22–32)
Calcium: 8.3 mg/dL — ABNORMAL LOW (ref 8.9–10.3)
Chloride: 98 mmol/L (ref 98–111)
Creatinine, Ser: 0.78 mg/dL (ref 0.44–1.00)
GFR, Estimated: >60 mL/min (ref >60)
Glucose, Bld: 135 mg/dL — ABNORMAL HIGH (ref 70–99)
Potassium: 4.9 mmol/L (ref 3.5–5.1)
Sodium: 134 mmol/L — ABNORMAL LOW (ref 135–145)

## 2021-03-07 MED ORDER — DEXTROSE-NACL 5-0.9 % IV SOLN: INTRAVENOUS | Status: DC

## 2021-03-07 NOTE — Evaluation (Signed)
Physical Therapy Evaluation Patient Details Name: Dawn Woods MRN: [id number] DOB: [birth year] Today's Date: 03/07/2021   History of Present Illness  Pt is an 87 y.o. female admitted 6/9 after sustaining a L hip fx from a fall at her nursing facility. She underwent L hip hemiarthroplasty (anterior approach) 6/11. PMH: HTN, HLD, GERD, tobacco use, hypothyroidism, dementia, fall and right hip fracture s/p THA Feb 2022.   Clinical Impression  Pt admitted with above diagnosis. On eval, pt required +2 total assist for bed mobility, and min to mod assist to maintain sitting balance EOB. Pt lethargic during session with bouts of restlessness when able to rouse. Pt appropriately shaking her head yes when asked "are you in pain."  Pt currently with functional limitations due to the deficits listed below (see PT Problem List). Pt will benefit from skilled PT to increase their independence and safety with mobility to allow discharge to the venue listed below.       Follow Up Recommendations SNF    Equipment Recommendations  Other (comment) (defer to post acute)    Recommendations for Other Services       Precautions / Restrictions Precautions Precautions: Fall Restrictions Weight Bearing Restrictions: Yes LLE Weight Bearing: Weight bearing as tolerated      Mobility  Bed Mobility Overal bed mobility: Needs Assistance Bed Mobility: Supine to Sit;Sit to Supine;Rolling Rolling: Total assist;+2 for physical assistance   Supine to sit: Total assist;+2 for physical assistance Sit to supine: Total assist;+2 for physical assistance   General bed mobility comments: assist for all aspects of mobility, pt able to initiate assist with rolling by reaching for bedrail    Transfers Overall transfer level: Needs assistance               General transfer comment: unable to safely progress beyond EOB  Ambulation/Gait             General Gait Details: unable  Stairs             Wheelchair Mobility    Modified Rankin (Stroke Patients Only)       Balance Overall balance assessment: Needs assistance Sitting-balance support: Bilateral upper extremity supported;Feet supported Sitting balance-Leahy Scale: Poor Sitting balance - Comments: min to mod assist to maintain sitting balance EOB                                     Pertinent Vitals/Pain Pain Assessment: Faces Faces Pain Scale: Hurts whole lot Pain Location: L hip during mobility Pain Descriptors / Indicators: Grimacing;Operative site guarding Pain Intervention(s): Limited activity within patient's tolerance;Monitored during session;Ice applied;Repositioned    Home Living Family/patient expects to be discharged to:: Skilled nursing facility                 Additional Comments: Pt is a poor historian with advanced dementia and unable to provide PLOF; family anticipates d/c back to  [facility], SNF    Prior Function Level of Independence: Needs assistance         Comments: Per chart notes from March 2022, prior to fall with R hip fx: pt was using rollator to ambulate and recieved vc fror dressing, dependent A for ted hose, wore 02 and required assist for toileting. Pt PLOF from March-now is TBD     Hand Dominance   Dominant Hand: Right    Extremity/Trunk Assessment   Upper Extremity Assessment Upper Extremity Assessment: Defer  to OT evaluation    Lower Extremity Assessment Lower Extremity Assessment: Generalized weakness;LLE deficits/detail;RLE deficits/detail;Difficult to assess due to impaired cognition RLE Deficits / Details: h/o recent R THA due to hip fx (Feb 2022) LLE Deficits / Details: s/p L hip hemiarthroplasty LLE: Unable to fully assess due to pain    Cervical / Trunk Assessment Cervical / Trunk Assessment: Kyphotic  Communication   Communication: Other (comment);Expressive difficulties (Pt with severe dementia, responded appropriately to "are you in  pain," Oriented x0)  Cognition Arousal/Alertness: Awake/alert;Lethargic Behavior During Therapy: Restless Overall Cognitive Status: History of cognitive impairments - at baseline                                 General Comments: Pt alseep upon arrival, dosing in and out throughout session; difficult to rouse; pt with restless affect during bouts of alert windows      General Comments General comments (skin integrity, edema, etc.): VSS on 5L    Exercises     Assessment/Plan    PT Assessment Patient needs continued PT services  PT Problem List Decreased strength;Decreased mobility;Decreased safety awareness;Decreased knowledge of precautions;Decreased activity tolerance;Decreased cognition;Pain;Cardiopulmonary status limiting activity;Decreased balance       PT Treatment Interventions DME instruction;Therapeutic activities;Cognitive remediation;Gait training;Therapeutic exercise;Patient/family education;Balance training;Functional mobility training    PT Goals (Current goals can be found in the Care Plan section)  Acute Rehab PT Goals Patient Stated Goal: unable to state PT Goal Formulation: Patient unable to participate in goal setting Time For Goal Achievement: 03/21/21 Potential to Achieve Goals: Fair    Frequency Min 3X/week   Barriers to discharge        Co-evaluation PT/OT/SLP Co-Evaluation/Treatment: Yes Reason for Co-Treatment: Complexity of the patient's impairments (multi-system involvement);For patient/therapist safety;Necessary to address cognition/behavior during functional activity;To address functional/ADL transfers PT goals addressed during session: Mobility/safety with mobility;Balance OT goals addressed during session: ADL's and self-care;Strengthening/ROM (functional transfers and bed mobility)       AM-PAC PT "6 Clicks" Mobility  Outcome Measure Help needed turning from your back to your side while in a flat bed without using bedrails?:  Total Help needed moving from lying on your back to sitting on the side of a flat bed without using bedrails?: Total Help needed moving to and from a bed to a chair (including a wheelchair)?: Total Help needed standing up from a chair using your arms (e.g., wheelchair or bedside chair)?: Total Help needed to walk in hospital room?: Total Help needed climbing 3-5 steps with a railing? : Total 6 Click Score: 6    End of Session Equipment Utilized During Treatment: Oxygen Activity Tolerance: Patient limited by pain;Patient limited by lethargy Patient left: in bed;with call bell/phone within reach;with bed alarm set;with SCD's reapplied Nurse Communication: Mobility status PT Visit Diagnosis: Other abnormalities of gait and mobility (R26.89);Pain;Muscle weakness (generalized) (M62.81);Repeated falls (R29.6) Pain - Right/Left: Left Pain - part of body: Hip    Time: 0913-0930 PT Time Calculation (min) (ACUTE ONLY): 17 min   Charges:   PT Evaluation $PT Eval Moderate Complexity: 1 Mod          Lorrin Goodell, PT  Office # [phone] Pager [phone]   Lorriane Shire 03/07/2021, 10:58 AM

## 2021-03-07 NOTE — Plan of Care (Signed)

## 2021-03-07 NOTE — Progress Notes (Signed)
Occupational Therapy Evaluation Patient Details Name: Dawn Woods MRN: [id number] DOB: 04-Oct-1929 Today's Date: 03/07/2021    History of Present Illness Pt is an 87 y.o. female admitted 6/9 after sustaining a L hip fx from a fall at her nursing facility. She underwent L hip hemiarthroplasty (anterior approach) 6/11. PMH: HTN, HLD, GERD, tobacco use, hypothyroidism, dementia, fall and right hip fracture s/p THA Feb 2022.   Clinical Impression   Dawn Woods was evaluated s/p the above L hip hemiarthroplasty. PTA pt was living at [facility]; PLOF should be confirmed with facility, pt is poor historian due to advanced dementia. Upon arrival pt was asleep and difficult to rouse, and continued to be lethargic throughout session. During brief periods of alertness, pt was restless and reported pain with movement. Pt was total A +2 for all bed mobility. Pt would benefit from continued OT acutely to maximize mobility and function in ADLs. Recommend d/c back to SNF.     Follow Up Recommendations  SNF;Supervision/Assistance - 24 hour    Equipment Recommendations  None recommended by OT       Precautions / Restrictions Precautions Precautions: Fall Restrictions Weight Bearing Restrictions: Yes LLE Weight Bearing: Weight bearing as tolerated      Mobility Bed Mobility Overal bed mobility: Needs Assistance Bed Mobility: Supine to Sit;Sit to Supine;Rolling Rolling: Total assist;+2 for physical assistance   Supine to sit: Total assist;+2 for physical assistance Sit to supine: Total assist;+2 for physical assistance   General bed mobility comments: +2 necessary for all bed mobility; pt able to assist in rolling by reaching unilateral UE across body for rail    Transfers Overall transfer level: Needs assistance               General transfer comment: deferred OOB this session due to safety    Balance Overall balance assessment: Needs assistance Sitting-balance support: Bilateral upper  extremity supported Sitting balance-Leahy Scale: Poor               ADL either performed or assessed with clinical judgement   ADL Overall ADL's : Needs assistance/impaired Eating/Feeding: Total assistance;Bed level   Grooming: Total assistance;Bed level   Upper Body Bathing: Total assistance;Bed level   Lower Body Bathing: Total assistance;Bed level;+2 for physical assistance;+2 for safety/equipment   Upper Body Dressing : Maximal assistance;Sitting Upper Body Dressing Details (indicate cue type and reason): pt able to assist by lifting arms Lower Body Dressing: Total assistance;Bed level;+2 for physical assistance;+2 for safety/equipment   Toilet Transfer: Total assistance;+2 for physical assistance;+2 for safety/equipment   Toileting- Clothing Manipulation and Hygiene: Total assistance;Bed level;+2 for physical assistance;+2 for safety/equipment       Functional mobility during ADLs: Total assistance (bed level this session) General ADL Comments: overall total A for ADLs at bed level due to severe dementia and pain with mvoement of L hip     Vision   Vision Assessment?: Vision impaired- to be further tested in functional context Additional Comments: Pt eyes closed the majority of the session            Pertinent Vitals/Pain Pain Assessment: Faces Faces Pain Scale: Hurts whole lot Pain Location: L hip Pain Descriptors / Indicators: Grimacing;Operative site guarding Pain Intervention(s): Limited activity within patient's tolerance;Monitored during session     Hand Dominance Right   Extremity/Trunk Assessment Upper Extremity Assessment Upper Extremity Assessment: Generalized weakness;Difficult to assess due to impaired cognition   Lower Extremity Assessment Lower Extremity Assessment: Defer to PT evaluation   Cervical /  Trunk Assessment Cervical / Trunk Assessment: Kyphotic   Communication Communication Communication: Other (comment);Expressive  difficulties (Pt with severe dementia, responded appropriately to "are you in pain," Oriented x0)   Cognition Arousal/Alertness: Awake/alert;Lethargic Behavior During Therapy: Restless Overall Cognitive Status: History of cognitive impairments - at baseline                 General Comments: Pt alseep upon arrival, dosing in and out throughout session; difficult to rouse; pt with restless affect during bouts of alert windows   General Comments  pt asleep upon arousal, restless when alert, did not resond to verbal or tactile cues, no direction following; intermittently responseded appropriately to "are you in pain."     Home Living Family/patient expects to be discharged to:: Skilled nursing facility               Additional Comments: Pt is a poor historian with advanced dementia and unable to provide PLOF; family anticipates d/c back to  [facility], SNF      Prior Functioning/Environment Level of Independence: Needs assistance        Comments: Per chart notes from March 2022, prior to fall with R hip fx: pt was using rollator to ambulate and recieved vc fror dressing, dependent A for ted hose, wore 02 and required assist for toileting. Pt PLOF from [facility] is TBD        OT Problem List: Decreased strength;Decreased range of motion;Decreased activity tolerance;Decreased safety awareness;Decreased knowledge of use of DME or AE;Decreased knowledge of precautions;Decreased cognition;Pain      OT Treatment/Interventions: Self-care/ADL training;Therapeutic activities;Balance training;Patient/family education;Cognitive remediation/compensation    OT Goals(Current goals can be found in the care plan section) Acute Rehab OT Goals Patient Stated Goal: unable to state OT Goal Formulation: Patient unable to participate in goal setting Time For Goal Achievement: 03/21/21 Potential to Achieve Goals: Fair ADL Goals Pt Will Perform Eating: with min assist;sitting Pt Will Perform  Grooming: with min assist;sitting Pt Will Transfer to Toilet: with min assist;with +2 assist;squat pivot transfer;bedside commode Pt Will Perform Toileting - Clothing Manipulation and hygiene: with min assist;sitting/lateral leans  OT Frequency: Min 2X/week       Co-evaluation PT/OT/SLP Co-Evaluation/Treatment: Yes Reason for Co-Treatment: Complexity of the patient's impairments (multi-system involvement);Necessary to address cognition/behavior during functional activity;For patient/therapist safety;To address functional/ADL transfers   OT goals addressed during session: ADL's and self-care;Strengthening/ROM (functional transfers and bed mobility)      AM-PAC OT "6 Clicks" Daily Activity     Outcome Measure Help from another person eating meals?: Total Help from another person taking care of personal grooming?: Total Help from another person toileting, which includes using toliet, bedpan, or urinal?: Total Help from another person bathing (including washing, rinsing, drying)?: Total Help from another person to put on and taking off regular upper body clothing?: A Lot Help from another person to put on and taking off regular lower body clothing?: Total 6 Click Score: 7   End of Session Nurse Communication: Mobility status;Precautions  Activity Tolerance: Patient limited by lethargy;Patient limited by pain Patient left: in bed;with bed alarm set  OT Visit Diagnosis: Other abnormalities of gait and mobility (R26.89);Repeated falls (R29.6);Muscle weakness (generalized) (M62.81);History of falling (Z91.81);Pain Pain - Right/Left: Left Pain - part of body: Hip                Time: 0913-0930 OT Time Calculation (min): 17 min Charges:  OT General Charges $OT Visit: 1 Visit OT Evaluation $OT Eval Moderate Complexity: 1  Mod   Yevonne Yokum A Aaminah Forrester 03/07/2021, 10:13 AM

## 2021-03-07 NOTE — Progress Notes (Addendum)
PROGRESS NOTE  [name]  XIP:[id number] DOB: Jan 30, 1930 DOA: 03/04/2021 PCP: Marletta Lor, MD (Inactive)   Brief Narrative: Dawn Woods is an 87 y.o. female with a history of HTN, HLD, GERD, tobacco use, hypothyroidism, dementia, fall and right hip fracture s/p THA Feb 2022 who presented from her nursing facility 6/9 having been diagnosed with left hip fracture after unwitnessed fall at [facility]. VSS in ED. Hip fracture confirmed at XR, CT head and cervical spine without acute fracture or dislocation. Labs remarkable only for WBC 12.4k which normalized without antimicrobials. Orthopedics performed left anterior hemiarthroplasty 03/06/2021.  Assessment & Plan: Active Problems:   Hypothyroidism   GERD without esophagitis   Dementia with behavioral disturbance (HCC)   Depression   Essential hypertension   COPD (chronic obstructive pulmonary disease) (HCC)   Chronic respiratory failure with hypoxia (HCC)   Closed left hip fracture, initial encounter (HCC)   Protein-calorie malnutrition, severe  Fall with traumatic closed left femoral neck fracture: - Dr. Lyla Glassing (who performed right anterior hemiarthroplasty on 11/23/2020) performed left hip anterior hemiarthroplasty on 03/06/2021 under spinal anesthesia. Follow up XR shows no complications. - Continue vitamin D supplementation (50k qThursday) - PT/OT following surgery > plan return to SNF. - Pain control per orthopedics, controlled with hydrocodone. Note allergy to ibuprofen.   Dementia with depression, insomnia: - Continue home medications including escitalopram, depakote 125mg  BID, prn ativan, melatonin qHS prn - Delirium precautions   Chronic hypoxic respiratory failure, COPD:  - Remain on baseline 3L O2. CXR without infiltrate. Will need encouragement for IS frequently.  - Continue scheduled and prn bronchodilators   HTN: - Continue norvasc   Hypothyroidism: - Continue synthroid 151mcg   Moderate protein  calorie malnutrition: - Continue ensure (taking at NH)   GERD: - Continue PPI  Hyponatremia:  - Change to isotonic saline, increase rate slightly. and Monitor in AM.   Acute blood loss anemia: Hgb down as anticipated 13.7 > 11.9 > 11 - Monitor in AM. If stable, could be discharged back to SNF.  DVT prophylaxis: Will start lovenox Code Status: DNR Family Communication: HCPOA by phone 6/9, 6/10, 6/12.   Disposition Plan:  Status is: Inpatient  Remains inpatient appropriate because:Ongoing diagnostic testing needed not appropriate for outpatient work up to confirm stability of anemia and bed availability for SNF.  Dispo: The patient is from: SNF              Anticipated d/c is to: SNF 6/13              Patient currently is not medically stable to d/c.   Difficult to place patient No  Consultants:  Orthopedics  Procedures:  Left hip hemiarthroplasty, anterior approach by Dr. Lyla Glassing 03/06/2021  Antimicrobials: None   Subjective: Drowsy but rousable, worked with PT and OT today. Pain is fairly controlled, but she does state she in pain, has not gotten prn yet today.   Objective: Vitals:   03/06/21 1943 03/07/21 0028 03/07/21 0919 03/07/21 1008  BP: 121/73 (!) 104/51  132/78  Pulse: 79 72  77  Resp: 15 16  18   Temp: 98.3 F (36.8 C) 97.8 F (36.6 C)  97.8 F (36.6 C)  TempSrc:  Axillary  Axillary  SpO2: 90% 99% 95% 94%    Intake/Output Summary (Last 24 hours) at 03/07/2021 1122 Last data filed at 03/06/2021 2333 Gross per 24 hour  Intake 535 ml  Output 550 ml  Net -15 ml   Gen: Frail elderly female in  no distress Pulm: Nonlabored breathing 3L O2, clear. CV: Regular rate and rhythm. No murmur, rub, or gallop. No JVD, no dependent edema. GI: Abdomen soft, non-tender, non-distended, with normoactive bowel sounds.  Ext: Warm, anterior left hip w/c/d/I dressing without grimace elicited to palpation.  Skin: No other rashes, lesions or ulcers on visualized  skin. Neuro: Confused, moves all extremities. Psych: Judgement and insight appear impaired. Calm.   Data Reviewed: I have personally reviewed following labs and imaging studies  CBC: Recent Labs  Lab 03/04/21 1303 03/05/21 0435 03/07/21 0336  WBC 12.4* 9.4 12.4*  NEUTROABS 10.5*  --   --   HGB 13.7 11.9* 11.0*  HCT 43.1 37.8 35.4*  MCV 94.5 93.8 94.7  PLT 252 191 742   Basic Metabolic Panel: Recent Labs  Lab 03/04/21 1303 03/05/21 0435 03/07/21 0336  NA 139 138 134*  K 3.8 4.1 4.9  CL 101 101 98  CO2 27 31 30   GLUCOSE 137* 133* 135*  BUN 18 18 23   CREATININE 0.74 0.75 0.78  CALCIUM 8.9 8.5* 8.3*   Recent Results (from the past 240 hour(s))  Resp Panel by RT-PCR (Flu A&B, Covid) Nasopharyngeal Swab     Status: None   Collection Time: 03/04/21  1:05 PM   Specimen: Nasopharyngeal Swab; Nasopharyngeal(NP) swabs in vial transport medium  Result Value Ref Range Status   SARS Coronavirus 2 by RT PCR NEGATIVE NEGATIVE Final   Influenza A by PCR NEGATIVE NEGATIVE Final   Influenza B by PCR NEGATIVE NEGATIVE Final  Surgical pcr screen     Status: None   Collection Time: 03/04/21  5:41 PM   Specimen: Nasal Mucosa; Nasal Swab  Result Value Ref Range Status   MRSA, PCR NEGATIVE NEGATIVE Final   Staphylococcus aureus NEGATIVE NEGATIVE Final   Radiology Studies: Pelvis Portable  Result Date: 03/06/2021 CLINICAL DATA:  Left femoral neck fracture. EXAM: OPERATIVE left HIP (WITH PELVIS IF PERFORMED) 4 VIEWS TECHNIQUE: Fluoroscopic spot image(s) were submitted for interpretation post-operatively. COMPARISON:  Radiographs 03/04/2021 FINDINGS: Fluoroscopic spot images demonstrate placement of a bipolar left hip prosthesis. The femoral component is well seated. No complicating features. IMPRESSION: Bipolar left hip prosthesis and good position without complicating features. Electronically Signed   By: Marijo Sanes M.D.   On: 03/06/2021 11:58   DG C-Arm 1-60 Min  Result Date:  03/06/2021 CLINICAL DATA:  Left femoral neck fracture. EXAM: OPERATIVE left HIP (WITH PELVIS IF PERFORMED) 4 VIEWS TECHNIQUE: Fluoroscopic spot image(s) were submitted for interpretation post-operatively. COMPARISON:  Radiographs 03/04/2021 FINDINGS: Fluoroscopic spot images demonstrate placement of a bipolar left hip prosthesis. The femoral component is well seated. No complicating features. IMPRESSION: Bipolar left hip prosthesis and good position without complicating features. Electronically Signed   By: Marijo Sanes M.D.   On: 03/06/2021 11:58   DG HIP OPERATIVE UNILAT WITH PELVIS LEFT  Result Date: 03/06/2021 CLINICAL DATA:  Left femoral neck fracture. EXAM: OPERATIVE left HIP (WITH PELVIS IF PERFORMED) 4 VIEWS TECHNIQUE: Fluoroscopic spot image(s) were submitted for interpretation post-operatively. COMPARISON:  Radiographs 03/04/2021 FINDINGS: Fluoroscopic spot images demonstrate placement of a bipolar left hip prosthesis. The femoral component is well seated. No complicating features. IMPRESSION: Bipolar left hip prosthesis and good position without complicating features. Electronically Signed   By: Marijo Sanes M.D.   On: 03/06/2021 11:58    Scheduled Meds:  acetaminophen  500 mg Oral Q6H   amLODipine  10 mg Oral q morning   divalproex  125 mg Oral BID  docusate sodium  100 mg Oral BID   enoxaparin (LOVENOX) injection  40 mg Subcutaneous Q24H   escitalopram  15 mg Oral q morning   feeding supplement  237 mL Oral TID BM   levothyroxine  112 mcg Oral QAC breakfast   mometasone-formoterol  2 puff Inhalation BID   multivitamin with minerals  1 tablet Oral Daily   pantoprazole  40 mg Oral q morning   senna  1 tablet Oral BID   [START ON 03/11/2021] Vitamin D (Ergocalciferol)  50,000 Units Oral Q Thu   Continuous Infusions:  dextrose 5 % and 0.45% NaCl 75 mL/hr at 03/05/21 1236   methocarbamol (ROBAXIN) IV       LOS: 3 days   Time spent: 25 minutes.  Patrecia Pour, MD [facility] [email or web address] 03/07/2021, 11:22 AM

## 2021-03-08 ENCOUNTER — Encounter (HOSPITAL_COMMUNITY): Payer: Self-pay | Admitting: Orthopedic Surgery

## 2021-03-08 ENCOUNTER — Inpatient Hospital Stay (HOSPITAL_COMMUNITY): Payer: Medicare Other

## 2021-03-08 DIAGNOSIS — E43 Unspecified severe protein-calorie malnutrition: Secondary | ICD-10-CM

## 2021-03-08 DIAGNOSIS — J9601 Acute respiratory failure with hypoxia: Secondary | ICD-10-CM

## 2021-03-08 MED ORDER — ENOXAPARIN SODIUM 40 MG/0.4ML IJ SOSY: 40.0000 mg | PREFILLED_SYRINGE | INTRAMUSCULAR | 0 refills | Status: AC

## 2021-03-08 MED ORDER — FUROSEMIDE 10 MG/ML IJ SOLN
20.0000 mg | Freq: Once | INTRAMUSCULAR | Status: AC
  Administered 2021-03-08: 20 mg via INTRAVENOUS
  Filled 2021-03-08: qty 2

## 2021-03-08 MED ORDER — HYDROCODONE-ACETAMINOPHEN 5-325 MG PO TABS: 1.0000 | ORAL_TABLET | ORAL | 0 refills | Status: AC | PRN

## 2021-03-08 NOTE — Progress Notes (Signed)
PROGRESS NOTE  Dawn Woods  ZOX:[id number] DOB: 1930/03/31 DOA: 03/04/2021 PCP: Marletta Lor, MD (Inactive)   Brief Narrative: Dawn Woods is an 87 y.o. female with a history of HTN, HLD, GERD, tobacco use, hypothyroidism, dementia, fall and right hip fracture s/p THA Feb 2022 who presented from her nursing facility 6/9 having been diagnosed with left hip fracture after unwitnessed fall at [facility]. VSS in ED. Hip fracture confirmed at XR, CT head and cervical spine without acute fracture or dislocation. Labs remarkable only for WBC 12.4k which normalized without antimicrobials. Orthopedics performed left anterior hemiarthroplasty 03/06/2021. Pt became slightly more hypoxic postoperatively for which IV fluids are discontinued and lasix is given.   Assessment & Plan: Active Problems:   Hypothyroidism   GERD without esophagitis   Dementia with behavioral disturbance (HCC)   Depression   Essential hypertension   COPD (chronic obstructive pulmonary disease) (HCC)   Chronic respiratory failure with hypoxia (HCC)   Closed left hip fracture, initial encounter (HCC)   Protein-calorie malnutrition, severe  Fall with traumatic closed left femoral neck fracture: - Dr. Lyla Glassing (who performed right anterior hemiarthroplasty on 11/23/2020) performed left hip anterior hemiarthroplasty on 03/06/2021 under spinal anesthesia. Follow up XR shows no complications. - Continue vitamin D supplementation (50k qThursday) - PT/OT following surgery > plan return to SNF. - Pain control per orthopedics, controlled with hydrocodone. Note allergy to ibuprofen.   Dementia with depression, insomnia: - Continue home medications including escitalopram, depakote 125mg  BID, prn ativan, melatonin qHS prn - Delirium precautions   Acute on chronic hypoxic respiratory failure, COPD:  - CXR repeated 6/13 with 5L O2 dependence. No respiratory distress. Personally interpreted revealing increased insterstitial  opacities diffusely consistent with pulmonary edema with some bibasilar atelectasis. Will stop IVF, give lasix 20mg  IV x1, monitor I/O, BMP in AM.  - Needs frequent encouragement to use incentive spirometry.  - Continue scheduled and prn bronchodilators   HTN: - Continue norvasc   Hypothyroidism: - Continue synthroid 148mcg   Moderate protein calorie malnutrition: - Continue ensure (taking at NH)   GERD: - Continue PPI  Hyponatremia:  - Change to isotonic saline, increase rate slightly. and Monitor in AM.   Acute blood loss anemia: Hgb down as anticipated 13.7 > 11.9 > 11 - Monitor in AM. If stable, could be discharged back to SNF.  DVT prophylaxis: Lovenox Code Status: DNR Family Communication: HCPOA by phone  Disposition Plan:  Status is: Inpatient  Remains inpatient appropriate because:Ongoing diagnostic testing needed not appropriate for outpatient work up to confirm stability of anemia and bed availability for SNF.  Dispo: The patient is from: SNF              Anticipated d/c is to: SNF 6/14              Patient currently is not medically stable to d/c.   Difficult to place patient No  Consultants:  Orthopedics  Procedures:  Left hip hemiarthroplasty, anterior approach by Dr. Lyla Glassing 03/06/2021  Antimicrobials: None   Subjective: Denies pain, she is interactive but not speaking. Still with delirium behaviors. Denies shortness of breath but was increased to 5L O2 yesterday.   Objective: Vitals:   03/08/21 0512 03/08/21 0731 03/08/21 0759 03/08/21 1000  BP: 124/65 138/71    Pulse: 72 74    Resp: 16 17    Temp: 98.4 F (36.9 C) 97.9 F (36.6 C)    TempSrc: Oral Oral    SpO2: 94% 93% 94% 90%  Intake/Output Summary (Last 24 hours) at 03/08/2021 1300 Last data filed at 03/08/2021 1100 Gross per 24 hour  Intake 2049.01 ml  Output --  Net 2049.01 ml   Gen: Elderly, frail female in no distress Pulm: Nonlabored breathing, tachypneic on 5L O2. Crackles  laterally. CV: Regular rate and rhythm. No murmur, rub, or gallop. No JVD, no significant dependent edema. GI: Abdomen soft, non-tender, non-distended, with normoactive bowel sounds.  Ext: Warm, no deformities Skin: No rashes, lesions or ulcers on visualized skin. Anterior thigh wound q/dressing remains c/d/I.  Neuro: Alert, disoriented, moves all extremities but not following commands consistently. Psych: Judgement and insight appear impaired. Currently calm.   Data Reviewed: I have personally reviewed following labs and imaging studies  CBC: Recent Labs  Lab 03/04/21 1303 03/05/21 0435 03/07/21 0336  WBC 12.4* 9.4 12.4*  NEUTROABS 10.5*  --   --   HGB 13.7 11.9* 11.0*  HCT 43.1 37.8 35.4*  MCV 94.5 93.8 94.7  PLT 252 191 419   Basic Metabolic Panel: Recent Labs  Lab 03/04/21 1303 03/05/21 0435 03/07/21 0336  NA 139 138 134*  K 3.8 4.1 4.9  CL 101 101 98  CO2 27 31 30   GLUCOSE 137* 133* 135*  BUN 18 18 23   CREATININE 0.74 0.75 0.78  CALCIUM 8.9 8.5* 8.3*   Recent Results (from the past 240 hour(s))  Resp Panel by RT-PCR (Flu A&B, Covid) Nasopharyngeal Swab     Status: None   Collection Time: 03/04/21  1:05 PM   Specimen: Nasopharyngeal Swab; Nasopharyngeal(NP) swabs in vial transport medium  Result Value Ref Range Status   SARS Coronavirus 2 by RT PCR NEGATIVE NEGATIVE Final   Influenza A by PCR NEGATIVE NEGATIVE Final   Influenza B by PCR NEGATIVE NEGATIVE Final  Surgical pcr screen     Status: None   Collection Time: 03/04/21  5:41 PM   Specimen: Nasal Mucosa; Nasal Swab  Result Value Ref Range Status   MRSA, PCR NEGATIVE NEGATIVE Final   Staphylococcus aureus NEGATIVE NEGATIVE Final   Radiology Studies: DG CHEST PORT 1 VIEW  Result Date: 03/08/2021 CLINICAL DATA:  Acute respiratory failure with hypoxemia EXAM: PORTABLE CHEST 1 VIEW COMPARISON:  Portable exam 0832 hours compared to 03/04/2021 FINDINGS: Normal heart size and mediastinal contours. Pulmonary  vascular congestion. Atherosclerotic calcification aorta. Interstitial infiltrates in both lungs increased since previous exam at the lower lungs, perhaps slightly improved in RIGHT upper lobe. These changes could reflect increased pulmonary edema or atypical infection. Component of underlying chronic interstitial lung disease is suspected. LEFT pleural effusion and bibasilar atelectasis noted. Bones demineralized. IMPRESSION: Increased interstitial infiltrates in both lungs favor pulmonary edema or less likely atypical infection. Small LEFT pleural effusion and bibasilar atelectasis. Aortic Atherosclerosis (ICD10-I70.0). Electronically Signed   By: Lavonia Dana M.D.   On: 03/08/2021 08:47    Scheduled Meds:  amLODipine  10 mg Oral q morning   divalproex  125 mg Oral BID   docusate sodium  100 mg Oral BID   enoxaparin (LOVENOX) injection  40 mg Subcutaneous Q24H   escitalopram  15 mg Oral q morning   feeding supplement  237 mL Oral TID BM   levothyroxine  112 mcg Oral QAC breakfast   mometasone-formoterol  2 puff Inhalation BID   multivitamin with minerals  1 tablet Oral Daily   pantoprazole  40 mg Oral q morning   senna  1 tablet Oral BID   [START ON 03/11/2021] Vitamin D (Ergocalciferol)  50,000  Units Oral Q Thu   Continuous Infusions:  methocarbamol (ROBAXIN) IV       LOS: 4 days   Time spent: 35 minutes.  Patrecia Pour, MD [facility] [email or web address] 03/08/2021, 1:00 PM

## 2021-03-08 NOTE — Care Management Important Message (Signed)
Important Message  Patient Details  Name: Dawn Woods MRN: [id number] Date of Birth: 1929-11-30   Medicare Important Message Given:  Yes     Barb Merino [place] 03/08/2021, 2:37 PM

## 2021-03-08 NOTE — Plan of Care (Signed)
  Problem: Pain Managment: Goal: General experience of comfort will improve Outcome: Progressing   Problem: Safety: Goal: Ability to remain free from injury will improve Outcome: Progressing   Problem: Skin Integrity: Goal: Risk for impaired skin integrity will decrease Outcome: Progressing   

## 2021-03-09 LAB — CBC
HCT: 33.4 % — ABNORMAL LOW (ref 36.0–46.0)
Hemoglobin: 10.5 g/dL — ABNORMAL LOW (ref 12.0–15.0)
MCH: 29.6 pg (ref 26.0–34.0)
MCHC: 31.4 g/dL (ref 30.0–36.0)
MCV: 94.1 fL (ref 80.0–100.0)
Platelets: 242 10*3/uL (ref 150–400)
RBC: 3.55 MIL/uL — ABNORMAL LOW (ref 3.87–5.11)
RDW: 13.8 % (ref 11.5–15.5)
WBC: 9.4 10*3/uL (ref 4.0–10.5)
nRBC: 0 % (ref 0.0–0.2)

## 2021-03-09 LAB — BASIC METABOLIC PANEL
Anion gap: 8 (ref 5–15)
BUN: 20 mg/dL (ref 8–23)
CO2: 31 mmol/L (ref 22–32)
Calcium: 8.3 mg/dL — ABNORMAL LOW (ref 8.9–10.3)
Chloride: 97 mmol/L — ABNORMAL LOW (ref 98–111)
Creatinine, Ser: 0.59 mg/dL (ref 0.44–1.00)
GFR, Estimated: >60 mL/min (ref >60)
Glucose, Bld: 104 mg/dL — ABNORMAL HIGH (ref 70–99)
Potassium: 4.9 mmol/L (ref 3.5–5.1)
Sodium: 136 mmol/L (ref 135–145)

## 2021-03-09 MED ORDER — TRIAMCINOLONE ACETONIDE 55 MCG/ACT NA AERO: 1.0000 | INHALATION_SPRAY | Freq: Every day | NASAL | Status: AC

## 2021-03-09 NOTE — Plan of Care (Signed)

## 2021-03-09 NOTE — TOC Transition Note (Signed)
Transition of Care [facility]) - CM/SW Discharge Note   Patient Details  Name: Alicianna Litchford MRN: [id number] Date of Birth: 1929/11/22  [facility]) CM/SW Contact:  Milinda Antis, [place] Phone Number: 03/09/2021, 12:45 PM   Clinical Narrative:    Patient will DC to: SNF Anticipated DC date: 03/09/2021 Family notified: Yes   Transport by: Corey Harold   Per MD patient ready for DC to [facility]. RN to call report prior to discharge [phone]. RN, patient, patient's family, and facility notified of DC. Discharge Summary and FL2 sent to facility. DC packet on chart. Ambulance transport requested for patient.   CSW will sign off for now as social work intervention is no longer needed. Please consult [place] again if new needs arise.     Final next level of care: Skilled Nursing Facility Barriers to Discharge: Barriers Resolved   Patient Goals and CMS Choice   CMS Medicare.gov Compare Post Acute Care list provided to:: Patient Represenative (must comment) Choice offered to / list presented to : [place] / [place]  Discharge Placement              Patient chooses bed at: [facility] Patient to be transferred to facility by: [place] Name of family member notified: Unika, Nazareno (Son)   [phone] ( Patient and family notified of of transfer: 03/09/21  Discharge Plan and Services                                     Social Determinants of Health (SDOH) Interventions     Readmission Risk Interventions No flowsheet data found.

## 2021-03-09 NOTE — Progress Notes (Signed)
PT Cancellation Note  Patient Details Name: Dawn Woods MRN: [id number] DOB: 02/06/1930   Cancelled Treatment:    Reason Eval/Treat Not Completed: Other (comment). Pt to dc to SNF shortly. Waiting on transport. Pt declined activity at this time.    Shary Decamp [facility] 03/09/2021, 4:03 PM

## 2021-03-09 NOTE — Discharge Summary (Signed)
Physician Discharge Summary  Dawn Woods MBW:[id number] DOB: [birth year] DOA: 03/04/2021  PCP: Marletta Lor, MD (Inactive)  Admit date: 03/04/2021 Discharge date: 03/09/2021  Admitted From: [facility] Disposition: [facility]   Recommendations for Outpatient Follow-up:  Follow up with orthopedics, Dr. Lyla Glassing, in 2 weeks  Home Health: NA Equipment/Devices: Continue 3L O2 Discharge Condition: Stable CODE STATUS: DNR Diet recommendation: As tolerated  Brief/Interim Summary: Dawn Woods is an 87 y.o. female with a history of HTN, HLD, GERD, tobacco use, hypothyroidism, dementia, fall and right hip fracture s/p THA Feb 2022 who presented from her nursing facility 6/9 having been diagnosed with left hip fracture after unwitnessed fall at [facility]. VSS in ED. Hip fracture confirmed at XR, CT head and cervical spine without acute fracture or dislocation. Labs remarkable only for WBC 12.4k which normalized without antimicrobials. Orthopedics performed left anterior hemiarthroplasty 03/06/2021. Pt became slightly more hypoxic postoperatively which resolved with discontinuation of IV fluids and a single dose of lasix.   Discharge Diagnoses:  Active Problems:   Hypothyroidism   GERD without esophagitis   Dementia with behavioral disturbance (HCC)   Depression   Essential hypertension   COPD (chronic obstructive pulmonary disease) (HCC)   Chronic respiratory failure with hypoxia (HCC)   Closed left hip fracture, initial encounter (HCC)   Protein-calorie malnutrition, severe  Fall with traumatic closed left femoral neck fracture: - Dr. Lyla Glassing (who performed right anterior hemiarthroplasty on 11/23/2020) performed left hip anterior hemiarthroplasty on 03/06/2021 under spinal anesthesia. Follow up XR shows no complications. - Continue vitamin D supplementation (50k qThursday) - PT/OT following surgery > plan return to SNF. - Pain control per orthopedics, controlled with hydrocodone.  Note allergy to ibuprofen. - Continue lovenox 40mg  q24h for 30 days per orthopedics   Dementia with depression, insomnia: - Continue home medications including escitalopram, depakote 125mg  BID, prn ativan, melatonin qHS prn   Acute on chronic hypoxic respiratory failure, COPD: Transiently required 5L O2, though is now back down to 3L with SpO2 in mid-90%'s and normal respiratory effort after a single dose of lasix.  - Needs frequent encouragement to use incentive spirometry.  - Continue scheduled and prn bronchodilators   HTN: - Continue norvasc   Hypothyroidism: - Continue synthroid 168mcg   Severe protein calorie malnutrition: - Continue ensure    GERD: - Continue PPI   Hyponatremia: Resolved.   Acute blood loss anemia: Hgb down as anticipated without bleeding noted. Hgb is 10.5 at discharge. Recommend recheck after discharge to confirm stability.   Discharge Instructions  Allergies as of 03/09/2021       Reactions   Aspirin Other (See Comments)   GI Bleed   Ibuprofen Other (See Comments)   GI Bleed   Ciprofloxacin Hives        Medication List     STOP taking these medications    enoxaparin 40 MG/0.4ML injection Commonly known as: LOVENOX Replaced by: enoxaparin 40 MG/0.4ML injection   guaiFENesin 100 MG/5ML Soln Commonly known as: ROBITUSSIN   LORazepam 0.5 MG tablet Commonly known as: ATIVAN   Mucinex Maximum Strength 1200 MG Tb12 Generic drug: Guaifenesin       TAKE these medications    acetaminophen 325 MG tablet Commonly known as: TYLENOL Take 325-650 mg by mouth every 6 (six) hours as needed (for left hip pain- NOT TO EXCEED 4,000 MG/DAY).   albuterol 108 (90 Base) MCG/ACT inhaler Commonly known as: VENTOLIN HFA Inhale 2 puffs into the lungs every 6 (six) hours as needed for wheezing  or shortness of breath.   amLODipine 10 MG tablet Commonly known as: NORVASC Take 10 mg by mouth every morning.   barrier cream Crea Commonly known as:  non-specified Apply 1 application topically See admin instructions. Apply as needed for skin protection   bisacodyl 5 MG EC tablet Commonly known as: DULCOLAX Take 10 mg by mouth daily as needed for moderate constipation or mild constipation (for 72 hours- may remove hard stool "digitally," if needed).   budesonide-formoterol 160-4.5 MCG/ACT inhaler Commonly known as: SYMBICORT Inhale 2 puffs into the lungs 2 (two) times daily.   divalproex 125 MG capsule Commonly known as: DEPAKOTE SPRINKLE Take 125 mg by mouth 2 (two) times daily.   enoxaparin 40 MG/0.4ML injection Commonly known as: LOVENOX Inject 0.4 mLs (40 mg total) into the skin daily. Replaces: enoxaparin 40 MG/0.4ML injection   escitalopram 10 MG tablet Commonly known as: LEXAPRO Take 15 mg by mouth every morning.   feeding supplement Liqd Take 237 mLs by mouth 2 (two) times daily between meals.   HYDROcodone-acetaminophen 5-325 MG tablet Commonly known as: NORCO/VICODIN Take 1 tablet by mouth every 4 (four) hours as needed for up to 7 days (for left hip pain- CANNOT EXCEED 4,000 MG/24 HOURS FROM ALL SOURCES).   levothyroxine 112 MCG tablet Commonly known as: SYNTHROID Take 112 mcg by mouth every morning.   loratadine 10 MG tablet Commonly known as: CLARITIN Take 10 mg by mouth every morning.   melatonin 3 MG Tabs tablet Take 3 mg by mouth at bedtime as needed (insomnia).   OXYGEN Inhale 3 L/min into the lungs See admin instructions. 3 L/min to maintain sats at 88-92%   pantoprazole 40 MG tablet Commonly known as: PROTONIX Take 1 tablet (40 mg total) by mouth every morning.   polyethylene glycol 17 g packet Commonly known as: MIRALAX / GLYCOLAX Take 17 g by mouth daily as needed for mild constipation.   PREVIDENT 5000 PLUS DT Place 1 application onto teeth See admin instructions. Brush topically on teeth with a toothbrush after evening mouth care   triamcinolone 55 MCG/ACT Aero nasal inhaler Commonly  known as: NASACORT Place 1 spray into the nose at bedtime.   Vitamin D (Ergocalciferol) 1.25 MG (50000 UNIT) Caps capsule Commonly known as: DRISDOL Take 50,000 Units by mouth every Thursday.        Follow-up Information     Swinteck, Aaron Edelman, MD. Schedule an appointment as soon as possible for a visit in 2 week(s).   Specialty: Orthopedic Surgery Why: For wound re-check Contact information: [address] [address] [phone]         Marletta Lor, MD Follow up.   Specialty: Internal Medicine               Allergies  Allergen Reactions   Aspirin Other (See Comments)    GI Bleed   Ibuprofen Other (See Comments)    GI Bleed   Ciprofloxacin Hives    Consultations: Orthopedics  Procedures/Studies: CT Head Wo Contrast  Result Date: 03/04/2021 CLINICAL DATA:  Injury. EXAM: CT HEAD WITHOUT CONTRAST CT CERVICAL SPINE WITHOUT CONTRAST TECHNIQUE: Multidetector CT imaging of the head and cervical spine was performed following the standard protocol without intravenous contrast. Multiplanar CT image reconstructions of the cervical spine were also generated. COMPARISON:  Head and cervical spine CT scans 11/21/2020. FINDINGS: CT HEAD FINDINGS Brain: No evidence of acute infarction, hemorrhage, hydrocephalus, extra-axial collection or mass lesion/mass effect. Atrophy and chronic microvascular ischemic  change again seen. Vascular: No hyperdense vessel or unexpected calcification. Skull: Intact.  No focal lesion. Sinuses/Orbits: Status post cataract surgery.  Otherwise negative. Other: None. CT CERVICAL SPINE FINDINGS Alignment: Maintained. Skull base and vertebrae: No acute fracture. No primary bone lesion or focal pathologic process. Soft tissues and spinal canal: No prevertebral fluid or swelling. No visible canal hematoma. Disc levels: Loss of disc space height and endplate spurring are most notable at C3-4, C5-6 and C6-7. Facet degenerative  disease is worst on the left at C4-5. Upper chest: Extensive emphysematous disease is seen. Lung apices are clear. Other: None. IMPRESSION: No acute abnormality head or cervical spine. Atrophy and chronic microvascular ischemic change. Mild appearing cervical spondylosis. Emphysema (ICD10-J43.9). Electronically Signed   By: Inge Rise M.D.   On: 03/04/2021 13:11   CT Cervical Spine Wo Contrast  Result Date: 03/04/2021 CLINICAL DATA:  Injury. EXAM: CT HEAD WITHOUT CONTRAST CT CERVICAL SPINE WITHOUT CONTRAST TECHNIQUE: Multidetector CT imaging of the head and cervical spine was performed following the standard protocol without intravenous contrast. Multiplanar CT image reconstructions of the cervical spine were also generated. COMPARISON:  Head and cervical spine CT scans 11/21/2020. FINDINGS: CT HEAD FINDINGS Brain: No evidence of acute infarction, hemorrhage, hydrocephalus, extra-axial collection or mass lesion/mass effect. Atrophy and chronic microvascular ischemic change again seen. Vascular: No hyperdense vessel or unexpected calcification. Skull: Intact.  No focal lesion. Sinuses/Orbits: Status post cataract surgery.  Otherwise negative. Other: None. CT CERVICAL SPINE FINDINGS Alignment: Maintained. Skull base and vertebrae: No acute fracture. No primary bone lesion or focal pathologic process. Soft tissues and spinal canal: No prevertebral fluid or swelling. No visible canal hematoma. Disc levels: Loss of disc space height and endplate spurring are most notable at C3-4, C5-6 and C6-7. Facet degenerative disease is worst on the left at C4-5. Upper chest: Extensive emphysematous disease is seen. Lung apices are clear. Other: None. IMPRESSION: No acute abnormality head or cervical spine. Atrophy and chronic microvascular ischemic change. Mild appearing cervical spondylosis. Emphysema (ICD10-J43.9). Electronically Signed   By: Inge Rise M.D.   On: 03/04/2021 13:11   Pelvis Portable  Result Date:  03/06/2021 CLINICAL DATA:  Left femoral neck fracture. EXAM: OPERATIVE left HIP (WITH PELVIS IF PERFORMED) 4 VIEWS TECHNIQUE: Fluoroscopic spot image(s) were submitted for interpretation post-operatively. COMPARISON:  Radiographs 03/04/2021 FINDINGS: Fluoroscopic spot images demonstrate placement of a bipolar left hip prosthesis. The femoral component is well seated. No complicating features. IMPRESSION: Bipolar left hip prosthesis and good position without complicating features. Electronically Signed   By: Marijo Sanes M.D.   On: 03/06/2021 11:58   DG CHEST PORT 1 VIEW  Result Date: 03/08/2021 CLINICAL DATA:  Acute respiratory failure with hypoxemia EXAM: PORTABLE CHEST 1 VIEW COMPARISON:  Portable exam 0832 hours compared to 03/04/2021 FINDINGS: Normal heart size and mediastinal contours. Pulmonary vascular congestion. Atherosclerotic calcification aorta. Interstitial infiltrates in both lungs increased since previous exam at the lower lungs, perhaps slightly improved in RIGHT upper lobe. These changes could reflect increased pulmonary edema or atypical infection. Component of underlying chronic interstitial lung disease is suspected. LEFT pleural effusion and bibasilar atelectasis noted. Bones demineralized. IMPRESSION: Increased interstitial infiltrates in both lungs favor pulmonary edema or less likely atypical infection. Small LEFT pleural effusion and bibasilar atelectasis. Aortic Atherosclerosis (ICD10-I70.0). Electronically Signed   By: Lavonia Dana M.D.   On: 03/08/2021 08:47   DG CHEST PORT 1 VIEW  Result Date: 03/04/2021 CLINICAL DATA:  Fall, femur fracture. EXAM: PORTABLE CHEST 1 VIEW  COMPARISON:  November 21, 2020. FINDINGS: The heart size and mediastinal contours are within normal limits. No pneumothorax is noted. Mild left basilar atelectasis is noted with small left pleural effusion. Stable scarring is noted throughout both lungs. The visualized skeletal structures are unremarkable.  IMPRESSION: Mild left basilar subsegmental atelectasis or small left pleural effusion. Aortic Atherosclerosis (ICD10-I70.0). Electronically Signed   By: Marijo Conception M.D.   On: 03/04/2021 16:24   DG C-Arm 1-60 Min  Result Date: 03/06/2021 CLINICAL DATA:  Left femoral neck fracture. EXAM: OPERATIVE left HIP (WITH PELVIS IF PERFORMED) 4 VIEWS TECHNIQUE: Fluoroscopic spot image(s) were submitted for interpretation post-operatively. COMPARISON:  Radiographs 03/04/2021 FINDINGS: Fluoroscopic spot images demonstrate placement of a bipolar left hip prosthesis. The femoral component is well seated. No complicating features. IMPRESSION: Bipolar left hip prosthesis and good position without complicating features. Electronically Signed   By: Marijo Sanes M.D.   On: 03/06/2021 11:58   DG HIP OPERATIVE UNILAT WITH PELVIS LEFT  Result Date: 03/06/2021 CLINICAL DATA:  Left femoral neck fracture. EXAM: OPERATIVE left HIP (WITH PELVIS IF PERFORMED) 4 VIEWS TECHNIQUE: Fluoroscopic spot image(s) were submitted for interpretation post-operatively. COMPARISON:  Radiographs 03/04/2021 FINDINGS: Fluoroscopic spot images demonstrate placement of a bipolar left hip prosthesis. The femoral component is well seated. No complicating features. IMPRESSION: Bipolar left hip prosthesis and good position without complicating features. Electronically Signed   By: Marijo Sanes M.D.   On: 03/06/2021 11:58   DG Hip Unilat With Pelvis 2-3 Views Left  Result Date: 03/04/2021 CLINICAL DATA:  Pain following fall EXAM: DG HIP (WITH OR WITHOUT PELVIS) 2-3V LEFT COMPARISON:  November 21, 2020 FINDINGS: Frontal pelvis as well as frontal and lateral left hip images were obtained. Bones are osteoporotic. There is a subcapital femoral neck fracture on the left with impaction at fracture site and mild varus angulation at the fracture site. No other fracture. No dislocation. Total hip replacement noted on the right. Mild narrowing left hip joint.  There is lower lumbar dextroscoliosis. There are foci of arterial vascular calcification in the pelvis. IMPRESSION: Subcapital femoral neck fracture on the left with impaction and varus angulation at fracture site. No other acute fracture. No dislocation. Mild narrowing left hip joint. Bones osteoporotic.  Status post total replacement on the right. Electronically Signed   By: Lowella Grip III M.D.   On: 03/04/2021 13:02     Subjective: Interactive but confused. Denies any shortness of breath or pain.   Discharge Exam: Vitals:   03/08/21 2136 03/09/21 0826  BP: 120/67 (!) 151/76  Pulse: 76 81  Resp: 15 17  Temp: (!) 97.5 F (36.4 C) 97.8 F (36.6 C)  SpO2: 93% 90%   General: Frail elderly female in no distress mouthreathing, SpO2 94% on 3L O2 consistently Cardiovascular: RRR, no edema or JVD Respiratory: Nonlabored, clear Abdominal: Soft, NT, ND, bowel sounds + Extremities: Left anterior hip dressing remains c/d/I with minimal tenderness to palpation. Copmartment soft, no ecchymosis or odor. Distal sensation and motor function appears intact though pt incompletely cooperative.  Labs: Basic Metabolic Panel: Recent Labs  Lab 03/04/21 1303 03/05/21 0435 03/07/21 0336 03/09/21 0237  NA 139 138 134* 136  K 3.8 4.1 4.9 4.9  CL 101 101 98 97*  CO2 27 31 30 31   GLUCOSE 137* 133* 135* 104*  BUN 18 18 23 20   CREATININE 0.74 0.75 0.78 0.59  CALCIUM 8.9 8.5* 8.3* 8.3*   CBC: Recent Labs  Lab 03/04/21 1303 03/05/21 0435 03/07/21  4098 03/09/21 0237  WBC 12.4* 9.4 12.4* 9.4  NEUTROABS 10.5*  --   --   --   HGB 13.7 11.9* 11.0* 10.5*  HCT 43.1 37.8 35.4* 33.4*  MCV 94.5 93.8 94.7 94.1  PLT 252 191 237 242   Microbiology Recent Results (from the past 240 hour(s))  Resp Panel by RT-PCR (Flu A&B, Covid) Nasopharyngeal Swab     Status: None   Collection Time: 03/04/21  1:05 PM   Specimen: Nasopharyngeal Swab; Nasopharyngeal(NP) swabs in vial transport medium  Result Value  Ref Range Status   SARS Coronavirus 2 by RT PCR NEGATIVE NEGATIVE Final    Comment: (NOTE) SARS-CoV-2 target nucleic acids are NOT DETECTED.  The SARS-CoV-2 RNA is generally detectable in upper respiratory specimens during the acute phase of infection. The lowest concentration of SARS-CoV-2 viral copies this assay can detect is 138 copies/mL. A negative result does not preclude SARS-Cov-2 infection and should not be used as the sole basis for treatment or other patient management decisions. A negative result may occur with  improper specimen collection/handling, submission of specimen other than nasopharyngeal swab, presence of viral mutation(s) within the areas targeted by this assay, and inadequate number of viral copies(<138 copies/mL). A negative result must be combined with clinical observations, patient history, and epidemiological information. The expected result is Negative.  Fact Sheet for Patients:  EntrepreneurPulse.com.au  Fact Sheet for Healthcare Providers:  IncredibleEmployment.be  This test is no t yet approved or cleared by the [place] FDA and  has been authorized for detection and/or diagnosis of SARS-CoV-2 by FDA under an Emergency Use Authorization (EUA). This EUA will remain  in effect (meaning this test can be used) for the duration of the COVID-19 declaration under Section 564(b)(1) of the Act, 21 U.S.C.section 360bbb-3(b)(1), unless the authorization is terminated  or revoked sooner.       Influenza A by PCR NEGATIVE NEGATIVE Final   Influenza B by PCR NEGATIVE NEGATIVE Final    Comment: (NOTE) The Xpert Xpress SARS-CoV-2/FLU/RSV plus assay is intended as an aid in the diagnosis of influenza from Nasopharyngeal swab specimens and should not be used as a sole basis for treatment. Nasal washings and aspirates are unacceptable for Xpert Xpress SARS-CoV-2/FLU/RSV testing.  Fact Sheet for  Patients: EntrepreneurPulse.com.au  Fact Sheet for Healthcare Providers: IncredibleEmployment.be  This test is not yet approved or cleared by the [place] FDA and has been authorized for detection and/or diagnosis of SARS-CoV-2 by FDA under an Emergency Use Authorization (EUA). This EUA will remain in effect (meaning this test can be used) for the duration of the COVID-19 declaration under Section 564(b)(1) of the Act, 21 U.S.C. section 360bbb-3(b)(1), unless the authorization is terminated or revoked.  Performed at [facility], [place] [address] [place], [place] [address]   Surgical pcr screen     Status: None   Collection Time: 03/04/21  5:41 PM   Specimen: Nasal Mucosa; Nasal Swab  Result Value Ref Range Status   MRSA, PCR NEGATIVE NEGATIVE Final   Staphylococcus aureus NEGATIVE NEGATIVE Final    Comment: (NOTE) The Xpert SA Assay (FDA approved for NASAL specimens in patients 35 years of age and older), is one component of a comprehensive surveillance program. It is not intended to diagnose infection nor to guide or monitor treatment. Performed at [facility], [place] [address] [place], [place] [address]     Time coordinating discharge: Approximately 40 minutes  Patrecia Pour, MD  [facility] 03/09/2021, 8:41 AM

## 2021-03-09 NOTE — Progress Notes (Signed)
    Subjective:  Patient is nonverbal at this time but is alert. Patient appears to be resting comfortably. She is able to follow commands.  Objective:   VITALS:   Vitals:   03/08/21 1523 03/08/21 2044 03/08/21 2136 03/09/21 0826  BP: 125/79  120/67 (!) 151/76  Pulse: 87  76 81  Resp: 18  15 17   Temp: 97.7 F (36.5 C)  (!) 97.5 F (36.4 C) 97.8 F (36.6 C)  TempSrc: Axillary  Oral Oral  SpO2: 94% 94% 93% 90%    NAD ABD soft Neurovascular intact Sensation intact distally Intact pulses distally Dorsiflexion/Plantar flexion intact Incision: dressing C/D/I   Lab Results  Component Value Date   WBC 9.4 03/09/2021   HGB 10.5 (L) 03/09/2021   HCT 33.4 (L) 03/09/2021   MCV 94.1 03/09/2021   PLT 242 03/09/2021   BMET    Component Value Date/Time   NA 136 03/09/2021 0237   NA 141 12/01/2015 0000   K 4.9 03/09/2021 0237   CL 97 (L) 03/09/2021 0237   CO2 31 03/09/2021 0237   GLUCOSE 104 (H) 03/09/2021 0237   BUN 20 03/09/2021 0237   BUN 17 12/01/2015 0000   CREATININE 0.59 03/09/2021 0237   CALCIUM 8.3 (L) 03/09/2021 0237   GFRNONAA >60 03/09/2021 0237   GFRAA >60 10/21/2016 0558     Assessment/Plan: 3 Days Post-Op   Active Problems:   Hypothyroidism   GERD without esophagitis   Dementia with behavioral disturbance (HCC)   Depression   Essential hypertension   COPD (chronic obstructive pulmonary disease) (HCC)   Chronic respiratory failure with hypoxia (HCC)   Closed left hip fracture, initial encounter (HCC)   Protein-calorie malnutrition, severe   WBAT with walker DVT ppx: Lovenox, SCDs, TEDS PO pain control PT/OT Dispo: D/C planning     Dorothyann Peng 03/09/2021, 12:34 PM   [facility] is now [employer] [address], [place], [place] [address] Phone: [phone] [email or web address] Facebook  [employer]
